# Patient Record
Sex: Female | Born: 1935 | Race: White | Hispanic: No | Marital: Married | Smoking: Former smoker
Health system: Southern US, Community
[De-identification: ages and names within clinical notes are randomized; demographics above are authoritative.]

## PROBLEM LIST (undated history)

## (undated) DIAGNOSIS — I5032 Chronic diastolic (congestive) heart failure: Secondary | ICD-10-CM

## (undated) DIAGNOSIS — Z8639 Personal history of other endocrine, nutritional and metabolic disease: Secondary | ICD-10-CM

## (undated) DIAGNOSIS — I714 Abdominal aortic aneurysm, without rupture, unspecified: Secondary | ICD-10-CM

## (undated) DIAGNOSIS — H353290 Exudative age-related macular degeneration, unspecified eye, stage unspecified: Secondary | ICD-10-CM

## (undated) DIAGNOSIS — L309 Dermatitis, unspecified: Secondary | ICD-10-CM

## (undated) DIAGNOSIS — E1340 Other specified diabetes mellitus with diabetic neuropathy, unspecified: Secondary | ICD-10-CM

## (undated) DIAGNOSIS — L84 Corns and callosities: Secondary | ICD-10-CM

## (undated) DIAGNOSIS — E213 Hyperparathyroidism, unspecified: Secondary | ICD-10-CM

## (undated) DIAGNOSIS — M109 Gout, unspecified: Secondary | ICD-10-CM

## (undated) DIAGNOSIS — B351 Tinea unguium: Secondary | ICD-10-CM

## (undated) DIAGNOSIS — I10 Essential (primary) hypertension: Secondary | ICD-10-CM

## (undated) DIAGNOSIS — M1990 Unspecified osteoarthritis, unspecified site: Secondary | ICD-10-CM

## (undated) DIAGNOSIS — K649 Unspecified hemorrhoids: Secondary | ICD-10-CM

## (undated) DIAGNOSIS — T466X5A Adverse effect of antihyperlipidemic and antiarteriosclerotic drugs, initial encounter: Secondary | ICD-10-CM

## (undated) DIAGNOSIS — G720 Drug-induced myopathy: Secondary | ICD-10-CM

## (undated) DIAGNOSIS — M545 Low back pain, unspecified: Secondary | ICD-10-CM

## (undated) DIAGNOSIS — K219 Gastro-esophageal reflux disease without esophagitis: Secondary | ICD-10-CM

## (undated) DIAGNOSIS — E7800 Pure hypercholesterolemia, unspecified: Secondary | ICD-10-CM

## (undated) DIAGNOSIS — K5790 Diverticulosis of intestine, part unspecified, without perforation or abscess without bleeding: Secondary | ICD-10-CM

## (undated) DIAGNOSIS — N1831 Chronic kidney disease, stage 3a: Secondary | ICD-10-CM

## (undated) DIAGNOSIS — H25019 Cortical age-related cataract, unspecified eye: Secondary | ICD-10-CM

## (undated) DIAGNOSIS — E039 Hypothyroidism, unspecified: Secondary | ICD-10-CM

## (undated) DIAGNOSIS — E119 Type 2 diabetes mellitus without complications: Secondary | ICD-10-CM

## (undated) DIAGNOSIS — E538 Deficiency of other specified B group vitamins: Secondary | ICD-10-CM

## (undated) DIAGNOSIS — Z8619 Personal history of other infectious and parasitic diseases: Secondary | ICD-10-CM

## (undated) DIAGNOSIS — C801 Malignant (primary) neoplasm, unspecified: Secondary | ICD-10-CM

## (undated) DIAGNOSIS — C4491 Basal cell carcinoma of skin, unspecified: Secondary | ICD-10-CM

## (undated) DIAGNOSIS — T7840XA Allergy, unspecified, initial encounter: Secondary | ICD-10-CM

## (undated) DIAGNOSIS — R32 Unspecified urinary incontinence: Secondary | ICD-10-CM

## (undated) DIAGNOSIS — D649 Anemia, unspecified: Secondary | ICD-10-CM

## (undated) DIAGNOSIS — E1140 Type 2 diabetes mellitus with diabetic neuropathy, unspecified: Secondary | ICD-10-CM

## (undated) HISTORY — DX: Chronic kidney disease, stage 3a: N18.31

## (undated) HISTORY — PX: KNEE ARTHROSCOPY: SUR90

## (undated) HISTORY — DX: Chronic diastolic (congestive) heart failure: I50.32

## (undated) HISTORY — DX: Abdominal aortic aneurysm, without rupture: I71.4

## (undated) HISTORY — DX: Abdominal aortic aneurysm, without rupture, unspecified: I71.40

## (undated) HISTORY — PX: DIAGNOSTIC LAPAROSCOPY: SUR761

## (undated) HISTORY — PX: MOUTH SURGERY: SHX715

## (undated) HISTORY — PX: EYE SURGERY: SHX253

## (undated) HISTORY — DX: Exudative age-related macular degeneration, unspecified eye, stage unspecified: H35.3290

## (undated) HISTORY — PX: OTHER SURGICAL HISTORY: SHX169

## (undated) HISTORY — PX: ESOPHAGOGASTRODUODENOSCOPY: SHX1529

## (undated) HISTORY — PX: TONSILLECTOMY: SUR1361

## (undated) HISTORY — PX: COLONOSCOPY: SHX174

---

## 1999-04-13 HISTORY — PX: PARATHYROIDECTOMY: SHX19

## 2002-04-12 DIAGNOSIS — G720 Drug-induced myopathy: Secondary | ICD-10-CM

## 2002-04-12 HISTORY — DX: Drug-induced myopathy: G72.0

## 2004-04-12 HISTORY — PX: BUNIONECTOMY: SHX129

## 2004-08-18 ENCOUNTER — Ambulatory Visit: Payer: Self-pay

## 2004-09-10 ENCOUNTER — Ambulatory Visit: Payer: Self-pay

## 2004-10-10 ENCOUNTER — Ambulatory Visit: Payer: Self-pay

## 2006-02-02 ENCOUNTER — Ambulatory Visit: Payer: Self-pay

## 2006-02-10 ENCOUNTER — Ambulatory Visit: Payer: Self-pay

## 2006-03-12 ENCOUNTER — Ambulatory Visit: Payer: Self-pay

## 2008-12-11 HISTORY — PX: MOHS SURGERY: SHX181

## 2008-12-18 ENCOUNTER — Ambulatory Visit: Payer: Self-pay | Admitting: *Deleted

## 2009-01-10 ENCOUNTER — Ambulatory Visit: Payer: Self-pay | Admitting: *Deleted

## 2009-02-10 ENCOUNTER — Ambulatory Visit: Payer: Self-pay | Admitting: *Deleted

## 2009-12-03 ENCOUNTER — Ambulatory Visit: Payer: Self-pay | Admitting: Ophthalmology

## 2010-03-02 ENCOUNTER — Ambulatory Visit: Payer: Self-pay | Admitting: Gastroenterology

## 2010-03-03 ENCOUNTER — Ambulatory Visit: Payer: Self-pay | Admitting: Gastroenterology

## 2010-03-03 LAB — PATHOLOGY REPORT

## 2010-03-10 ENCOUNTER — Ambulatory Visit: Payer: Self-pay | Admitting: Surgery

## 2010-03-11 ENCOUNTER — Ambulatory Visit: Payer: Self-pay | Admitting: Anesthesiology

## 2010-03-16 ENCOUNTER — Inpatient Hospital Stay: Payer: Self-pay | Admitting: Surgery

## 2010-03-16 HISTORY — PX: LAPAROSCOPIC PARTIAL COLECTOMY: SHX5907

## 2010-03-30 ENCOUNTER — Ambulatory Visit: Payer: Self-pay | Admitting: Oncology

## 2010-04-10 ENCOUNTER — Ambulatory Visit: Payer: Self-pay | Admitting: Surgery

## 2010-04-12 ENCOUNTER — Ambulatory Visit: Payer: Self-pay | Admitting: Oncology

## 2010-04-17 ENCOUNTER — Emergency Department: Payer: Self-pay | Admitting: Unknown Physician Specialty

## 2010-04-19 ENCOUNTER — Ambulatory Visit: Payer: Self-pay | Admitting: Oncology

## 2010-05-13 ENCOUNTER — Ambulatory Visit: Payer: Self-pay | Admitting: Oncology

## 2010-06-11 ENCOUNTER — Ambulatory Visit: Payer: Self-pay | Admitting: Oncology

## 2010-07-12 ENCOUNTER — Ambulatory Visit: Payer: Self-pay | Admitting: Oncology

## 2010-08-04 LAB — CEA: CEA: 5.1 ng/mL — ABNORMAL HIGH (ref 0.0–4.7)

## 2010-08-11 ENCOUNTER — Ambulatory Visit: Payer: Self-pay | Admitting: Oncology

## 2010-09-11 ENCOUNTER — Ambulatory Visit: Payer: Self-pay | Admitting: Oncology

## 2010-10-11 ENCOUNTER — Ambulatory Visit: Payer: Self-pay | Admitting: Oncology

## 2010-11-11 ENCOUNTER — Ambulatory Visit: Payer: Self-pay | Admitting: Oncology

## 2010-12-12 ENCOUNTER — Ambulatory Visit: Payer: Self-pay | Admitting: Oncology

## 2011-01-13 ENCOUNTER — Ambulatory Visit: Payer: Self-pay | Admitting: Internal Medicine

## 2011-01-14 ENCOUNTER — Ambulatory Visit: Payer: Self-pay | Admitting: Oncology

## 2011-02-11 ENCOUNTER — Ambulatory Visit: Payer: Self-pay | Admitting: Oncology

## 2011-02-18 LAB — CEA: CEA: 4.3 ng/mL (ref 0.0–4.7)

## 2011-03-02 IMAGING — CR DG FEMUR 2V*L*
1 series · 4 of 4 positions shown · non-contrast
Comparison: none

REASON FOR EXAM: pain left leg
COMMENTS:

[Series 1: view not recorded · 0.17mm/px · 4 of 4 slices shown]
[im 1/4]
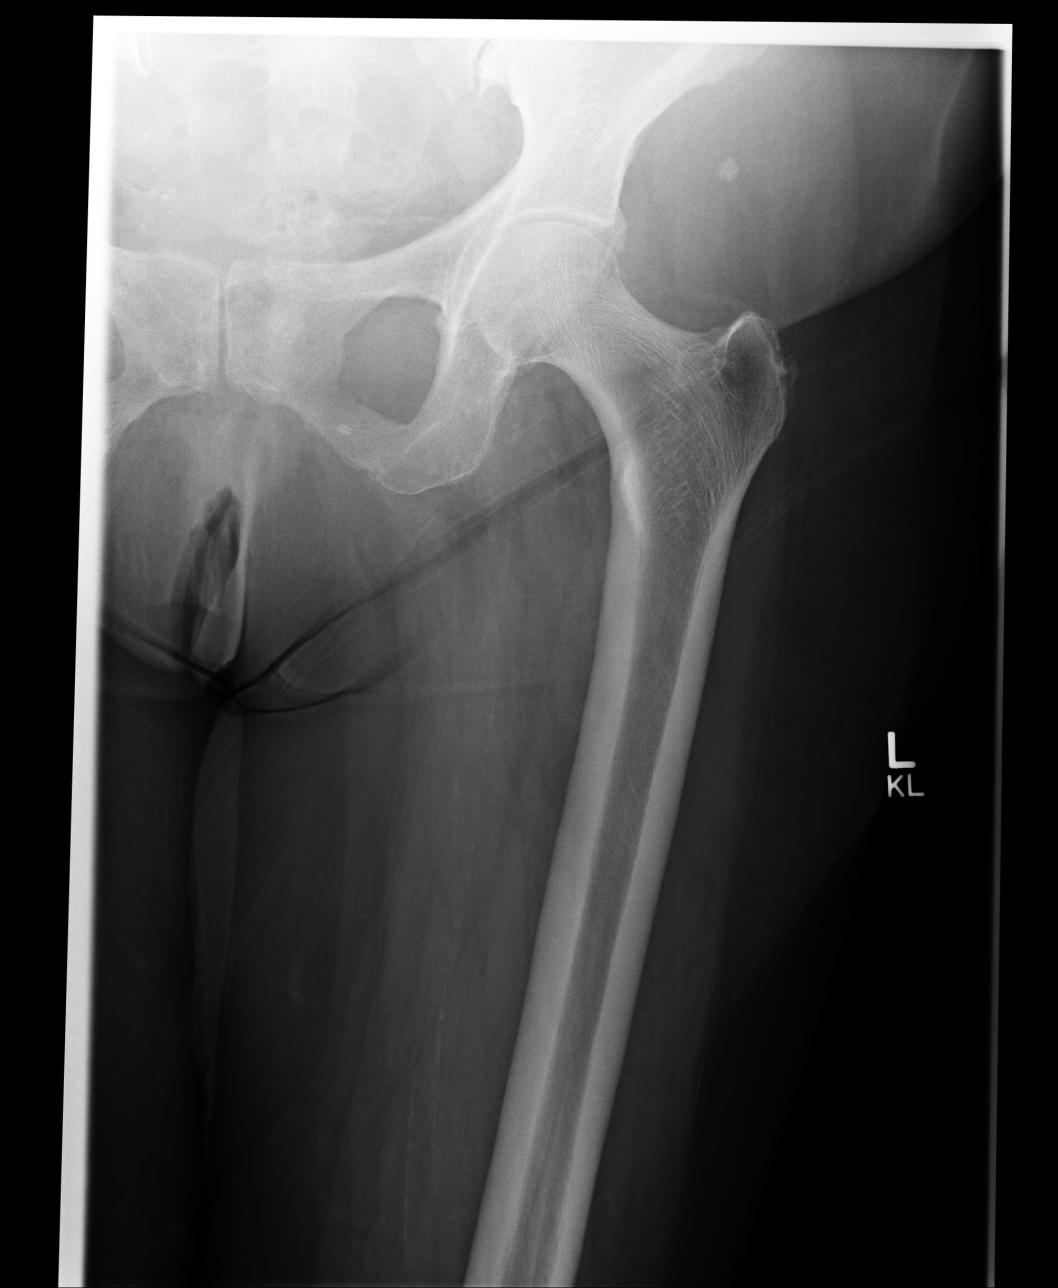
[im 2/4]
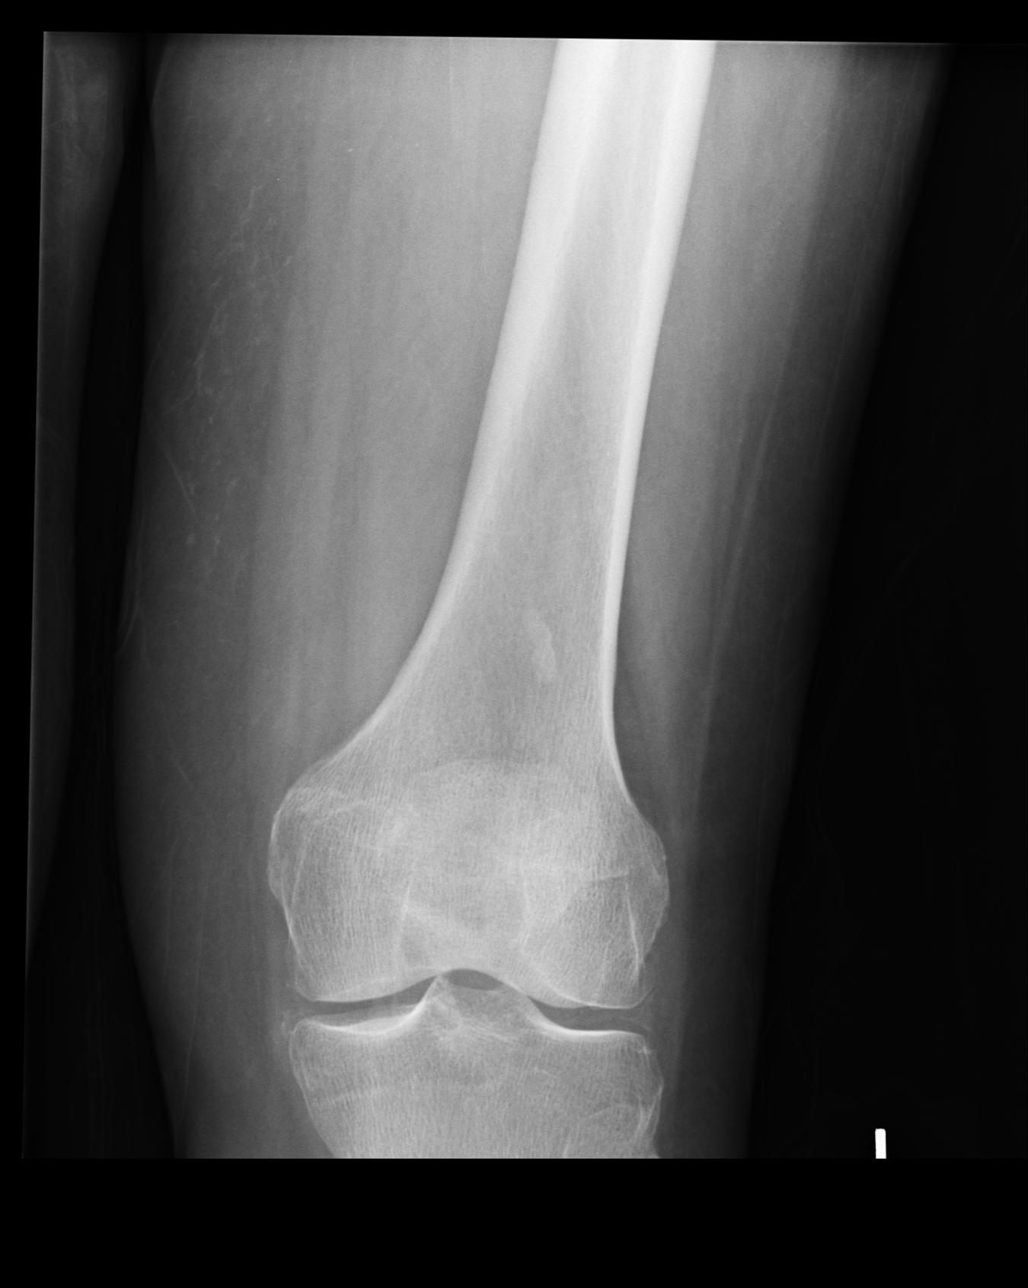
[im 3/4]
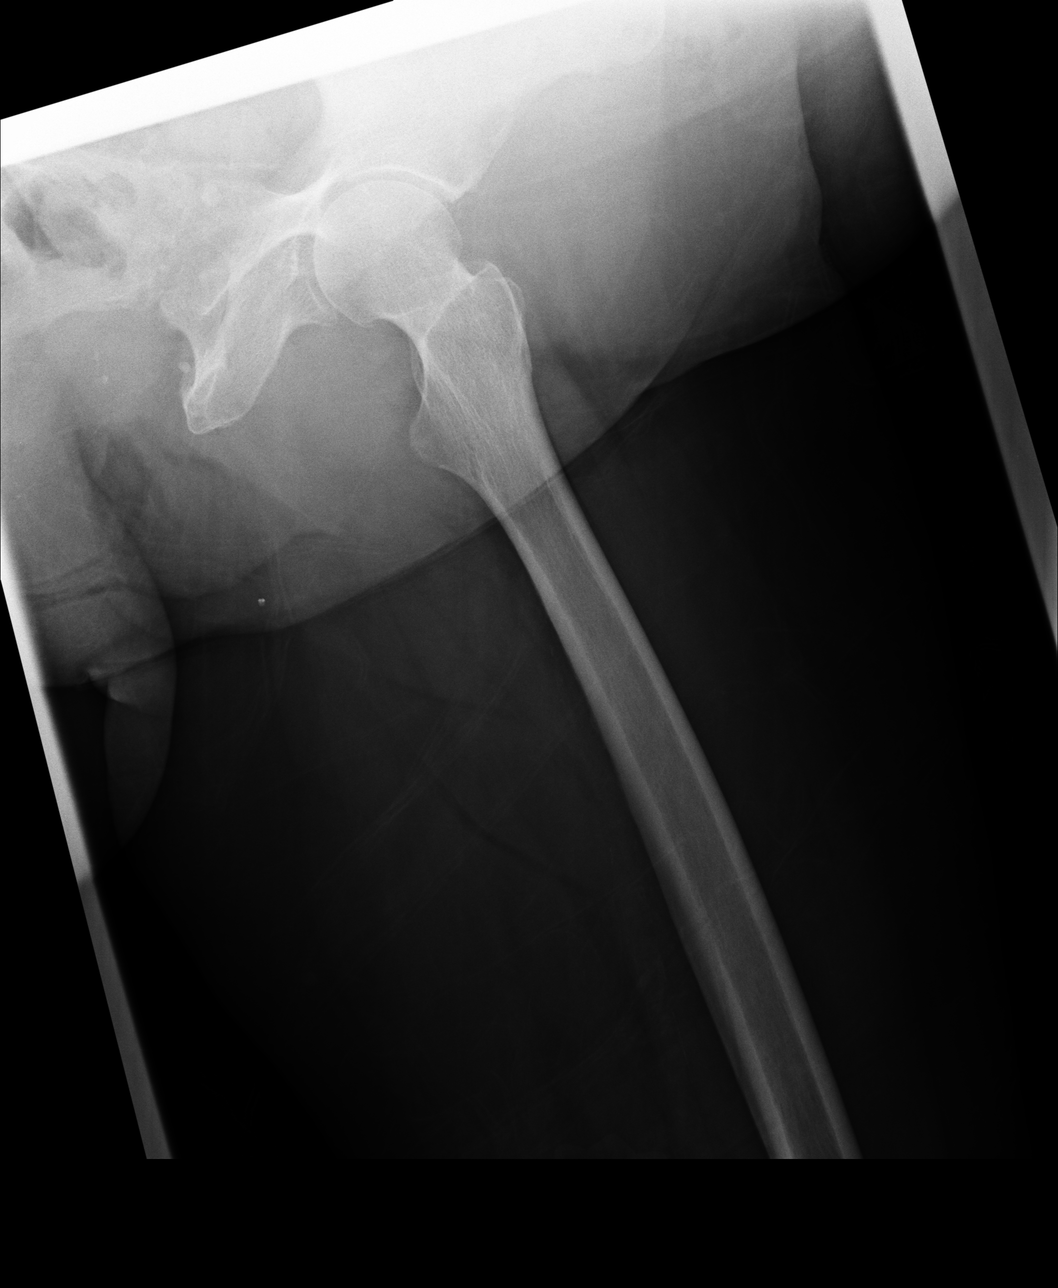
[im 4/4]
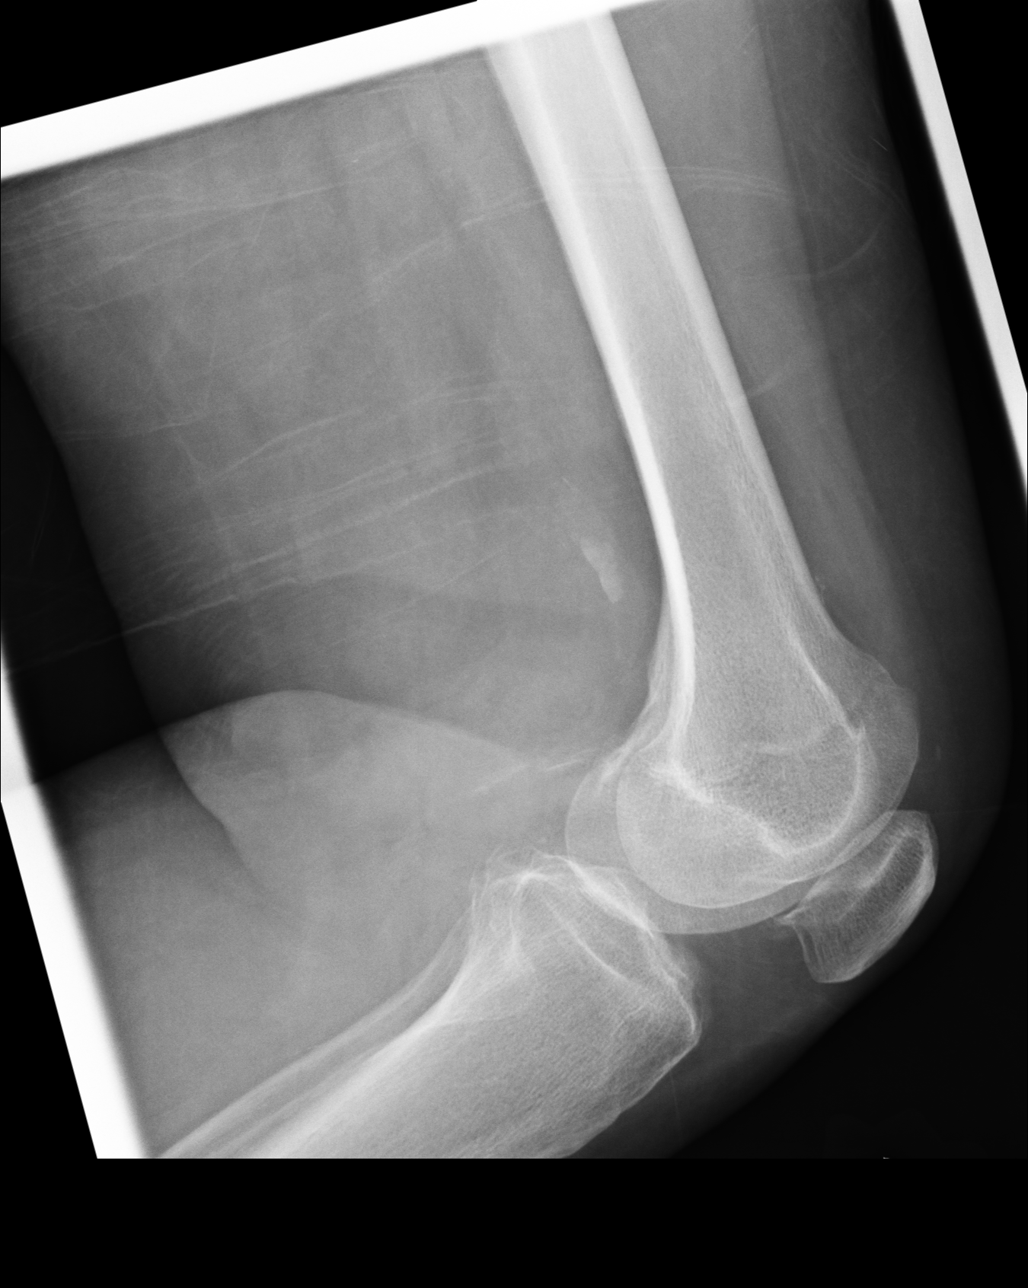

[4 of 4 positions shown; findings below may reference images not displayed]

PROCEDURE:     DXR - DXR FEMUR LEFT  - April 17, 2010 [DATE]

RESULT:     Images of the left demonstrate lucency in the supracondylar
region of the distal femoral shaft consistent with a nondisplaced and
non-comminuted-fracture. Orthopedic surgical followup is recommended. The
patella appears intact.
IMPRESSION: Lucency demonstrated in the distal femur consistent with a
nondisplaced fracture.(*)

## 2011-03-13 ENCOUNTER — Ambulatory Visit: Payer: Self-pay | Admitting: Oncology

## 2011-04-13 ENCOUNTER — Ambulatory Visit: Payer: Self-pay | Admitting: Oncology

## 2011-05-13 ENCOUNTER — Other Ambulatory Visit: Payer: Self-pay | Admitting: Gastroenterology

## 2011-05-14 ENCOUNTER — Ambulatory Visit: Payer: Self-pay | Admitting: Oncology

## 2011-05-14 LAB — WBCS, STOOL

## 2011-05-15 LAB — STOOL CULTURE

## 2011-05-18 LAB — COMPREHENSIVE METABOLIC PANEL
Albumin: 3.7 g/dL (ref 3.4–5.0)
BUN: 19 mg/dL — ABNORMAL HIGH (ref 7–18)
Bilirubin,Total: 0.5 mg/dL (ref 0.2–1.0)
Calcium, Total: 8.9 mg/dL (ref 8.5–10.1)
Co2: 31 mmol/L (ref 21–32)
Creatinine: 1.16 mg/dL (ref 0.60–1.30)
EGFR (African American): 58 — ABNORMAL LOW
EGFR (Non-African Amer.): 48 — ABNORMAL LOW
Glucose: 194 mg/dL — ABNORMAL HIGH (ref 65–99)
Osmolality: 289 (ref 275–301)
SGPT (ALT): 21 U/L

## 2011-05-18 LAB — CBC CANCER CENTER
Basophil #: 0 x10 3/mm (ref 0.0–0.1)
Eosinophil #: 0.3 x10 3/mm (ref 0.0–0.7)
HCT: 39.6 % (ref 35.0–47.0)
Lymphocyte %: 21.2 %
MCH: 29.9 pg (ref 26.0–34.0)
MCHC: 33.2 g/dL (ref 32.0–36.0)
MCV: 90 fL (ref 80–100)
Monocyte #: 0.4 x10 3/mm (ref 0.0–0.7)
Neutrophil #: 5.3 x10 3/mm (ref 1.4–6.5)
Platelet: 130 x10 3/mm — ABNORMAL LOW (ref 150–440)
RDW: 13.1 % (ref 11.5–14.5)

## 2011-05-18 LAB — MAGNESIUM: Magnesium: 1.4 mg/dL — ABNORMAL LOW

## 2011-06-11 ENCOUNTER — Ambulatory Visit: Payer: Self-pay | Admitting: Oncology

## 2011-06-29 ENCOUNTER — Ambulatory Visit: Payer: Self-pay | Admitting: Gastroenterology

## 2011-07-01 LAB — PATHOLOGY REPORT

## 2011-07-12 ENCOUNTER — Ambulatory Visit: Payer: Self-pay | Admitting: Oncology

## 2011-08-12 ENCOUNTER — Ambulatory Visit: Payer: Self-pay | Admitting: Oncology

## 2011-08-17 LAB — BASIC METABOLIC PANEL
Calcium, Total: 8.8 mg/dL (ref 8.5–10.1)
Co2: 28 mmol/L (ref 21–32)
EGFR (African American): 55 — ABNORMAL LOW
EGFR (Non-African Amer.): 48 — ABNORMAL LOW
Glucose: 191 mg/dL — ABNORMAL HIGH (ref 65–99)
Potassium: 4.3 mmol/L (ref 3.5–5.1)
Sodium: 143 mmol/L (ref 136–145)

## 2011-08-17 LAB — CBC CANCER CENTER
Basophil #: 0 x10 3/mm (ref 0.0–0.1)
Basophil %: 0.5 %
Eosinophil #: 0.4 x10 3/mm (ref 0.0–0.7)
Eosinophil %: 4.2 %
HCT: 42 % (ref 35.0–47.0)
Lymphocyte #: 1.9 x10 3/mm (ref 1.0–3.6)
Lymphocyte %: 21.3 %
MCH: 29.4 pg (ref 26.0–34.0)
MCV: 92 fL (ref 80–100)
Neutrophil #: 6.1 x10 3/mm (ref 1.4–6.5)
Neutrophil %: 68.9 %
Platelet: 130 x10 3/mm — ABNORMAL LOW (ref 150–440)
RDW: 13.1 % (ref 11.5–14.5)
WBC: 8.8 x10 3/mm (ref 3.6–11.0)

## 2011-08-18 LAB — CEA: CEA: 3 ng/mL (ref 0.0–4.7)

## 2011-09-11 ENCOUNTER — Ambulatory Visit: Payer: Self-pay | Admitting: Oncology

## 2011-10-13 ENCOUNTER — Ambulatory Visit: Payer: Self-pay | Admitting: Oncology

## 2011-11-11 ENCOUNTER — Ambulatory Visit: Payer: Self-pay | Admitting: Oncology

## 2011-11-30 LAB — COMPREHENSIVE METABOLIC PANEL
Alkaline Phosphatase: 109 U/L (ref 50–136)
Bilirubin,Total: 0.5 mg/dL (ref 0.2–1.0)
Calcium, Total: 8.8 mg/dL (ref 8.5–10.1)
Chloride: 103 mmol/L (ref 98–107)
Co2: 28 mmol/L (ref 21–32)
EGFR (African American): 56 — ABNORMAL LOW
EGFR (Non-African Amer.): 48 — ABNORMAL LOW
Glucose: 107 mg/dL — ABNORMAL HIGH (ref 65–99)
Potassium: 4.4 mmol/L (ref 3.5–5.1)
SGOT(AST): 27 U/L (ref 15–37)
SGPT (ALT): 25 U/L (ref 12–78)

## 2011-11-30 LAB — CBC CANCER CENTER
Basophil %: 0.3 %
Eosinophil %: 3.5 %
HCT: 40 % (ref 35.0–47.0)
HGB: 13 g/dL (ref 12.0–16.0)
Lymphocyte #: 2.1 x10 3/mm (ref 1.0–3.6)
Lymphocyte %: 21.4 %
MCV: 92 fL (ref 80–100)
Monocyte %: 6.1 %
Neutrophil #: 6.6 x10 3/mm — ABNORMAL HIGH (ref 1.4–6.5)
Platelet: 126 x10 3/mm — ABNORMAL LOW (ref 150–440)
WBC: 9.6 x10 3/mm (ref 3.6–11.0)

## 2011-12-01 LAB — CEA: CEA: 2.6 ng/mL (ref 0.0–4.7)

## 2011-12-06 DIAGNOSIS — Z85828 Personal history of other malignant neoplasm of skin: Secondary | ICD-10-CM | POA: Insufficient documentation

## 2011-12-12 ENCOUNTER — Ambulatory Visit: Payer: Self-pay | Admitting: Oncology

## 2012-05-31 ENCOUNTER — Ambulatory Visit: Payer: Self-pay | Admitting: Oncology

## 2012-06-05 LAB — CBC CANCER CENTER
Basophil %: 0.7 %
Eosinophil %: 3.7 %
HGB: 12.6 g/dL (ref 12.0–16.0)
MCH: 29.8 pg (ref 26.0–34.0)
Monocyte #: 0.4 x10 3/mm (ref 0.2–0.9)
Neutrophil #: 5.7 x10 3/mm (ref 1.4–6.5)
Neutrophil %: 69.4 %
WBC: 8.3 x10 3/mm (ref 3.6–11.0)

## 2012-06-05 LAB — COMPREHENSIVE METABOLIC PANEL
Albumin: 3.5 g/dL (ref 3.4–5.0)
Alkaline Phosphatase: 112 U/L (ref 50–136)
Anion Gap: 7 (ref 7–16)
BUN: 27 mg/dL — ABNORMAL HIGH (ref 7–18)
EGFR (African American): 48 — ABNORMAL LOW
Glucose: 321 mg/dL — ABNORMAL HIGH (ref 65–99)
Osmolality: 295 (ref 275–301)
Potassium: 5.1 mmol/L (ref 3.5–5.1)
SGPT (ALT): 25 U/L (ref 12–78)
Sodium: 139 mmol/L (ref 136–145)

## 2012-06-05 LAB — MAGNESIUM: Magnesium: 1.6 mg/dL — ABNORMAL LOW

## 2012-06-06 LAB — CEA: CEA: 3.1 ng/mL (ref 0.0–4.7)

## 2012-06-10 ENCOUNTER — Ambulatory Visit: Payer: Self-pay | Admitting: Oncology

## 2012-07-05 ENCOUNTER — Ambulatory Visit: Payer: Self-pay | Admitting: Internal Medicine

## 2012-07-10 ENCOUNTER — Ambulatory Visit: Payer: Self-pay | Admitting: Internal Medicine

## 2012-10-14 ENCOUNTER — Other Ambulatory Visit: Payer: Self-pay | Admitting: Gastroenterology

## 2012-11-06 ENCOUNTER — Ambulatory Visit: Payer: Self-pay | Admitting: Gastroenterology

## 2012-11-30 ENCOUNTER — Ambulatory Visit: Payer: Self-pay | Admitting: Oncology

## 2012-12-01 LAB — CBC CANCER CENTER
Basophil %: 0.7 %
HCT: 38.4 % (ref 35.0–47.0)
HGB: 13 g/dL (ref 12.0–16.0)
Lymphocyte #: 1.5 x10 3/mm (ref 1.0–3.6)
MCH: 30.3 pg (ref 26.0–34.0)
MCHC: 33.9 g/dL (ref 32.0–36.0)
MCV: 89 fL (ref 80–100)
Monocyte %: 4.6 %
Neutrophil #: 6.1 x10 3/mm (ref 1.4–6.5)
RDW: 13.1 % (ref 11.5–14.5)
WBC: 8.4 x10 3/mm (ref 3.6–11.0)

## 2012-12-01 LAB — COMPREHENSIVE METABOLIC PANEL
Albumin: 3.6 g/dL (ref 3.4–5.0)
Calcium, Total: 8.6 mg/dL (ref 8.5–10.1)
Chloride: 104 mmol/L (ref 98–107)
Co2: 25 mmol/L (ref 21–32)
Glucose: 253 mg/dL — ABNORMAL HIGH (ref 65–99)
Osmolality: 291 (ref 275–301)
Potassium: 4.5 mmol/L (ref 3.5–5.1)
SGOT(AST): 22 U/L (ref 15–37)
Total Protein: 7 g/dL (ref 6.4–8.2)

## 2012-12-02 LAB — CEA: CEA: 2.7 ng/mL (ref 0.0–4.7)

## 2012-12-11 ENCOUNTER — Ambulatory Visit: Payer: Self-pay | Admitting: Oncology

## 2013-06-11 ENCOUNTER — Ambulatory Visit: Payer: Self-pay | Admitting: Oncology

## 2013-06-11 LAB — CBC CANCER CENTER
BASOS ABS: 0.1 x10 3/mm (ref 0.0–0.1)
BASOS PCT: 0.9 %
Eosinophil #: 0.2 x10 3/mm (ref 0.0–0.7)
Eosinophil %: 2.6 %
HCT: 38.6 % (ref 35.0–47.0)
HGB: 12 g/dL (ref 12.0–16.0)
LYMPHS PCT: 18.5 %
Lymphocyte #: 1.4 x10 3/mm (ref 1.0–3.6)
MCH: 27.6 pg (ref 26.0–34.0)
MCHC: 31.2 g/dL — ABNORMAL LOW (ref 32.0–36.0)
MCV: 88 fL (ref 80–100)
MONO ABS: 0.4 x10 3/mm (ref 0.2–0.9)
MONOS PCT: 4.6 %
NEUTROS PCT: 73.4 %
Neutrophil #: 5.6 x10 3/mm (ref 1.4–6.5)
Platelet: 150 x10 3/mm (ref 150–440)
RBC: 4.36 10*6/uL (ref 3.80–5.20)
RDW: 13.2 % (ref 11.5–14.5)
WBC: 7.7 x10 3/mm (ref 3.6–11.0)

## 2013-06-11 LAB — MAGNESIUM: Magnesium: 2 mg/dL

## 2013-06-22 LAB — CEA: CEA: 3.1 ng/mL (ref 0.0–4.7)

## 2013-07-11 ENCOUNTER — Ambulatory Visit: Payer: Self-pay | Admitting: Oncology

## 2013-12-07 ENCOUNTER — Ambulatory Visit: Payer: Self-pay | Admitting: Oncology

## 2013-12-07 LAB — CBC CANCER CENTER
BASOS ABS: 0.1 x10 3/mm (ref 0.0–0.1)
Basophil %: 1.1 %
Eosinophil #: 0.2 x10 3/mm (ref 0.0–0.7)
Eosinophil %: 2.4 %
HCT: 37.6 % (ref 35.0–47.0)
HGB: 12.1 g/dL (ref 12.0–16.0)
Lymphocyte #: 1.4 x10 3/mm (ref 1.0–3.6)
Lymphocyte %: 17 %
MCH: 28 pg (ref 26.0–34.0)
MCHC: 32.1 g/dL (ref 32.0–36.0)
MCV: 87 fL (ref 80–100)
MONO ABS: 0.3 x10 3/mm (ref 0.2–0.9)
Monocyte %: 4 %
NEUTROS PCT: 75.5 %
Neutrophil #: 6.4 x10 3/mm (ref 1.4–6.5)
Platelet: 138 x10 3/mm — ABNORMAL LOW (ref 150–440)
RBC: 4.3 10*6/uL (ref 3.80–5.20)
RDW: 13.6 % (ref 11.5–14.5)
WBC: 8.5 x10 3/mm (ref 3.6–11.0)

## 2013-12-07 LAB — COMPREHENSIVE METABOLIC PANEL
ALBUMIN: 3.5 g/dL (ref 3.4–5.0)
ALT: 23 U/L
ANION GAP: 12 (ref 7–16)
Alkaline Phosphatase: 108 U/L
BILIRUBIN TOTAL: 0.5 mg/dL (ref 0.2–1.0)
BUN: 16 mg/dL (ref 7–18)
CO2: 26 mmol/L (ref 21–32)
CREATININE: 1.24 mg/dL (ref 0.60–1.30)
Calcium, Total: 8.5 mg/dL (ref 8.5–10.1)
Chloride: 103 mmol/L (ref 98–107)
EGFR (African American): 48 — ABNORMAL LOW
GFR CALC NON AF AMER: 41 — AB
Glucose: 242 mg/dL — ABNORMAL HIGH (ref 65–99)
Osmolality: 290 (ref 275–301)
POTASSIUM: 4.3 mmol/L (ref 3.5–5.1)
SGOT(AST): 28 U/L (ref 15–37)
SODIUM: 141 mmol/L (ref 136–145)
Total Protein: 7.1 g/dL (ref 6.4–8.2)

## 2013-12-07 LAB — MAGNESIUM: Magnesium: 1.4 mg/dL — ABNORMAL LOW

## 2013-12-10 LAB — CEA: CEA: 2.9 ng/mL (ref 0.0–4.7)

## 2013-12-11 ENCOUNTER — Ambulatory Visit: Payer: Self-pay | Admitting: Oncology

## 2013-12-20 DIAGNOSIS — Z85038 Personal history of other malignant neoplasm of large intestine: Secondary | ICD-10-CM | POA: Insufficient documentation

## 2013-12-20 DIAGNOSIS — K529 Noninfective gastroenteritis and colitis, unspecified: Secondary | ICD-10-CM | POA: Insufficient documentation

## 2014-01-02 DIAGNOSIS — I10 Essential (primary) hypertension: Secondary | ICD-10-CM | POA: Insufficient documentation

## 2014-01-02 DIAGNOSIS — E7800 Pure hypercholesterolemia, unspecified: Secondary | ICD-10-CM | POA: Insufficient documentation

## 2014-01-02 DIAGNOSIS — M545 Low back pain, unspecified: Secondary | ICD-10-CM | POA: Insufficient documentation

## 2014-06-07 ENCOUNTER — Ambulatory Visit: Payer: Self-pay | Admitting: Oncology

## 2014-06-11 ENCOUNTER — Ambulatory Visit
Admit: 2014-06-11 | Disposition: A | Discharge status: Home / Self Care | Payer: Self-pay | Attending: Oncology | Admitting: Oncology

## 2014-08-02 DIAGNOSIS — E1149 Type 2 diabetes mellitus with other diabetic neurological complication: Secondary | ICD-10-CM | POA: Insufficient documentation

## 2014-08-02 DIAGNOSIS — E1159 Type 2 diabetes mellitus with other circulatory complications: Secondary | ICD-10-CM | POA: Insufficient documentation

## 2014-08-07 ENCOUNTER — Ambulatory Visit
Admit: 2014-08-07 | Disposition: A | Discharge status: Home / Self Care | Payer: Self-pay | Attending: Internal Medicine | Admitting: Internal Medicine

## 2014-11-29 ENCOUNTER — Telehealth: Payer: Self-pay | Admitting: *Deleted

## 2014-11-29 DIAGNOSIS — R6889 Other general symptoms and signs: Secondary | ICD-10-CM

## 2014-11-29 DIAGNOSIS — C189 Malignant neoplasm of colon, unspecified: Secondary | ICD-10-CM

## 2014-11-29 NOTE — Telephone Encounter (Signed)
Has a signed order from Dr Gilford Rile for [facility] to be drawn when she has labs drawn here on 31st. Checked with Dr Grayland Ormond and TSH has been added to labs we have ordered

## 2014-12-06 ENCOUNTER — Encounter: Payer: Self-pay | Admitting: *Deleted

## 2014-12-09 ENCOUNTER — Encounter
Admission: RE | Disposition: A | Discharge status: Home / Self Care | Payer: Self-pay | Source: Ambulatory Visit | Attending: Gastroenterology

## 2014-12-09 ENCOUNTER — Ambulatory Visit: Payer: Medicare Other | Admitting: Anesthesiology

## 2014-12-09 ENCOUNTER — Encounter: Payer: Self-pay | Admitting: *Deleted

## 2014-12-09 ENCOUNTER — Ambulatory Visit
Admission: RE | Admit: 2014-12-09 | Discharge: 2014-12-09 | Disposition: A | Discharge status: Home / Self Care | Payer: Medicare Other | Source: Ambulatory Visit | Attending: Gastroenterology | Admitting: Gastroenterology

## 2014-12-09 DIAGNOSIS — Z6831 Body mass index (BMI) 31.0-31.9, adult: Secondary | ICD-10-CM | POA: Insufficient documentation

## 2014-12-09 DIAGNOSIS — E213 Hyperparathyroidism, unspecified: Secondary | ICD-10-CM | POA: Diagnosis not present

## 2014-12-09 DIAGNOSIS — E669 Obesity, unspecified: Secondary | ICD-10-CM | POA: Diagnosis not present

## 2014-12-09 DIAGNOSIS — Z880 Allergy status to penicillin: Secondary | ICD-10-CM | POA: Insufficient documentation

## 2014-12-09 DIAGNOSIS — Z881 Allergy status to other antibiotic agents status: Secondary | ICD-10-CM | POA: Diagnosis not present

## 2014-12-09 DIAGNOSIS — D122 Benign neoplasm of ascending colon: Secondary | ICD-10-CM | POA: Diagnosis not present

## 2014-12-09 DIAGNOSIS — Z85038 Personal history of other malignant neoplasm of large intestine: Secondary | ICD-10-CM | POA: Insufficient documentation

## 2014-12-09 DIAGNOSIS — M109 Gout, unspecified: Secondary | ICD-10-CM | POA: Diagnosis not present

## 2014-12-09 DIAGNOSIS — D649 Anemia, unspecified: Secondary | ICD-10-CM | POA: Insufficient documentation

## 2014-12-09 DIAGNOSIS — Z1211 Encounter for screening for malignant neoplasm of colon: Secondary | ICD-10-CM | POA: Diagnosis not present

## 2014-12-09 DIAGNOSIS — K5289 Other specified noninfective gastroenteritis and colitis: Secondary | ICD-10-CM | POA: Diagnosis not present

## 2014-12-09 DIAGNOSIS — Z888 Allergy status to other drugs, medicaments and biological substances status: Secondary | ICD-10-CM | POA: Diagnosis not present

## 2014-12-09 DIAGNOSIS — M1990 Unspecified osteoarthritis, unspecified site: Secondary | ICD-10-CM | POA: Diagnosis not present

## 2014-12-09 DIAGNOSIS — I10 Essential (primary) hypertension: Secondary | ICD-10-CM | POA: Insufficient documentation

## 2014-12-09 DIAGNOSIS — E1140 Type 2 diabetes mellitus with diabetic neuropathy, unspecified: Secondary | ICD-10-CM | POA: Insufficient documentation

## 2014-12-09 DIAGNOSIS — D123 Benign neoplasm of transverse colon: Secondary | ICD-10-CM | POA: Diagnosis not present

## 2014-12-09 DIAGNOSIS — K5730 Diverticulosis of large intestine without perforation or abscess without bleeding: Secondary | ICD-10-CM | POA: Diagnosis not present

## 2014-12-09 DIAGNOSIS — K219 Gastro-esophageal reflux disease without esophagitis: Secondary | ICD-10-CM | POA: Diagnosis not present

## 2014-12-09 DIAGNOSIS — K648 Other hemorrhoids: Secondary | ICD-10-CM | POA: Insufficient documentation

## 2014-12-09 DIAGNOSIS — E039 Hypothyroidism, unspecified: Secondary | ICD-10-CM | POA: Insufficient documentation

## 2014-12-09 DIAGNOSIS — E780 Pure hypercholesterolemia: Secondary | ICD-10-CM | POA: Diagnosis not present

## 2014-12-09 DIAGNOSIS — Z794 Long term (current) use of insulin: Secondary | ICD-10-CM | POA: Insufficient documentation

## 2014-12-09 DIAGNOSIS — Z86010 Personal history of colonic polyps: Secondary | ICD-10-CM | POA: Insufficient documentation

## 2014-12-09 HISTORY — DX: Low back pain, unspecified: M54.50

## 2014-12-09 HISTORY — DX: Unspecified urinary incontinence: R32

## 2014-12-09 HISTORY — DX: Low back pain: M54.5

## 2014-12-09 HISTORY — DX: Anemia, unspecified: D64.9

## 2014-12-09 HISTORY — DX: Gout, unspecified: M10.9

## 2014-12-09 HISTORY — DX: Other specified diabetes mellitus with diabetic neuropathy, unspecified: E13.40

## 2014-12-09 HISTORY — DX: Tinea unguium: B35.1

## 2014-12-09 HISTORY — DX: Pure hypercholesterolemia, unspecified: E78.00

## 2014-12-09 HISTORY — DX: Hyperparathyroidism, unspecified: E21.3

## 2014-12-09 HISTORY — DX: Type 2 diabetes mellitus without complications: E11.9

## 2014-12-09 HISTORY — DX: Unspecified osteoarthritis, unspecified site: M19.90

## 2014-12-09 HISTORY — DX: Hypothyroidism, unspecified: E03.9

## 2014-12-09 HISTORY — DX: Drug-induced myopathy: G72.0

## 2014-12-09 HISTORY — DX: Malignant (primary) neoplasm, unspecified: C80.1

## 2014-12-09 HISTORY — DX: Dermatitis, unspecified: L30.9

## 2014-12-09 HISTORY — DX: Gastro-esophageal reflux disease without esophagitis: K21.9

## 2014-12-09 HISTORY — PX: COLONOSCOPY WITH PROPOFOL: SHX5780

## 2014-12-09 HISTORY — DX: Unspecified hemorrhoids: K64.9

## 2014-12-09 LAB — GLUCOSE, CAPILLARY: Glucose-Capillary: 96 mg/dL (ref 65–99)

## 2014-12-09 SURGERY — COLONOSCOPY WITH PROPOFOL
Anesthesia: General

## 2014-12-09 MED ORDER — PROPOFOL 10 MG/ML IV BOLUS
INTRAVENOUS | Status: DC | PRN
  Administered 2014-12-09: 20 mg via INTRAVENOUS
  Administered 2014-12-09: 10 mg via INTRAVENOUS
  Administered 2014-12-09: 20 mg via INTRAVENOUS

## 2014-12-09 MED ORDER — CARVEDILOL 6.25 MG PO TABS
ORAL_TABLET | ORAL | Status: AC
  Administered 2014-12-09: 6.25 mg
  Filled 2014-12-09: qty 1

## 2014-12-09 MED ORDER — FENTANYL CITRATE (PF) 100 MCG/2ML IJ SOLN
INTRAMUSCULAR | Status: DC | PRN
  Administered 2014-12-09 (×2): 25 ug via INTRAVENOUS

## 2014-12-09 MED ORDER — SODIUM CHLORIDE 0.9 % IV SOLN
INTRAVENOUS | Status: DC
Start: 2014-12-09 — End: 2014-12-09

## 2014-12-09 MED ORDER — SODIUM CHLORIDE 0.9 % IV SOLN
INTRAVENOUS | Status: DC
  Administered 2014-12-09: 10:00:00 via INTRAVENOUS
  Administered 2014-12-09: 1000 mL via INTRAVENOUS

## 2014-12-09 MED ORDER — PROPOFOL INFUSION 10 MG/ML OPTIME
INTRAVENOUS | Status: DC | PRN
  Administered 2014-12-09: 100 ug/kg/min via INTRAVENOUS

## 2014-12-09 NOTE — H&P (Signed)
Outpatient short stay form Pre-procedure 12/09/2014 9:17 AM Andrea Sails MD  Primary Physician: Dr. Lisette Grinder  Reason for visit:  Colonoscopy  History of present illness:  Patient is a 81 year old female presenting today for a colonoscopy. She has a personal history of colon cancer that was diagnosed in about 2011. She had a partial right colectomy and chemotherapy. She tolerated her prep well. She takes no aspirin or blood thinning products. Her last colonoscopy was alive 2014 finding of some hyperplastic polyps.    Current facility-administered medications:  .  0.9 %  sodium chloride infusion, , Intravenous, Continuous, Andrea Sails, MD, Last Rate: 20 mL/hr at 12/09/14 0848, 1,000 mL at 12/09/14 0848 .  0.9 %  sodium chloride infusion, , Intravenous, Continuous, Andrea Sails, MD  Prescriptions prior to admission  Medication Sig Dispense Refill Last Dose  . carvedilol (COREG) 6.25 MG tablet Take 6.25 mg by mouth 2 (two) times daily with a meal.     . cholecalciferol (VITAMIN D) 400 UNITS TABS tablet Take 1,000 Units by mouth.     . insulin glargine (LANTUS) 100 UNIT/ML injection Inject into the skin at bedtime.     Marland Kitchen lactase-rennet 25-2 MG tablet Take 1 tablet by mouth 3 (three) times daily with meals.     Marland Kitchen loperamide (IMODIUM) 2 MG capsule Take by mouth as needed for diarrhea or loose stools.     . magnesium oxide (MAG-OX) 400 MG tablet Take 400 mg by mouth daily.     . metFORMIN (GLUCOPHAGE) 500 MG tablet Take by mouth 2 (two) times daily with a meal.     . omeprazole (PRILOSEC) 20 MG capsule Take 20 mg by mouth daily.     Marland Kitchen oxybutynin (DITROPAN) 5 MG tablet Take 5 mg by mouth 2 (two) times daily.        Allergies  Allergen Reactions  . Ace Inhibitors   . Amoxicillin   . Angiotensin Receptor Blockers   . Losartan   . Penicillins   . Simvastatin      Past Medical History  Diagnosis Date  . Hypercholesterolemia   . Lumbago   . Eczema   . GERD  (gastroesophageal reflux disease)   . Hemorrhoids   . Diabetes mellitus without complication   . Neuropathy due to secondary diabetes mellitus   . Hypothyroidism   . Hyperparathyroidism   . Arthritis   . Anemia   . Gout   . Cancer   . Mycotic toenails   . Drug-induced myopathy   . Incontinent of urine     Review of systems:      Physical Exam    Heart and lungs: Regular rate and rhythm without rub or gallop lungs are bilaterally clear    HEENT: Normocephalic atraumatic eyes are anicteric    Other:     Pertinant exam for procedure: Soft nontender nondistended bowel sounds positive normoactive    Planned proceedures: Colonoscopy and indicated procedures I have discussed the risks benefits and complications of procedures to include not limited to bleeding, infection, perforation and the risk of sedation and the patient wishes to proceed.    Andrea Sails, MD Gastroenterology 12/09/2014  9:17 AM

## 2014-12-09 NOTE — Op Note (Signed)
[facility] Gastroenterology Patient Name: Andrea Lawson Procedure Date: 12/09/2014 9:20 AM MRN: [id number] Account #: [id number] Date of Birth: 10/02/1931 Admit Type: Outpatient Age: 81 Room: [facility] ENDO ROOM 2 Gender: Female Note Status: Finalized Procedure:         Colonoscopy Indications:       Personal history of malignant neoplasm of the colon Providers:         Lollie Sails, MD Referring MD:      [name], MD (Referring MD), [name], MD (Referring MD) Medicines:         Monitored Anesthesia Care Complications:     No immediate complications. Procedure:         Pre-Anesthesia Assessment:                    - ASA Grade Assessment: III - A patient with severe                     systemic disease.                    After obtaining informed consent, the colonoscope was                     passed under direct vision. Throughout the procedure, the                     patient's blood pressure, pulse, and oxygen saturations                     were monitored continuously. The Olympus PCF-H180AL                     colonoscope ( S#: [id number] ) was introduced through the                     anus and advanced to the the ileocolonic anastomosis. The                     colonoscopy was unusually difficult due to significant                     looping and a tortuous colon. Successful completion of the                     procedure was aided by changing the patient to a supine                     position, changing the patient to a prone position and                     using manual pressure. The patient tolerated the procedure                     well. The quality of the bowel preparation was fair. Findings:      A 4 mm polyp was found in the transverse colon. The polyp was flat. The       polyp was removed with a cold biopsy forceps. Resection and retrieval       were complete.      A 3 mm polyp was found in the distal  ascending colon. The polyp  was       sessile. The polyp was removed with a cold biopsy forceps. Resection and       retrieval were complete.      There was evidence of a prior functional end-to-end ileo-colonic       anastomosis in the proximal ascending colon. This was patent. This was       characterized by inflammation. This was traversed. This was biopsied       with a cold forceps for histology.      Multiple small-mouthed diverticula were found in the sigmoid colon and       in the descending colon.      Non-bleeding internal hemorrhoids were found during retroflexion and       during anoscopy. The hemorrhoids were small.      No additional abnormalities were found on retroflexion. Impression:        - One 4 mm polyp in the transverse colon. Resected and                     retrieved.                    - One 3 mm polyp in the distal ascending colon. Resected                     and retrieved.                    - Patent functional end-to-end ileo-colonic anastomosis,                     characterized by inflammation. Biopsied.                    - Diverticulosis in the sigmoid colon and in the                     descending colon.                    - Non-bleeding internal hemorrhoids. Recommendation:    - Await pathology results.                    - Telephone GI clinic for pathology results in 1 week. Procedure Code(s): --- Professional ---                    [phone], Colonoscopy, flexible; with biopsy, single or                     multiple Diagnosis Code(s): --- Professional ---                    211.3, Benign neoplasm of colon                    455.0, Internal hemorrhoids without mention of complication                    V10.05, Personal history of malignant neoplasm of large                     intestine                    V45.3, Intestinal bypass or anastomosis status                    562.10, Diverticulosis  of colon (without mention of                      hemorrhage) CPT copyright 2014 American Medical Association. All rights reserved. The codes documented in this report are preliminary and upon coder review may  be revised to meet current compliance requirements. Lollie Sails, MD 12/09/2014 10:34:05 AM This report has been signed electronically. Number of Addenda: 0 Note Initiated On: 12/09/2014 9:20 AM Scope Withdrawal Time: 0 hours 12 minutes 7 seconds  Total Procedure Duration: 0 hours 31 minutes 24 seconds       [facility]

## 2014-12-09 NOTE — Transfer of Care (Signed)
Immediate Anesthesia Transfer of Care Note  Patient: Andrea Lawson  Procedure(s) Performed: Procedure(s): COLONOSCOPY WITH PROPOFOL (N/A)  Patient Location: PACU  Anesthesia Type:General  Level of Consciousness: awake, alert  and oriented  Airway & Oxygen Therapy: Patient Spontanous Breathing and Patient connected to nasal cannula oxygen  Post-op Assessment: Report given to RN and Post -op Vital signs reviewed and stable  Post vital signs: Reviewed and stable  Last Vitals:  Filed Vitals:   12/09/14 0826  BP: 206/86  Pulse: 92  Temp: 36.9 C  Resp: 20    Complications: No apparent anesthesia complications

## 2014-12-09 NOTE — Anesthesia Postprocedure Evaluation (Signed)
  Anesthesia Post-op Note  Patient: Andrea Lawson  Procedure(s) Performed: Procedure(s): COLONOSCOPY WITH PROPOFOL (N/A)  Anesthesia type:General  Patient location: PACU  Post pain: Pain level controlled  Post assessment: Post-op Vital signs reviewed, Patient's Cardiovascular Status Stable, Respiratory Function Stable, Patent Airway and No signs of Nausea or vomiting  Post vital signs: Reviewed and stable  Last Vitals:  Filed Vitals:   12/09/14 1055  BP: 134/72  Pulse: 126  Temp:   Resp: 18    Level of consciousness: awake, alert  and patient cooperative  Complications: No apparent anesthesia complications

## 2014-12-09 NOTE — Anesthesia Preprocedure Evaluation (Signed)
Anesthesia Evaluation  Patient identified by MRN, date of birth, ID band Patient awake    Reviewed: Allergy & Precautions, NPO status , Patient's Chart, lab work & pertinent test results  History of Anesthesia Complications Negative for: history of anesthetic complications  Airway Mallampati: II       Dental no notable dental hx.    Pulmonary neg pulmonary ROS,    Pulmonary exam normal       Cardiovascular Exercise Tolerance: Good METShypertension, Pt. on home beta blockers Normal cardiovascular exam    Neuro/Psych    GI/Hepatic Neg liver ROS, GERD-  Medicated,  Endo/Other  diabetes, Well Controlled, Type 1, Insulin DependentHypothyroidism   Renal/GU      Musculoskeletal  (+) Arthritis -,   Abdominal (+) + obese,   Peds  Hematology  (+) anemia ,   Anesthesia Other Findings   Reproductive/Obstetrics                             Anesthesia Physical Anesthesia Plan  ASA: III  Anesthesia Plan: General   Post-op Pain Management:    Induction: Intravenous  Airway Management Planned: Nasal Cannula  Additional Equipment:   Intra-op Plan:   Post-operative Plan:   Informed Consent: I have reviewed the patients History and Physical, chart, labs and discussed the procedure including the risks, benefits and alternatives for the proposed anesthesia with the patient or authorized representative who has indicated his/her understanding and acceptance.     Plan Discussed with: CRNA  Anesthesia Plan Comments:         Anesthesia Quick Evaluation

## 2014-12-10 ENCOUNTER — Encounter: Payer: Self-pay | Admitting: Gastroenterology

## 2014-12-11 ENCOUNTER — Inpatient Hospital Stay: Payer: Medicare Other

## 2014-12-11 ENCOUNTER — Inpatient Hospital Stay: Payer: Medicare Other | Admitting: Oncology

## 2014-12-11 LAB — SURGICAL PATHOLOGY

## 2015-01-09 ENCOUNTER — Inpatient Hospital Stay: Payer: Medicare Other

## 2015-01-09 ENCOUNTER — Inpatient Hospital Stay: Payer: Medicare Other | Admitting: Oncology

## 2015-01-16 ENCOUNTER — Inpatient Hospital Stay: Payer: Medicare Other | Attending: Oncology

## 2015-01-16 DIAGNOSIS — R32 Unspecified urinary incontinence: Secondary | ICD-10-CM | POA: Insufficient documentation

## 2015-01-16 DIAGNOSIS — E1140 Type 2 diabetes mellitus with diabetic neuropathy, unspecified: Secondary | ICD-10-CM | POA: Insufficient documentation

## 2015-01-16 DIAGNOSIS — E7800 Pure hypercholesterolemia, unspecified: Secondary | ICD-10-CM | POA: Insufficient documentation

## 2015-01-16 DIAGNOSIS — Z79899 Other long term (current) drug therapy: Secondary | ICD-10-CM | POA: Diagnosis not present

## 2015-01-16 DIAGNOSIS — D696 Thrombocytopenia, unspecified: Secondary | ICD-10-CM | POA: Diagnosis not present

## 2015-01-16 DIAGNOSIS — D649 Anemia, unspecified: Secondary | ICD-10-CM | POA: Diagnosis not present

## 2015-01-16 DIAGNOSIS — L309 Dermatitis, unspecified: Secondary | ICD-10-CM | POA: Diagnosis not present

## 2015-01-16 DIAGNOSIS — Z794 Long term (current) use of insulin: Secondary | ICD-10-CM | POA: Insufficient documentation

## 2015-01-16 DIAGNOSIS — Z85038 Personal history of other malignant neoplasm of large intestine: Secondary | ICD-10-CM | POA: Insufficient documentation

## 2015-01-16 DIAGNOSIS — E039 Hypothyroidism, unspecified: Secondary | ICD-10-CM | POA: Insufficient documentation

## 2015-01-16 DIAGNOSIS — C189 Malignant neoplasm of colon, unspecified: Secondary | ICD-10-CM

## 2015-01-16 DIAGNOSIS — E213 Hyperparathyroidism, unspecified: Secondary | ICD-10-CM | POA: Diagnosis not present

## 2015-01-16 DIAGNOSIS — R6889 Other general symptoms and signs: Secondary | ICD-10-CM

## 2015-01-16 DIAGNOSIS — M545 Low back pain: Secondary | ICD-10-CM | POA: Diagnosis not present

## 2015-01-16 DIAGNOSIS — Z9221 Personal history of antineoplastic chemotherapy: Secondary | ICD-10-CM | POA: Insufficient documentation

## 2015-01-16 DIAGNOSIS — K219 Gastro-esophageal reflux disease without esophagitis: Secondary | ICD-10-CM | POA: Insufficient documentation

## 2015-01-16 DIAGNOSIS — M129 Arthropathy, unspecified: Secondary | ICD-10-CM | POA: Diagnosis not present

## 2015-01-16 LAB — COMPREHENSIVE METABOLIC PANEL
ALT: 12 U/L — AB (ref 14–54)
AST: 29 U/L (ref 15–41)
Albumin: 3.9 g/dL (ref 3.5–5.0)
Alkaline Phosphatase: 77 U/L (ref 38–126)
Anion gap: 7 (ref 5–15)
BUN: 17 mg/dL (ref 6–20)
CHLORIDE: 104 mmol/L (ref 101–111)
CO2: 25 mmol/L (ref 22–32)
CREATININE: 1.01 mg/dL — AB (ref 0.44–1.00)
Calcium: 8.4 mg/dL — ABNORMAL LOW (ref 8.9–10.3)
GFR calc non Af Amer: 51 mL/min — ABNORMAL LOW (ref >60)
GFR, EST AFRICAN AMERICAN: 59 mL/min — AB (ref >60)
Glucose, Bld: 158 mg/dL — ABNORMAL HIGH (ref 65–99)
Potassium: 4.1 mmol/L (ref 3.5–5.1)
SODIUM: 136 mmol/L (ref 135–145)
Total Bilirubin: 0.5 mg/dL (ref 0.3–1.2)
Total Protein: 7.3 g/dL (ref 6.5–8.1)

## 2015-01-16 LAB — CBC WITH DIFFERENTIAL/PLATELET
BASOS ABS: 0.1 10*3/uL (ref 0–0.1)
BASOS PCT: 1 %
EOS ABS: 0.3 10*3/uL (ref 0–0.7)
EOS PCT: 3 %
HCT: 35 % (ref 35.0–47.0)
Hemoglobin: 11.2 g/dL — ABNORMAL LOW (ref 12.0–16.0)
Lymphocytes Relative: 24 %
Lymphs Abs: 1.9 10*3/uL (ref 1.0–3.6)
MCH: 26.1 pg (ref 26.0–34.0)
MCHC: 31.9 g/dL — ABNORMAL LOW (ref 32.0–36.0)
MCV: 81.8 fL (ref 80.0–100.0)
Monocytes Absolute: 0.5 10*3/uL (ref 0.2–0.9)
Monocytes Relative: 6 %
Neutro Abs: 5.3 10*3/uL (ref 1.4–6.5)
Neutrophils Relative %: 66 %
PLATELETS: 152 10*3/uL (ref 150–440)
RBC: 4.28 MIL/uL (ref 3.80–5.20)
RDW: 14.3 % (ref 11.5–14.5)
WBC: 8 10*3/uL (ref 3.6–11.0)

## 2015-01-16 LAB — TSH: TSH: 6.306 u[IU]/mL — AB (ref 0.350–4.500)

## 2015-01-16 LAB — MAGNESIUM: Magnesium: 1.5 mg/dL — ABNORMAL LOW (ref 1.7–2.4)

## 2015-01-17 LAB — CEA: CEA: 2.7 ng/mL (ref 0.0–4.7)

## 2015-01-22 ENCOUNTER — Inpatient Hospital Stay (HOSPITAL_BASED_OUTPATIENT_CLINIC_OR_DEPARTMENT_OTHER): Payer: Medicare Other | Admitting: Oncology

## 2015-01-22 ENCOUNTER — Other Ambulatory Visit: Payer: Medicare Other

## 2015-01-22 VITALS — BP 176/85 | HR 99 | Temp 98.5°F | Resp 16 | Wt 161.2 lb

## 2015-01-22 DIAGNOSIS — Z79899 Other long term (current) drug therapy: Secondary | ICD-10-CM | POA: Diagnosis not present

## 2015-01-22 DIAGNOSIS — E039 Hypothyroidism, unspecified: Secondary | ICD-10-CM

## 2015-01-22 DIAGNOSIS — D696 Thrombocytopenia, unspecified: Secondary | ICD-10-CM

## 2015-01-22 DIAGNOSIS — Z794 Long term (current) use of insulin: Secondary | ICD-10-CM

## 2015-01-22 DIAGNOSIS — Z9221 Personal history of antineoplastic chemotherapy: Secondary | ICD-10-CM

## 2015-01-22 DIAGNOSIS — R32 Unspecified urinary incontinence: Secondary | ICD-10-CM

## 2015-01-22 DIAGNOSIS — E1140 Type 2 diabetes mellitus with diabetic neuropathy, unspecified: Secondary | ICD-10-CM

## 2015-01-22 DIAGNOSIS — M545 Low back pain: Secondary | ICD-10-CM

## 2015-01-22 DIAGNOSIS — L309 Dermatitis, unspecified: Secondary | ICD-10-CM

## 2015-01-22 DIAGNOSIS — C189 Malignant neoplasm of colon, unspecified: Secondary | ICD-10-CM

## 2015-01-22 DIAGNOSIS — Z85038 Personal history of other malignant neoplasm of large intestine: Secondary | ICD-10-CM | POA: Diagnosis not present

## 2015-01-22 DIAGNOSIS — D649 Anemia, unspecified: Secondary | ICD-10-CM

## 2015-01-22 DIAGNOSIS — E213 Hyperparathyroidism, unspecified: Secondary | ICD-10-CM

## 2015-01-22 DIAGNOSIS — K219 Gastro-esophageal reflux disease without esophagitis: Secondary | ICD-10-CM

## 2015-01-22 DIAGNOSIS — M129 Arthropathy, unspecified: Secondary | ICD-10-CM

## 2015-01-22 DIAGNOSIS — E7800 Pure hypercholesterolemia, unspecified: Secondary | ICD-10-CM

## 2015-02-05 NOTE — Progress Notes (Signed)
[place]  Telephone:([phone] Fax:([phone]) [phone]  ID: Andrea Lawson OB: Jun 25, 1931  MR#: [id number]  CWC#:[id number]  Patient Care Team: Madelyn Brunner, MD as PCP - General (Internal Medicine)  CHIEF COMPLAINT:  Chief Complaint  Patient presents with  . Colon Cancer    INTERVAL HISTORY: Patient returns to clinic today for laboratory work and routine yearly evaluation.  She continues to have peripheral neuropathy, but this is most likely related to her underlying diabetes.  She denies any changes in her bowel movements, melena or hematochezia.  She does not complain of weakness and fatigue today.  She has a good appetite and has maintained her weight.  She has no easy bleeding or bruising.  She denies any dizziness.  She denies any chest pain or shortness of breath.  She has no nausea, vomiting, diarrhea, or constipation.  Patient offers no specific complaints today.   REVIEW OF SYSTEMS:   Review of Systems  Constitutional: Negative.  Negative for malaise/fatigue.  Respiratory: Negative.   Cardiovascular: Negative.   Gastrointestinal: Negative.  Negative for blood in stool and melena.  Neurological: Negative.  Negative for weakness.    As per HPI. Otherwise, a complete review of systems is negatve.  PAST MEDICAL HISTORY: Past Medical History  Diagnosis Date  . Hypercholesterolemia   . Lumbago   . Eczema   . GERD (gastroesophageal reflux disease)   . Hemorrhoids   . Diabetes mellitus without complication   . Neuropathy due to secondary diabetes mellitus   . Hypothyroidism   . Hyperparathyroidism   . Arthritis   . Anemia   . Gout   . Cancer   . Mycotic toenails   . Drug-induced myopathy   . Incontinent of urine     PAST SURGICAL HISTORY: Past Surgical History  Procedure Laterality Date  . Eye surgery    . Tonsillectomy    . Knee arthroscopy    . Gout surgery    . Bunionectomy  2006  . Mouth surgery    . Diagnostic laparoscopy    .  Laparoscopic partial colectomy Right 03/16/2010  . Parathyroidectomy  2001  . Colonoscopy    . Esophagogastroduodenoscopy    . Colonoscopy with propofol N/A 12/09/2014    Procedure: COLONOSCOPY WITH PROPOFOL;  Surgeon: Lollie Sails, MD;  Location: [facility] ENDOSCOPY;  Service: Endoscopy;  Laterality: N/A;    FAMILY HISTORY No family history on file.     ADVANCED DIRECTIVES:    HEALTH MAINTENANCE: Social History  Substance Use Topics  . Smoking status: Never Smoker   . Smokeless tobacco: Never Used  . Alcohol Use: No     Colonoscopy:  PAP:  Bone density:  Lipid panel:  Allergies  Allergen Reactions  . Ace Inhibitors   . Amoxicillin   . Angiotensin Receptor Blockers Cough  . Losartan   . Penicillins   . Simvastatin     Current Outpatient Prescriptions  Medication Sig Dispense Refill  . carvedilol (COREG) 6.25 MG tablet Take 6.25 mg by mouth 2 (two) times daily with a meal.    . cholecalciferol (VITAMIN D) 400 UNITS TABS tablet Take 1,000 Units by mouth.    Marland Kitchen GAVILYTE-N WITH FLAVOR PACK 420 G solution     . lactase-rennet 25-2 MG tablet Take 1 tablet by mouth 3 (three) times daily with meals.    Marland Kitchen LANTUS SOLOSTAR 100 UNIT/ML Solostar Pen     . loperamide (IMODIUM) 2 MG capsule Take by mouth as needed  for diarrhea or loose stools.    . magnesium oxide (MAG-OX) 400 MG tablet Take 400 mg by mouth daily.    . metFORMIN (GLUCOPHAGE) 500 MG tablet Take by mouth 2 (two) times daily with a meal.    . NOVOLOG FLEXPEN 100 UNIT/ML FlexPen     . omeprazole (PRILOSEC) 20 MG capsule Take 20 mg by mouth daily.    Marland Kitchen oxybutynin (DITROPAN) 5 MG tablet Take 5 mg by mouth 2 (two) times daily.    . Probiotic Product (HEALTHY COLON PO) Take 1 capsule by mouth daily.     No current facility-administered medications for this visit.    OBJECTIVE: Filed Vitals:   01/22/15 1230  BP: 176/85  Pulse: 99  Temp: 98.5 F (36.9 C)  Resp: 16     Body mass index is 31.47 kg/(m^2).    ECOG  FS:0 - Asymptomatic  General: Well-developed, well-nourished, no acute distress. Eyes: Pink conjunctiva, anicteric sclera. Lungs: Clear to auscultation bilaterally. Heart: Regular rate and rhythm. No rubs, murmurs, or gallops. Abdomen: Soft, nontender, nondistended. No organomegaly noted, normoactive bowel sounds. Musculoskeletal: No edema, cyanosis, or clubbing. Neuro: Alert, answering all questions appropriately. Cranial nerves grossly intact. Skin: No rashes or petechiae noted. Psych: Normal affect.   LAB RESULTS:  Lab Results  Component Value Date   NA 136 01/16/2015   K 4.1 01/16/2015   CL 104 01/16/2015   CO2 25 01/16/2015   GLUCOSE 158* 01/16/2015   BUN 17 01/16/2015   CREATININE 1.01* 01/16/2015   CALCIUM 8.4* 01/16/2015   PROT 7.3 01/16/2015   ALBUMIN 3.9 01/16/2015   AST 29 01/16/2015   ALT 12* 01/16/2015   ALKPHOS 77 01/16/2015   BILITOT 0.5 01/16/2015   GFRNONAA 51* 01/16/2015   GFRAA 59* 01/16/2015    Lab Results  Component Value Date   WBC 8.0 01/16/2015   NEUTROABS 5.3 01/16/2015   HGB 11.2* 01/16/2015   HCT 35.0 01/16/2015   MCV 81.8 01/16/2015   PLT 152 01/16/2015     STUDIES: No results found.  ASSESSMENT: Stage IIIb adenocarcinoma of the colon.  PLAN:    1.  Colon cancer: No evidence of disease.  Patient's CEA continues to be within normal limits. Colonoscopy on December 09, 2014 was reported as within normal limits. Patient is approximately 4-1/2 years removed from completing her chemotherapy in June 2012. Given her normal colonoscopy and CEA, no further follow-up is necessary. Patient has been instructed to continue follow-up with GI as directed.  2.  Thrombocytopenia:  Resolved. 3.  Hypomagnesemia: Continue oral supplementation as prescribed.  Patient expressed understanding and was in agreement with this plan. She also understands that She can call clinic at any time with any questions, concerns, or complaints.    Lloyd Huger, MD    02/05/2015 11:26 PM

## 2016-01-20 DIAGNOSIS — H353210 Exudative age-related macular degeneration, right eye, stage unspecified: Secondary | ICD-10-CM | POA: Insufficient documentation

## 2016-07-21 DIAGNOSIS — D696 Thrombocytopenia, unspecified: Secondary | ICD-10-CM | POA: Insufficient documentation

## 2017-01-13 DIAGNOSIS — E538 Deficiency of other specified B group vitamins: Secondary | ICD-10-CM | POA: Insufficient documentation

## 2017-05-02 ENCOUNTER — Encounter: Payer: Self-pay | Admitting: *Deleted

## 2017-05-03 ENCOUNTER — Ambulatory Visit: Payer: Medicare Other | Admitting: Anesthesiology

## 2017-05-03 ENCOUNTER — Encounter: Payer: Self-pay | Admitting: *Deleted

## 2017-05-03 ENCOUNTER — Encounter
Admission: RE | Disposition: A | Discharge status: Home / Self Care | Payer: Self-pay | Source: Ambulatory Visit | Attending: Gastroenterology

## 2017-05-03 ENCOUNTER — Ambulatory Visit
Admission: RE | Admit: 2017-05-03 | Discharge: 2017-05-03 | Disposition: A | Discharge status: Home / Self Care | Payer: Medicare Other | Source: Ambulatory Visit | Attending: Gastroenterology | Admitting: Gastroenterology

## 2017-05-03 DIAGNOSIS — Z1211 Encounter for screening for malignant neoplasm of colon: Secondary | ICD-10-CM | POA: Insufficient documentation

## 2017-05-03 DIAGNOSIS — K6289 Other specified diseases of anus and rectum: Secondary | ICD-10-CM | POA: Insufficient documentation

## 2017-05-03 DIAGNOSIS — K529 Noninfective gastroenteritis and colitis, unspecified: Secondary | ICD-10-CM | POA: Diagnosis not present

## 2017-05-03 DIAGNOSIS — Z980 Intestinal bypass and anastomosis status: Secondary | ICD-10-CM | POA: Diagnosis not present

## 2017-05-03 DIAGNOSIS — I10 Essential (primary) hypertension: Secondary | ICD-10-CM | POA: Diagnosis not present

## 2017-05-03 DIAGNOSIS — E1140 Type 2 diabetes mellitus with diabetic neuropathy, unspecified: Secondary | ICD-10-CM | POA: Insufficient documentation

## 2017-05-03 DIAGNOSIS — Z85038 Personal history of other malignant neoplasm of large intestine: Secondary | ICD-10-CM | POA: Insufficient documentation

## 2017-05-03 DIAGNOSIS — K640 First degree hemorrhoids: Secondary | ICD-10-CM | POA: Insufficient documentation

## 2017-05-03 DIAGNOSIS — Z794 Long term (current) use of insulin: Secondary | ICD-10-CM | POA: Diagnosis not present

## 2017-05-03 DIAGNOSIS — Z85828 Personal history of other malignant neoplasm of skin: Secondary | ICD-10-CM | POA: Diagnosis not present

## 2017-05-03 DIAGNOSIS — K219 Gastro-esophageal reflux disease without esophagitis: Secondary | ICD-10-CM | POA: Insufficient documentation

## 2017-05-03 DIAGNOSIS — Z87891 Personal history of nicotine dependence: Secondary | ICD-10-CM | POA: Insufficient documentation

## 2017-05-03 DIAGNOSIS — Z86010 Personal history of colonic polyps: Secondary | ICD-10-CM | POA: Diagnosis not present

## 2017-05-03 DIAGNOSIS — K5730 Diverticulosis of large intestine without perforation or abscess without bleeding: Secondary | ICD-10-CM | POA: Insufficient documentation

## 2017-05-03 DIAGNOSIS — Z79899 Other long term (current) drug therapy: Secondary | ICD-10-CM | POA: Diagnosis not present

## 2017-05-03 HISTORY — DX: Personal history of other endocrine, nutritional and metabolic disease: Z86.39

## 2017-05-03 HISTORY — DX: Type 2 diabetes mellitus with diabetic neuropathy, unspecified: E11.40

## 2017-05-03 HISTORY — DX: Diverticulosis of intestine, part unspecified, without perforation or abscess without bleeding: K57.90

## 2017-05-03 HISTORY — DX: Personal history of other infectious and parasitic diseases: Z86.19

## 2017-05-03 HISTORY — DX: Basal cell carcinoma of skin, unspecified: C44.91

## 2017-05-03 HISTORY — PX: COLONOSCOPY WITH PROPOFOL: SHX5780

## 2017-05-03 HISTORY — DX: Cortical age-related cataract, unspecified eye: H25.019

## 2017-05-03 HISTORY — DX: Corns and callosities: L84

## 2017-05-03 HISTORY — DX: Deficiency of other specified B group vitamins: E53.8

## 2017-05-03 HISTORY — DX: Essential (primary) hypertension: I10

## 2017-05-03 HISTORY — DX: Drug-induced myopathy: G72.0

## 2017-05-03 HISTORY — DX: Allergy, unspecified, initial encounter: T78.40XA

## 2017-05-03 HISTORY — DX: Adverse effect of antihyperlipidemic and antiarteriosclerotic drugs, initial encounter: T46.6X5A

## 2017-05-03 LAB — CBC WITH DIFFERENTIAL/PLATELET
Basophils Absolute: 0 10*3/uL (ref 0–0.1)
Basophils Relative: 1 %
EOS ABS: 0.2 10*3/uL (ref 0–0.7)
EOS PCT: 2 %
HCT: 41.9 % (ref 35.0–47.0)
HEMOGLOBIN: 13.7 g/dL (ref 12.0–16.0)
LYMPHS ABS: 1.2 10*3/uL (ref 1.0–3.6)
LYMPHS PCT: 14 %
MCH: 29.4 pg (ref 26.0–34.0)
MCHC: 32.6 g/dL (ref 32.0–36.0)
MCV: 90.1 fL (ref 80.0–100.0)
Monocytes Absolute: 0.5 10*3/uL (ref 0.2–0.9)
Monocytes Relative: 7 %
NEUTROS PCT: 76 %
Neutro Abs: 6.5 10*3/uL (ref 1.4–6.5)
Platelets: 148 10*3/uL — ABNORMAL LOW (ref 150–440)
RBC: 4.64 MIL/uL (ref 3.80–5.20)
RDW: 12.9 % (ref 11.5–14.5)
WBC: 8.4 10*3/uL (ref 3.6–11.0)

## 2017-05-03 LAB — GLUCOSE, CAPILLARY: Glucose-Capillary: 142 mg/dL — ABNORMAL HIGH (ref 65–99)

## 2017-05-03 LAB — PROTIME-INR
INR: 1.03
PROTHROMBIN TIME: 13.4 s (ref 11.4–15.2)

## 2017-05-03 SURGERY — COLONOSCOPY WITH PROPOFOL
Anesthesia: General

## 2017-05-03 MED ORDER — PROPOFOL 10 MG/ML IV BOLUS
INTRAVENOUS | Status: DC | PRN
  Administered 2017-05-03 (×2): 50 mg via INTRAVENOUS

## 2017-05-03 MED ORDER — LIDOCAINE HCL (CARDIAC) 20 MG/ML IV SOLN
INTRAVENOUS | Status: DC | PRN
  Administered 2017-05-03: 80 mg via INTRAVENOUS

## 2017-05-03 MED ORDER — PROPOFOL 500 MG/50ML IV EMUL
INTRAVENOUS | Status: DC | PRN
  Administered 2017-05-03: 50 ug/kg/min via INTRAVENOUS

## 2017-05-03 MED ORDER — LACTATED RINGERS IV SOLN
INTRAVENOUS | Status: DC | PRN
  Administered 2017-05-03: 09:00:00 via INTRAVENOUS

## 2017-05-03 MED ORDER — LIDOCAINE HCL (CARDIAC) 20 MG/ML IV SOLN: INTRAVENOUS | Status: DC | PRN

## 2017-05-03 MED ORDER — SODIUM CHLORIDE 0.9 % IV SOLN: INTRAVENOUS | Status: DC

## 2017-05-03 MED ORDER — FENTANYL CITRATE (PF) 100 MCG/2ML IJ SOLN
INTRAMUSCULAR | Status: AC
  Filled 2017-05-03: qty 2

## 2017-05-03 MED ORDER — SODIUM CHLORIDE 0.9 % IV SOLN
INTRAVENOUS | Status: DC
  Administered 2017-05-03: 1000 mL via INTRAVENOUS

## 2017-05-03 MED ORDER — PROPOFOL 10 MG/ML IV BOLUS
INTRAVENOUS | Status: AC
  Filled 2017-05-03: qty 20

## 2017-05-03 MED ORDER — PROPOFOL 500 MG/50ML IV EMUL
INTRAVENOUS | Status: AC
  Filled 2017-05-03: qty 50

## 2017-05-03 NOTE — Op Note (Signed)
[facility] Gastroenterology Patient Name: Andrea Lawson Procedure Date: 05/03/2017 8:59 AM MRN: [id number] Account #: [id number] Date of Birth: 1931/07/13 Admit Type: Outpatient Age: 84 Room: [facility] ENDO ROOM 3 Gender: Female Note Status: Finalized Procedure:            Colonoscopy Indications:          Personal history of malignant neoplasm of the colon,                        Personal history of colonic polyps Providers:            Lollie Sails, MD Referring MD:         Ramonita Lab, MD (Referring MD) Medicines:            Monitored Anesthesia Care Complications:        No immediate complications. Procedure:            Pre-Anesthesia Assessment:                       - ASA Grade Assessment: III - A patient with severe                        systemic disease.                       After obtaining informed consent, the colonoscope was                        passed under direct vision. Throughout the procedure,                        the patient's blood pressure, pulse, and oxygen                        saturations were monitored continuously. The                        Colonoscope was introduced through the anus and                        advanced to the the ileocolonic anastomosis. The                        colonoscopy was performed with moderate difficulty due                        to significant looping. Successful completion of the                        procedure was aided by using manual pressure. The                        patient tolerated the procedure well. The quality of                        the bowel preparation was good. Findings:      Anastomotic mild inflammation characterized by congestion (edema) and       erythema was found at the anastomosis. Biopsies were taken with a cold       forceps for histology.      A 3  mm polyp was found in the rectum. The polyp was sessile. The polyp       was removed with a cold biopsy forceps. Resection and  retrieval were       complete.      A few small-mouthed diverticula were found in the sigmoid colon and       descending colon.      Non-bleeding internal hemorrhoids were found during retroflexion. The       hemorrhoids were small and Grade I (internal hemorrhoids that do not       prolapse).      The digital rectal exam was normal.      There was evidence of a prior functional end-to-end ileo-colonic       anastomosis in the distal ascending colon. This was patent and was       characterized by healthy appearing mucosa, erythema and an intact staple       line. The anastomosis was traversed. Impression:           - Anastomotic mild inflammation was found at the                        colonic anastomosis. Biopsied.                       - One 3 mm polyp in the rectum, removed with a cold                        biopsy forceps. Resected and retrieved.                       - Diverticulosis in the sigmoid colon and in the                        descending colon.                       - Non-bleeding internal hemorrhoids. Recommendation:       - Discharge patient to home.                       - Advance diet as tolerated.                       - Telephone GI clinic for pathology results in 1 week. Procedure Code(s):    --- Professional ---                       [phone], Colonoscopy, flexible; with biopsy, single or                        multiple Diagnosis Code(s):    --- Professional ---                       K52.9, Noninfective gastroenteritis and colitis,                        unspecified                       K62.1, Rectal polyp                       K64.0, First degree hemorrhoids  Z85.038, Personal history of other malignant neoplasm                        of large intestine                       Z86.010, Personal history of colonic polyps                       K57.30, Diverticulosis of large intestine without                        perforation or abscess  without bleeding CPT copyright 2016 American Medical Association. All rights reserved. The codes documented in this report are preliminary and upon coder review may  be revised to meet current compliance requirements. Lollie Sails, MD 05/03/2017 9:55:40 AM This report has been signed electronically. Number of Addenda: 0 Note Initiated On: 05/03/2017 8:59 AM Scope Withdrawal Time: 0 hours 11 minutes 40 seconds  Total Procedure Duration: 0 hours 31 minutes 59 seconds       [facility]

## 2017-05-03 NOTE — Anesthesia Postprocedure Evaluation (Signed)
Anesthesia Post Note  Patient: Andrea Lawson  Procedure(s) Performed: COLONOSCOPY WITH PROPOFOL (N/A )  Patient location during evaluation: PACU Anesthesia Type: General Level of consciousness: awake and alert and oriented Pain management: pain level controlled Vital Signs Assessment: post-procedure vital signs reviewed and stable Respiratory status: spontaneous breathing Cardiovascular status: blood pressure returned to baseline Anesthetic complications: no     Last Vitals:  Vitals:   05/03/17 1020 05/03/17 1030  BP: (!) 147/80 (!) 147/80  Pulse: 74 74  Resp:    Temp:    SpO2: 100% 100%    Last Pain:  Vitals:   05/03/17 0754  TempSrc: Tympanic                 Netasha Wehrli

## 2017-05-03 NOTE — Transfer of Care (Signed)
Immediate Anesthesia Transfer of Care Note  Patient: Andrea Lawson  Procedure(s) Performed: COLONOSCOPY WITH PROPOFOL (N/A )  Patient Location: PACU  Anesthesia Type:General  Level of Consciousness: alert   Airway & Oxygen Therapy: Patient Spontanous Breathing  Post-op Assessment: Report given to RN  Post vital signs: Reviewed and stable  Last Vitals:  Vitals:   05/03/17 0754 05/03/17 0959  BP: (!) 184/81 112/66  Pulse: 85 89  Resp: 20   Temp: (!) 36 C   SpO2: 100% 100%    Last Pain:  Vitals:   05/03/17 0754  TempSrc: Tympanic         Complications: No apparent anesthesia complications

## 2017-05-03 NOTE — Anesthesia Preprocedure Evaluation (Signed)
Anesthesia Evaluation  Patient identified by MRN, date of birth, ID band Patient awake    Reviewed: Allergy & Precautions, NPO status , Patient's Chart, lab work & pertinent test results, reviewed documented beta blocker date and time   History of Anesthesia Complications Negative for: history of anesthetic complications  Airway Mallampati: II       Dental no notable dental hx.    Pulmonary neg pulmonary ROS, former smoker,    Pulmonary exam normal        Cardiovascular Exercise Tolerance: Good METShypertension, Pt. on home beta blockers and Pt. on medications Normal cardiovascular exam     Neuro/Psych  Neuromuscular disease negative psych ROS   GI/Hepatic Neg liver ROS, GERD  Medicated,  Endo/Other  diabetes, Well Controlled, Type 1, Insulin DependentHypothyroidism   Renal/GU   negative genitourinary   Musculoskeletal  (+) Arthritis ,   Abdominal (+) + obese,   Peds negative pediatric ROS (+)  Hematology  (+) anemia ,   Anesthesia Other Findings   Reproductive/Obstetrics                             Anesthesia Physical  Anesthesia Plan  ASA: III  Anesthesia Plan: General   Post-op Pain Management:    Induction: Intravenous  PONV Risk Score and Plan:   Airway Management Planned: Nasal Cannula  Additional Equipment:   Intra-op Plan:   Post-operative Plan:   Informed Consent: I have reviewed the patients History and Physical, chart, labs and discussed the procedure including the risks, benefits and alternatives for the proposed anesthesia with the patient or authorized representative who has indicated his/her understanding and acceptance.     Plan Discussed with: CRNA  Anesthesia Plan Comments:         Anesthesia Quick Evaluation

## 2017-05-03 NOTE — H&P (Signed)
Outpatient short stay form Pre-procedure 05/03/2017 8:58 AM Lollie Sails MD  Primary Physician: Dr. Ramonita Lab  Reason for visit:  Colonoscopy  History of present illness:  Patient is a 84 year old female presenting today as above. She has personal history of cancer diagnosed in 2011. She had a right hemicolectomy. She has had colon polyps on several of her procedures. She tolerated her prep well. She takes no aspirin or blood thinning agent.    Current Facility-Administered Medications:  .  0.9 %  sodium chloride infusion, , Intravenous, Continuous, Lollie Sails, MD, Last Rate: 20 mL/hr at 05/03/17 0839, 1,000 mL at 05/03/17 0839 .  0.9 %  sodium chloride infusion, , Intravenous, Continuous, Lollie Sails, MD  Medications Prior to Admission  Medication Sig Dispense Refill Last Dose  . carvedilol (COREG) 6.25 MG tablet Take 6.25 mg by mouth 2 (two) times daily with a meal.   05/03/2017 at Unknown time  . Cyanocobalamin (VITAMIN B 12 PO) Take 1,000 mcg by mouth daily.     . insulin degludec (TRESIBA FLEXTOUCH) 100 UNIT/ML SOPN FlexTouch Pen Inject 100 Units into the skin daily at 10 pm.     . Multiple Vitamins-Minerals (ANTIOXIDANT PO) Take 2 tablets by mouth daily.     . cholecalciferol (VITAMIN D) 400 UNITS TABS tablet Take 1,000 Units by mouth.   Taking  . GAVILYTE-N WITH FLAVOR PACK 420 G solution    Taking  . lactase-rennet 25-2 MG tablet Take 1 tablet by mouth 3 (three) times daily with meals.   Taking  . LANTUS SOLOSTAR 100 UNIT/ML Solostar Pen    Taking  . loperamide (IMODIUM) 2 MG capsule Take by mouth as needed for diarrhea or loose stools.   Taking  . magnesium oxide (MAG-OX) 400 MG tablet Take 400 mg by mouth daily.   Taking  . metFORMIN (GLUCOPHAGE) 500 MG tablet Take by mouth 2 (two) times daily with a meal.   Taking  . omeprazole (PRILOSEC) 20 MG capsule Take 20 mg by mouth daily.   Taking  . oxybutynin (DITROPAN) 5 MG tablet Take 5 mg by mouth 2 (two) times  daily.   Taking  . Probiotic Product (HEALTHY COLON PO) Take 1 capsule by mouth daily.   Taking  . [DISCONTINUED] NOVOLOG FLEXPEN 100 UNIT/ML FlexPen    Taking     Allergies  Allergen Reactions  . Ace Inhibitors   . Angiotensin Receptor Blockers Cough  . Losartan   . Simvastatin      Past Medical History:  Diagnosis Date  . Allergic state   . Anemia    IRON DEF.ANEMIA  . Arthritis    OSTEOARTHRITIS OF BIL.KNEES  . B12 deficiency   . Basal cell carcinoma   . Callus of foot    bil.  . Cancer ([place])    of CECUM  . Cataract cortical, senile    RIGHT EYE  . Cholesterol-lowering agent myopathy 2004  . Diabetes mellitus without complication ([place])   . Diabetic neuropathy ([place])   . Diverticulosis   . Drug-induced myopathy   . Eczema   . GERD (gastroesophageal reflux disease)   . Gout   . Hemorrhoids   . History of chicken pox   . History of hypercalcemia   . Hypercholesterolemia   . Hyperparathyroidism ([place])   . Hypertension   . Hypothyroidism   . Incontinent of urine   . Lumbago   . Mycotic toenails   . Neuropathy due to secondary diabetes mellitus ([place])  Review of systems:      Physical Exam    Heart and lungs: Regular rate and rhythm without rub or gallop, lungs are bilaterally clear.    HEENT: Normocephalic atraumatic eyes are anicteric    Other:     Pertinant exam for procedure: Soft nontender nondistended bowel sounds positive normoactive    Planned proceedures: Colonoscopy and indicated procedures. I have discussed the risks benefits and complications of procedures to include not limited to bleeding, infection, perforation and the risk of sedation and the patient wishes to proceed.    Lollie Sails, MD Gastroenterology 05/03/2017  8:58 AM

## 2017-05-03 NOTE — Anesthesia Post-op Follow-up Note (Signed)
Anesthesia QCDR form completed.        

## 2017-05-04 ENCOUNTER — Encounter: Payer: Self-pay | Admitting: Gastroenterology

## 2017-05-04 LAB — SURGICAL PATHOLOGY

## 2019-11-01 DIAGNOSIS — Z9181 History of falling: Secondary | ICD-10-CM | POA: Insufficient documentation

## 2020-02-16 ENCOUNTER — Encounter: Payer: Self-pay | Admitting: Emergency Medicine

## 2020-02-16 ENCOUNTER — Other Ambulatory Visit: Payer: Self-pay

## 2020-02-16 ENCOUNTER — Emergency Department
Admission: EM | Admit: 2020-02-16 | Discharge: 2020-02-16 | Disposition: A | Discharge status: Home / Self Care | Payer: Medicare Other | Attending: Emergency Medicine | Admitting: Emergency Medicine

## 2020-02-16 DIAGNOSIS — Z760 Encounter for issue of repeat prescription: Secondary | ICD-10-CM | POA: Insufficient documentation

## 2020-02-16 DIAGNOSIS — E039 Hypothyroidism, unspecified: Secondary | ICD-10-CM | POA: Insufficient documentation

## 2020-02-16 DIAGNOSIS — E1140 Type 2 diabetes mellitus with diabetic neuropathy, unspecified: Secondary | ICD-10-CM | POA: Insufficient documentation

## 2020-02-16 DIAGNOSIS — Z794 Long term (current) use of insulin: Secondary | ICD-10-CM | POA: Diagnosis not present

## 2020-02-16 DIAGNOSIS — E1165 Type 2 diabetes mellitus with hyperglycemia: Secondary | ICD-10-CM | POA: Diagnosis not present

## 2020-02-16 DIAGNOSIS — Z87891 Personal history of nicotine dependence: Secondary | ICD-10-CM | POA: Diagnosis not present

## 2020-02-16 DIAGNOSIS — R197 Diarrhea, unspecified: Secondary | ICD-10-CM | POA: Insufficient documentation

## 2020-02-16 DIAGNOSIS — I10 Essential (primary) hypertension: Secondary | ICD-10-CM | POA: Diagnosis not present

## 2020-02-16 DIAGNOSIS — Z7984 Long term (current) use of oral hypoglycemic drugs: Secondary | ICD-10-CM | POA: Insufficient documentation

## 2020-02-16 DIAGNOSIS — Z85038 Personal history of other malignant neoplasm of large intestine: Secondary | ICD-10-CM | POA: Insufficient documentation

## 2020-02-16 DIAGNOSIS — Z79899 Other long term (current) drug therapy: Secondary | ICD-10-CM | POA: Insufficient documentation

## 2020-02-16 LAB — COMPREHENSIVE METABOLIC PANEL
ALT: 11 U/L (ref 0–44)
AST: 26 U/L (ref 15–41)
Albumin: 3.8 g/dL (ref 3.5–5.0)
Alkaline Phosphatase: 73 U/L (ref 38–126)
Anion gap: 14 (ref 5–15)
BUN: 16 mg/dL (ref 8–23)
CO2: 23 mmol/L (ref 22–32)
Calcium: 8.7 mg/dL — ABNORMAL LOW (ref 8.9–10.3)
Chloride: 103 mmol/L (ref 98–111)
Creatinine, Ser: 0.87 mg/dL (ref 0.44–1.00)
GFR, Estimated: >60 mL/min (ref >60)
Glucose, Bld: 180 mg/dL — ABNORMAL HIGH (ref 70–99)
Potassium: 3.7 mmol/L (ref 3.5–5.1)
Sodium: 140 mmol/L (ref 135–145)
Total Bilirubin: 0.9 mg/dL (ref 0.3–1.2)
Total Protein: 7.1 g/dL (ref 6.5–8.1)

## 2020-02-16 LAB — CBG MONITORING, ED
Glucose-Capillary: 164 mg/dL — ABNORMAL HIGH (ref 70–99)
Glucose-Capillary: 125 mg/dL — ABNORMAL HIGH (ref 70–99)

## 2020-02-16 LAB — CBC
HCT: 36.2 % (ref 36.0–46.0)
Hemoglobin: 11.1 g/dL — ABNORMAL LOW (ref 12.0–15.0)
MCH: 27.9 pg (ref 26.0–34.0)
MCHC: 30.7 g/dL (ref 30.0–36.0)
MCV: 91 fL (ref 80.0–100.0)
Platelets: 132 10*3/uL — ABNORMAL LOW (ref 150–400)
RBC: 3.98 MIL/uL (ref 3.87–5.11)
RDW: 13.1 % (ref 11.5–15.5)
WBC: 7.7 10*3/uL (ref 4.0–10.5)
nRBC: 0 % (ref 0.0–0.2)

## 2020-02-16 LAB — LIPASE, BLOOD: Lipase: 39 U/L (ref 11–51)

## 2020-02-16 MED ORDER — INSULIN GLARGINE 100 UNIT/ML ~~LOC~~ SOLN
10.0000 [IU] | Freq: Once | SUBCUTANEOUS | Status: AC
  Administered 2020-02-16: 10 [IU] via SUBCUTANEOUS
  Filled 2020-02-16: qty 0.1

## 2020-02-16 NOTE — ED Notes (Signed)
Daughter called, on her way to pick up patient, patient ambulated to restroom with steady gait

## 2020-02-16 NOTE — ED Notes (Addendum)
Pt here from [facility] independent living. Pt states she uses triseba 10-15 units for her diabetes but has been without it for a week, when she attempted to get her prescription her medication was sent in wrong and she was only able to get the needles. Pt denies any symptoms or pain at this time.

## 2020-02-16 NOTE — ED Triage Notes (Signed)
Pt in via EMS from [facility] independent living with c/o not feeling well. Pt has been out of insulin since Tuesday and has not been able to contact MD. FSBS 232. Pt feeling weak.

## 2020-02-16 NOTE — ED Provider Notes (Signed)
[facility] Emergency Department Provider Note   ____________________________________________    I have reviewed the triage vital signs and the nursing notes.   HISTORY  Chief Complaint Diarrhea and Hyperglycemia     HPI Andrea Lawson is a 86 y.o. female with history of diabetes who typically takes 10-15 units of [place] daily presents because she has been out of her [place] since Tuesday.  She had some complications trying to get the medication from her pharmacy which had apparently run out.  She is requesting an injection of Tresiba to carry her until Monday when she can get it refilled.  Overall she feels well, she reports she has had some loose stools.  Denies fevers or chills.  She reports her glucose has been relatively well controlled.  Past Medical History:  Diagnosis Date  . Allergic state   . Anemia    IRON DEF.ANEMIA  . Arthritis    OSTEOARTHRITIS OF BIL.KNEES  . B12 deficiency   . Basal cell carcinoma   . Callus of foot    bil.  . Cancer ([place])    of CECUM  . Cataract cortical, senile    RIGHT EYE  . Cholesterol-lowering agent myopathy 2004  . Diabetes mellitus without complication ([place])   . Diabetic neuropathy ([place])   . Diverticulosis   . Drug-induced myopathy   . Eczema   . GERD (gastroesophageal reflux disease)   . Gout   . Hemorrhoids   . History of chicken pox   . History of hypercalcemia   . Hypercholesterolemia   . Hyperparathyroidism ([place])   . Hypertension   . Hypothyroidism   . Incontinent of urine   . Lumbago   . Mycotic toenails   . Neuropathy due to secondary diabetes mellitus ([place])     There are no problems to display for this patient.   Past Surgical History:  Procedure Laterality Date  . BUNIONECTOMY  2006  . COLONOSCOPY    . COLONOSCOPY WITH PROPOFOL N/A 12/09/2014   Procedure: COLONOSCOPY WITH PROPOFOL;  Surgeon: Lollie Sails, MD;  Location: [facility] ENDOSCOPY;  Service: Endoscopy;  Laterality:  N/A;  . COLONOSCOPY WITH PROPOFOL N/A 05/03/2017   Procedure: COLONOSCOPY WITH PROPOFOL;  Surgeon: Lollie Sails, MD;  Location: [facility] ENDOSCOPY;  Service: Endoscopy;  Laterality: N/A;  . DEBRIDEMENT OF CALLUSES Bilateral    FEET  . DIAGNOSTIC LAPAROSCOPY    . ESOPHAGOGASTRODUODENOSCOPY    . EYE SURGERY    . GOUT SURGERY    . KNEE ARTHROSCOPY    . LAPAROSCOPIC PARTIAL COLECTOMY Right 03/16/2010  . MOHS SURGERY  12/2008  . MOUTH SURGERY    . PARATHYROIDECTOMY  2001  . TONSILLECTOMY      Prior to Admission medications   Medication Sig Start Date End Date Taking? Authorizing Provider  carvedilol (COREG) 6.25 MG tablet Take 6.25 mg by mouth 2 (two) times daily with a meal.    [provider]  cholecalciferol (VITAMIN D) 400 UNITS TABS tablet Take 1,000 Units by mouth.    [provider]  Cyanocobalamin (VITAMIN B 12 PO) Take 1,000 mcg by mouth daily.    [provider]  GAVILYTE-N WITH FLAVOR PACK 420 G solution  10/29/14   [provider]  insulin degludec (TRESIBA FLEXTOUCH) 100 UNIT/ML SOPN FlexTouch Pen Inject 100 Units into the skin daily at 10 pm.    [provider]  lactase-rennet 25-2 MG tablet Take 1 tablet by mouth 3 (three) times daily  with meals.    [provider]  LANTUS SOLOSTAR 100 UNIT/ML Solostar Pen  12/14/14   [provider]  loperamide (IMODIUM) 2 MG capsule Take by mouth as needed for diarrhea or loose stools.    [provider]  magnesium oxide (MAG-OX) 400 MG tablet Take 400 mg by mouth daily.    [provider]  metFORMIN (GLUCOPHAGE) 500 MG tablet Take by mouth 2 (two) times daily with a meal.    [provider]  Multiple Vitamins-Minerals (ANTIOXIDANT PO) Take 2 tablets by mouth daily.    [provider]  omeprazole (PRILOSEC) 20 MG capsule Take 20 mg by mouth daily.    [provider]  oxybutynin (DITROPAN) 5 MG tablet Take 5 mg by mouth 2 (two) times  daily.    [provider]  Probiotic Product (HEALTHY COLON PO) Take 1 capsule by mouth daily.    [provider]     Allergies Simvastatin, Ace inhibitors, Angiotensin receptor blockers, Losartan, and Penicillins  History reviewed. No pertinent family history.  Social History Social History   Tobacco Use  . Smoking status: Former Research scientist (life sciences)  . Smokeless tobacco: Never Used  Vaping Use  . Vaping Use: Never used  Substance Use Topics  . Alcohol use: Yes    Comment: WINE  . Drug use: No    Review of Systems  Constitutional: No fever/chills Eyes: No visual changes.  ENT: No sore throat. Cardiovascular: Denies chest pain. Respiratory: Denies shortness of breath. Gastrointestinal: No abdominal pain.  No nausea, no vomiting.   Genitourinary: Negative for dysuria. Musculoskeletal: Negative for back pain. Skin: Negative for rash. Neurological: Negative for headaches    ____________________________________________   PHYSICAL EXAM:  VITAL SIGNS: ED Triage Vitals [02/16/20 1307]  Enc Vitals Group     BP (!) 159/114     Pulse Rate 81     Resp 18     Temp 98.9 F (37.2 C)     Temp Source Oral     SpO2 99 %     Weight 63.5 kg (140 lb)     Height 1.524 m (5')     Head Circumference      Peak Flow      Pain Score 0     Pain Loc      Pain Edu?      Excl. in [place]?     Constitutional: Alert and oriented.  . Nose: No congestion/rhinnorhea. Mouth/Throat: Mucous membranes are moist.   Neck:  Painless ROM Cardiovascular: Normal rate, regular rhythm. Kermit Balo peripheral circulation. Respiratory: Normal respiratory effort.  No retractions.  Gastrointestinal: Soft and nontender. No distention.    Musculoskeletal:   Warm and well perfused Neurologic:  Normal speech and language. No gross focal neurologic deficits are appreciated.  Skin:  Skin is warm, dry and intact. No rash noted. Psychiatric: Mood and affect are normal. Speech and behavior are  normal.  ____________________________________________   LABS (all labs ordered are listed, but only abnormal results are displayed)  Labs Reviewed  COMPREHENSIVE METABOLIC PANEL - Abnormal; Notable for the following components:      Result Value   Glucose, Bld 180 (*)    Calcium 8.7 (*)    All other components within normal limits  CBC - Abnormal; Notable for the following components:   Hemoglobin 11.1 (*)    Platelets 132 (*)    All other components within normal limits  CBG MONITORING, ED - Abnormal; Notable for the following  components:   Glucose-Capillary 164 (*)    All other components within normal limits  LIPASE, BLOOD  URINALYSIS, COMPLETE (UACMP) WITH MICROSCOPIC   ____________________________________________  EKG  None ____________________________________________  RADIOLOGY  None ____________________________________________   PROCEDURES  Procedure(s) performed: No  Procedures   Critical Care performed: No ____________________________________________   INITIAL IMPRESSION / ASSESSMENT AND PLAN / ED COURSE  Pertinent labs & imaging results that were available during my care of the patient were reviewed by me and considered in my medical decision making (see chart for details).  Patient well-appearing and in no acute distress, lab work is unremarkable, glucose of 164 on CBG.  Patient has no physical complaints at this time, she is impatient to leave.  We will give 10 units of Lantus, no further work-up required.    ____________________________________________   FINAL CLINICAL IMPRESSION(S) / ED DIAGNOSES  Final diagnoses:  Medication refill        Note:  This document was prepared using Dragon voice recognition software and may include unintentional dictation errors.   Lavonia Drafts, MD 02/16/20 1520

## 2020-02-16 NOTE — ED Triage Notes (Addendum)
Pt arrived via [facility] from [facility], pt states she called the nurses station to see if they could help her get insulin, but they were unable to help her.  Pt reports she has not had her insulin since Tuesday or Wednesday, states she called her PCP's office on Friday and they misunderstood her and sent in RX for needles.  Pt states the pharmacy is not able to get her meds until Monday.   Pt also states she has had some diarrhea and took imodium.

## 2020-02-16 NOTE — ED Notes (Signed)
Pt ambulatory to bathroom with one person assist 

## 2020-05-07 DIAGNOSIS — E11621 Type 2 diabetes mellitus with foot ulcer: Secondary | ICD-10-CM | POA: Insufficient documentation

## 2020-07-07 ENCOUNTER — Other Ambulatory Visit: Payer: Self-pay

## 2020-07-07 ENCOUNTER — Inpatient Hospital Stay
Admission: EM | Admit: 2020-07-07 | Discharge: 2020-07-09 | DRG: 291 | Disposition: A | Discharge status: Skilled Nursing Facility | Payer: Medicare Other | Attending: Internal Medicine | Admitting: Internal Medicine

## 2020-07-07 ENCOUNTER — Encounter: Payer: Self-pay | Admitting: Family Medicine

## 2020-07-07 ENCOUNTER — Emergency Department: Payer: Medicare Other

## 2020-07-07 DIAGNOSIS — E538 Deficiency of other specified B group vitamins: Secondary | ICD-10-CM | POA: Diagnosis present

## 2020-07-07 DIAGNOSIS — J9601 Acute respiratory failure with hypoxia: Secondary | ICD-10-CM

## 2020-07-07 DIAGNOSIS — I509 Heart failure, unspecified: Secondary | ICD-10-CM | POA: Diagnosis present

## 2020-07-07 DIAGNOSIS — Z79899 Other long term (current) drug therapy: Secondary | ICD-10-CM

## 2020-07-07 DIAGNOSIS — W19XXXA Unspecified fall, initial encounter: Secondary | ICD-10-CM

## 2020-07-07 DIAGNOSIS — Z87891 Personal history of nicotine dependence: Secondary | ICD-10-CM | POA: Diagnosis not present

## 2020-07-07 DIAGNOSIS — K219 Gastro-esophageal reflux disease without esophagitis: Secondary | ICD-10-CM | POA: Diagnosis present

## 2020-07-07 DIAGNOSIS — I2510 Atherosclerotic heart disease of native coronary artery without angina pectoris: Secondary | ICD-10-CM | POA: Diagnosis present

## 2020-07-07 DIAGNOSIS — Z888 Allergy status to other drugs, medicaments and biological substances status: Secondary | ICD-10-CM

## 2020-07-07 DIAGNOSIS — Z880 Allergy status to penicillin: Secondary | ICD-10-CM | POA: Diagnosis not present

## 2020-07-07 DIAGNOSIS — Z85038 Personal history of other malignant neoplasm of large intestine: Secondary | ICD-10-CM | POA: Diagnosis not present

## 2020-07-07 DIAGNOSIS — Z20822 Contact with and (suspected) exposure to covid-19: Secondary | ICD-10-CM | POA: Diagnosis present

## 2020-07-07 DIAGNOSIS — E119 Type 2 diabetes mellitus without complications: Secondary | ICD-10-CM

## 2020-07-07 DIAGNOSIS — M869 Osteomyelitis, unspecified: Secondary | ICD-10-CM | POA: Diagnosis not present

## 2020-07-07 DIAGNOSIS — Z794 Long term (current) use of insulin: Secondary | ICD-10-CM | POA: Diagnosis not present

## 2020-07-07 DIAGNOSIS — Z9181 History of falling: Secondary | ICD-10-CM

## 2020-07-07 DIAGNOSIS — Z7984 Long term (current) use of oral hypoglycemic drugs: Secondary | ICD-10-CM

## 2020-07-07 DIAGNOSIS — D509 Iron deficiency anemia, unspecified: Secondary | ICD-10-CM | POA: Diagnosis present

## 2020-07-07 DIAGNOSIS — Y92009 Unspecified place in unspecified non-institutional (private) residence as the place of occurrence of the external cause: Secondary | ICD-10-CM | POA: Diagnosis not present

## 2020-07-07 DIAGNOSIS — Z85828 Personal history of other malignant neoplasm of skin: Secondary | ICD-10-CM | POA: Diagnosis not present

## 2020-07-07 DIAGNOSIS — Z66 Do not resuscitate: Secondary | ICD-10-CM | POA: Diagnosis present

## 2020-07-07 DIAGNOSIS — I2489 Other forms of acute ischemic heart disease: Secondary | ICD-10-CM

## 2020-07-07 DIAGNOSIS — I4510 Unspecified right bundle-branch block: Secondary | ICD-10-CM | POA: Diagnosis present

## 2020-07-07 DIAGNOSIS — E1140 Type 2 diabetes mellitus with diabetic neuropathy, unspecified: Secondary | ICD-10-CM | POA: Diagnosis present

## 2020-07-07 DIAGNOSIS — N3281 Overactive bladder: Secondary | ICD-10-CM | POA: Diagnosis present

## 2020-07-07 DIAGNOSIS — I110 Hypertensive heart disease with heart failure: Secondary | ICD-10-CM | POA: Diagnosis present

## 2020-07-07 DIAGNOSIS — I10 Essential (primary) hypertension: Secondary | ICD-10-CM

## 2020-07-07 DIAGNOSIS — N179 Acute kidney failure, unspecified: Secondary | ICD-10-CM | POA: Diagnosis not present

## 2020-07-07 DIAGNOSIS — I248 Other forms of acute ischemic heart disease: Secondary | ICD-10-CM | POA: Diagnosis present

## 2020-07-07 DIAGNOSIS — E1159 Type 2 diabetes mellitus with other circulatory complications: Secondary | ICD-10-CM | POA: Diagnosis not present

## 2020-07-07 DIAGNOSIS — E876 Hypokalemia: Secondary | ICD-10-CM | POA: Diagnosis present

## 2020-07-07 DIAGNOSIS — I5031 Acute diastolic (congestive) heart failure: Secondary | ICD-10-CM | POA: Diagnosis present

## 2020-07-07 DIAGNOSIS — M170 Bilateral primary osteoarthritis of knee: Secondary | ICD-10-CM | POA: Diagnosis present

## 2020-07-07 DIAGNOSIS — Z9049 Acquired absence of other specified parts of digestive tract: Secondary | ICD-10-CM

## 2020-07-07 LAB — BASIC METABOLIC PANEL
Anion gap: 11 (ref 5–15)
BUN: 11 mg/dL (ref 8–23)
CO2: 25 mmol/L (ref 22–32)
Calcium: 8.4 mg/dL — ABNORMAL LOW (ref 8.9–10.3)
Chloride: 104 mmol/L (ref 98–111)
Creatinine, Ser: 0.62 mg/dL (ref 0.44–1.00)
GFR, Estimated: >60 mL/min (ref >60)
Glucose, Bld: 181 mg/dL — ABNORMAL HIGH (ref 70–99)
Potassium: 3.4 mmol/L — ABNORMAL LOW (ref 3.5–5.1)
Sodium: 140 mmol/L (ref 135–145)

## 2020-07-07 LAB — CBC WITH DIFFERENTIAL/PLATELET
Abs Immature Granulocytes: 0.06 10*3/uL (ref 0.00–0.07)
Basophils Absolute: 0 10*3/uL (ref 0.0–0.1)
Basophils Relative: 0 %
Eosinophils Absolute: 0.3 10*3/uL (ref 0.0–0.5)
Eosinophils Relative: 3 %
HCT: 37.4 % (ref 36.0–46.0)
Hemoglobin: 11.1 g/dL — ABNORMAL LOW (ref 12.0–15.0)
Immature Granulocytes: 1 %
Lymphocytes Relative: 12 %
Lymphs Abs: 1.2 10*3/uL (ref 0.7–4.0)
MCH: 26.4 pg (ref 26.0–34.0)
MCHC: 29.7 g/dL — ABNORMAL LOW (ref 30.0–36.0)
MCV: 88.8 fL (ref 80.0–100.0)
Monocytes Absolute: 0.5 10*3/uL (ref 0.1–1.0)
Monocytes Relative: 5 %
Neutro Abs: 7.8 10*3/uL — ABNORMAL HIGH (ref 1.7–7.7)
Neutrophils Relative %: 79 %
Platelets: 148 10*3/uL — ABNORMAL LOW (ref 150–400)
RBC: 4.21 MIL/uL (ref 3.87–5.11)
RDW: 14.6 % (ref 11.5–15.5)
WBC: 9.8 10*3/uL (ref 4.0–10.5)
nRBC: 0 % (ref 0.0–0.2)

## 2020-07-07 LAB — RESP PANEL BY RT-PCR (FLU A&B, COVID) ARPGX2
Influenza A by PCR: NEGATIVE
Influenza B by PCR: NEGATIVE
SARS Coronavirus 2 by RT PCR: NEGATIVE

## 2020-07-07 LAB — GLUCOSE, CAPILLARY
Glucose-Capillary: 144 mg/dL — ABNORMAL HIGH (ref 70–99)
Glucose-Capillary: 134 mg/dL — ABNORMAL HIGH (ref 70–99)
Glucose-Capillary: 185 mg/dL — ABNORMAL HIGH (ref 70–99)

## 2020-07-07 LAB — MAGNESIUM: Magnesium: 1.7 mg/dL (ref 1.7–2.4)

## 2020-07-07 LAB — CBG MONITORING, ED: Glucose-Capillary: 227 mg/dL — ABNORMAL HIGH (ref 70–99)

## 2020-07-07 LAB — HEMOGLOBIN A1C
Hgb A1c MFr Bld: 7.4 % — ABNORMAL HIGH (ref 4.8–5.6)
Mean Plasma Glucose: 165.68 mg/dL

## 2020-07-07 LAB — TROPONIN I (HIGH SENSITIVITY): Troponin I (High Sensitivity): 114 ng/L (ref <18)

## 2020-07-07 LAB — BRAIN NATRIURETIC PEPTIDE: B Natriuretic Peptide: 480.4 pg/mL — ABNORMAL HIGH (ref 0.0–100.0)

## 2020-07-07 MED ORDER — ONDANSETRON HCL 4 MG/2ML IJ SOLN: 4.0000 mg | Freq: Four times a day (QID) | INTRAMUSCULAR | Status: DC | PRN

## 2020-07-07 MED ORDER — ACETAMINOPHEN 325 MG PO TABS
650.0000 mg | ORAL_TABLET | Freq: Four times a day (QID) | ORAL | Status: DC | PRN
  Filled 2020-07-07: qty 2

## 2020-07-07 MED ORDER — IOHEXOL 350 MG/ML SOLN
100.0000 mL | Freq: Once | INTRAVENOUS | Status: AC | PRN
  Administered 2020-07-07: 100 mL via INTRAVENOUS

## 2020-07-07 MED ORDER — POTASSIUM CHLORIDE 20 MEQ PO PACK
40.0000 meq | PACK | Freq: Once | ORAL | Status: DC
  Filled 2020-07-07: qty 2

## 2020-07-07 MED ORDER — POTASSIUM CHLORIDE 20 MEQ PO PACK
40.0000 meq | PACK | Freq: Once | ORAL | Status: AC
  Administered 2020-07-07: 40 meq via ORAL
  Filled 2020-07-07: qty 2

## 2020-07-07 MED ORDER — TRAZODONE HCL 50 MG PO TABS: 25.0000 mg | ORAL_TABLET | Freq: Every evening | ORAL | Status: DC | PRN

## 2020-07-07 MED ORDER — MAGNESIUM HYDROXIDE 400 MG/5ML PO SUSP: 30.0000 mL | Freq: Every day | ORAL | Status: DC | PRN

## 2020-07-07 MED ORDER — ENOXAPARIN SODIUM 40 MG/0.4ML ~~LOC~~ SOLN
40.0000 mg | SUBCUTANEOUS | Status: DC
  Administered 2020-07-07 – 2020-07-08 (×2): 40 mg via SUBCUTANEOUS
  Filled 2020-07-07 (×2): qty 0.4

## 2020-07-07 MED ORDER — IPRATROPIUM-ALBUTEROL 0.5-2.5 (3) MG/3ML IN SOLN
3.0000 mL | Freq: Once | RESPIRATORY_TRACT | Status: AC
  Administered 2020-07-07: 3 mL via RESPIRATORY_TRACT
  Filled 2020-07-07: qty 3

## 2020-07-07 MED ORDER — INSULIN ASPART 100 UNIT/ML ~~LOC~~ SOLN
0.0000 [IU] | Freq: Three times a day (TID) | SUBCUTANEOUS | Status: DC
  Administered 2020-07-07: 1 [IU] via SUBCUTANEOUS
  Administered 2020-07-07: 3 [IU] via SUBCUTANEOUS
  Administered 2020-07-07: 1 [IU] via SUBCUTANEOUS
  Administered 2020-07-07 – 2020-07-08 (×2): 2 [IU] via SUBCUTANEOUS
  Administered 2020-07-08: 1 [IU] via SUBCUTANEOUS
  Administered 2020-07-08 – 2020-07-09 (×2): 3 [IU] via SUBCUTANEOUS
  Filled 2020-07-07 (×9): qty 1

## 2020-07-07 MED ORDER — INSULIN DEGLUDEC 100 UNIT/ML ~~LOC~~ SOPN: 100.0000 [IU] | PEN_INJECTOR | Freq: Every day | SUBCUTANEOUS | Status: DC

## 2020-07-07 MED ORDER — ASPIRIN EC 81 MG PO TBEC
81.0000 mg | DELAYED_RELEASE_TABLET | Freq: Every day | ORAL | Status: DC
  Administered 2020-07-07 – 2020-07-09 (×3): 81 mg via ORAL
  Filled 2020-07-07 (×3): qty 1

## 2020-07-07 MED ORDER — FUROSEMIDE 10 MG/ML IJ SOLN
40.0000 mg | Freq: Two times a day (BID) | INTRAMUSCULAR | Status: DC
  Administered 2020-07-07: 40 mg via INTRAVENOUS
  Filled 2020-07-07: qty 4

## 2020-07-07 MED ORDER — OCUVITE-LUTEIN PO CAPS
ORAL_CAPSULE | Freq: Every day | ORAL | Status: DC
  Administered 2020-07-07 – 2020-07-09 (×3): 1 via ORAL
  Filled 2020-07-07 (×4): qty 1

## 2020-07-07 MED ORDER — VITAMIN D 25 MCG (1000 UNIT) PO TABS
1000.0000 [IU] | ORAL_TABLET | Freq: Every day | ORAL | Status: DC
  Administered 2020-07-07 – 2020-07-09 (×3): 1000 [IU] via ORAL
  Filled 2020-07-07 (×3): qty 1

## 2020-07-07 MED ORDER — INSULIN GLARGINE 100 UNIT/ML ~~LOC~~ SOLN
15.0000 [IU] | Freq: Every day | SUBCUTANEOUS | Status: DC
  Administered 2020-07-07: 15 [IU] via SUBCUTANEOUS
  Filled 2020-07-07 (×2): qty 0.15

## 2020-07-07 MED ORDER — PANTOPRAZOLE SODIUM 40 MG PO TBEC
40.0000 mg | DELAYED_RELEASE_TABLET | Freq: Every day | ORAL | Status: DC
  Administered 2020-07-07 – 2020-07-09 (×3): 40 mg via ORAL
  Filled 2020-07-07 (×3): qty 1

## 2020-07-07 MED ORDER — FUROSEMIDE 10 MG/ML IJ SOLN: 20.0000 mg | Freq: Once | INTRAMUSCULAR | Status: DC

## 2020-07-07 MED ORDER — INSULIN GLARGINE 100 UNIT/ML ~~LOC~~ SOLN
100.0000 [IU] | Freq: Every day | SUBCUTANEOUS | Status: DC
  Filled 2020-07-07: qty 1

## 2020-07-07 MED ORDER — ONDANSETRON HCL 4 MG PO TABS: 4.0000 mg | ORAL_TABLET | Freq: Four times a day (QID) | ORAL | Status: DC | PRN

## 2020-07-07 MED ORDER — ACETAMINOPHEN 650 MG RE SUPP: 650.0000 mg | Freq: Four times a day (QID) | RECTAL | Status: DC | PRN

## 2020-07-07 MED ORDER — FUROSEMIDE 10 MG/ML IJ SOLN
20.0000 mg | Freq: Two times a day (BID) | INTRAMUSCULAR | Status: DC
  Administered 2020-07-07: 20 mg via INTRAVENOUS
  Filled 2020-07-07: qty 2

## 2020-07-07 MED ORDER — FUROSEMIDE 10 MG/ML IJ SOLN
20.0000 mg | Freq: Once | INTRAMUSCULAR | Status: AC
  Administered 2020-07-07: 20 mg via INTRAVENOUS
  Filled 2020-07-07: qty 4

## 2020-07-07 MED ORDER — CARVEDILOL 6.25 MG PO TABS
6.2500 mg | ORAL_TABLET | Freq: Two times a day (BID) | ORAL | Status: DC
  Administered 2020-07-07 – 2020-07-09 (×5): 6.25 mg via ORAL
  Filled 2020-07-07 (×5): qty 1

## 2020-07-07 MED ORDER — CYANOCOBALAMIN 500 MCG PO TABS
1000.0000 ug | ORAL_TABLET | Freq: Every day | ORAL | Status: DC
  Administered 2020-07-07 – 2020-07-08 (×2): 1000 ug via ORAL
  Filled 2020-07-07 (×4): qty 2

## 2020-07-07 MED ORDER — MAGNESIUM SULFATE 2 GM/50ML IV SOLN
2.0000 g | Freq: Once | INTRAVENOUS | Status: AC
  Administered 2020-07-07: 2 g via INTRAVENOUS
  Filled 2020-07-07: qty 50

## 2020-07-07 NOTE — H&P (Signed)
[place]   PATIENT NAME: Andrea Lawson    MR#:  [id number]  DATE OF BIRTH:  1931/12/16  DATE OF ADMISSION:  07/07/2020  PRIMARY CARE PHYSICIAN: Adin Hector, MD   Patient is coming from: SNF on the independent living part  REQUESTING/REFERRING PHYSICIAN: Rudene Re, MD  CHIEF COMPLAINT:   Chief Complaint  Patient presents with  . Anxiety    HISTORY OF PRESENT ILLNESS:  Andrea Lawson is a 87 y.o. Caucasian female with medical history significant for multiple problems that are mentioned below, including type 2 diabetes mellitus and hypertension, who presented to the emergency room with acute onset of dyspnea with associated orthopnea.  The patient apparently lost her balance and fell on her way to the bathroom without injuries.  She could not get up and felt very anxious she was going to fall again.  She crawled back to her bed and called for help.  The staff called EMS.  When EMS arrived she was hypoxic with a pulse oximetry in the mid 80s.  She denied any paresthesias or focal muscle weakness.  No chest pain or palpitations.  She has been having cough and wheezing with her dyspnea.  No dysuria, oliguria or hematuria or flank pain.  No headache or dizziness or blurred vision.. ED Course: Upon presentation to the ER, blood pressure was 191/108 with a heart rate of 102 and pulse symmetry of 88% on room air with a temperature of 90.2 and respiratory rate of 10.  Oximetry was 90% on 2 L O2 by nasal cannula.  Labs revealed mild hypokalemia of 3.4 and blood glucose was 181.  Magnesium was 1.7 and high-sensitivity troponin I was 114.   EKG as reviewed by me : Showed sinus rhythm with rate 86 with PACs, left atrial enlargement and right bundle branch block with old inferior Q's. Imaging: Portable chest x-ray showed interval development of mild diffuse interstitial pulmonary infiltrate, most in keeping with mild pulmonary edema, possibly cardiogenic in nature CTA of the chest  revealed: 1. CHF. 2. Negative for pulmonary embolism. 3. 4 cm diameter ascending aorta. Recommend annual imaging followup by CTA or MRA.  The patient was given 20 mg of IV Lasix and DuoNeb.  She will be admitted to a progressive unit bed for further evaluation and management of suspected new onset acute CHF. PAST MEDICAL HISTORY:   Past Medical History:  Diagnosis Date  . Allergic state   . Anemia    IRON DEF.ANEMIA  . Arthritis    OSTEOARTHRITIS OF BIL.KNEES  . B12 deficiency   . Basal cell carcinoma   . Callus of foot    bil.  . Cancer ([place])    of CECUM  . Cataract cortical, senile    RIGHT EYE  . Cholesterol-lowering agent myopathy 2004  . Diabetes mellitus without complication ([place])   . Diabetic neuropathy ([place])   . Diverticulosis   . Drug-induced myopathy   . Eczema   . GERD (gastroesophageal reflux disease)   . Gout   . Hemorrhoids   . History of chicken pox   . History of hypercalcemia   . Hypercholesterolemia   . Hyperparathyroidism ([place])   . Hypertension   . Hypothyroidism   . Incontinent of urine   . Lumbago   . Mycotic toenails   . Neuropathy due to secondary diabetes mellitus ([place])     PAST SURGICAL HISTORY:   Past Surgical History:  Procedure Laterality Date  . BUNIONECTOMY  2006  . COLONOSCOPY    . COLONOSCOPY WITH PROPOFOL N/A 12/09/2014   Procedure: COLONOSCOPY WITH PROPOFOL;  Surgeon: Lollie Sails, MD;  Location: [facility] ENDOSCOPY;  Service: Endoscopy;  Laterality: N/A;  . COLONOSCOPY WITH PROPOFOL N/A 05/03/2017   Procedure: COLONOSCOPY WITH PROPOFOL;  Surgeon: Lollie Sails, MD;  Location: [facility] ENDOSCOPY;  Service: Endoscopy;  Laterality: N/A;  . DEBRIDEMENT OF CALLUSES Bilateral    FEET  . DIAGNOSTIC LAPAROSCOPY    . ESOPHAGOGASTRODUODENOSCOPY    . EYE SURGERY    . GOUT SURGERY    . KNEE ARTHROSCOPY    . LAPAROSCOPIC PARTIAL COLECTOMY Right 03/16/2010  . MOHS SURGERY  12/2008  . MOUTH SURGERY    . PARATHYROIDECTOMY  2001  .  TONSILLECTOMY      SOCIAL HISTORY:   Social History   Tobacco Use  . Smoking status: Former Research scientist (life sciences)  . Smokeless tobacco: Never Used  Substance Use Topics  . Alcohol use: Yes    Comment: WINE    FAMILY HISTORY:  No family history on file.  DRUG ALLERGIES:   Allergies  Allergen Reactions  . Simvastatin     Other reaction(s): Other (See Comments) Other Reaction: Myositis  . Ace Inhibitors     Other reaction(s): Cough  . Angiotensin Receptor Blockers Cough    Other reaction(s): Cough  . Losartan     Other reaction(s): Unknown  . Penicillins Rash    Other reaction(s): Other (See Comments) Other Reaction: ALLERGY     REVIEW OF SYSTEMS:   ROS As per history of present illness. All pertinent systems were reviewed above. Constitutional, HEENT, cardiovascular, respiratory, GI, GU, musculoskeletal, neuro, psychiatric, endocrine, integumentary and hematologic systems were reviewed and are otherwise negative/unremarkable except for positive findings mentioned above in the HPI.   MEDICATIONS AT HOME:   Prior to Admission medications   Medication Sig Start Date End Date Taking? Authorizing Provider  carvedilol (COREG) 6.25 MG tablet Take 6.25 mg by mouth 2 (two) times daily with a meal.    [provider]  cholecalciferol (VITAMIN D) 400 UNITS TABS tablet Take 1,000 Units by mouth.    [provider]  Cyanocobalamin (VITAMIN B 12 PO) Take 1,000 mcg by mouth daily.    [provider]  GAVILYTE-N WITH FLAVOR PACK 420 G solution  10/29/14   [provider]  insulin degludec (TRESIBA FLEXTOUCH) 100 UNIT/ML SOPN FlexTouch Pen Inject 100 Units into the skin daily at 10 pm.    [provider]  lactase-rennet 25-2 MG tablet Take 1 tablet by mouth 3 (three) times daily with meals.    [provider]  LANTUS SOLOSTAR 100 UNIT/ML Solostar Pen  12/14/14   [provider]  loperamide (IMODIUM) 2 MG capsule Take by mouth as needed  for diarrhea or loose stools.    [provider]  magnesium oxide (MAG-OX) 400 MG tablet Take 400 mg by mouth daily.    [provider]  metFORMIN (GLUCOPHAGE) 500 MG tablet Take by mouth 2 (two) times daily with a meal.    [provider]  Multiple Vitamins-Minerals (ANTIOXIDANT PO) Take 2 tablets by mouth daily.    [provider]  omeprazole (PRILOSEC) 20 MG capsule Take 20 mg by mouth daily.    [provider]  oxybutynin (DITROPAN) 5 MG tablet Take 5 mg by mouth 2 (two) times daily.    [provider]  Probiotic Product (HEALTHY COLON PO) Take 1 capsule by mouth daily.  [provider]      VITAL SIGNS:  Blood pressure (!) 130/112, pulse 86, temperature 98.2 F (36.8 C), temperature source Oral, resp. rate 17, height 5' (1.524 m), weight 63.5 kg, SpO2 98 %.  PHYSICAL EXAMINATION:  Physical Exam  GENERAL:  87 y.o.-year-old Caucasian female patient lying in the bed with mild respiratory distress with conversational dyspnea. EYES: Pupils equal, round, reactive to light and accommodation. No scleral icterus. Extraocular muscles intact.  HEENT: Head atraumatic, normocephalic. Oropharynx and nasopharynx clear.  NECK:  Supple, no jugular venous distention. No thyroid enlargement, no tenderness.  LUNGS: Slightly diminished bibasal breath sounds with bibasal rales.  No use of accessory muscles of respiration.  CARDIOVASCULAR: Regular rate and rhythm, S1, S2 normal. No murmurs, rubs, or gallops.  ABDOMEN: Soft, nondistended, nontender. Bowel sounds present. No organomegaly or mass.  EXTREMITIES: Bilateral lower extremity 1+ pitting edema with no cyanosis, or clubbing.  NEUROLOGIC: Cranial nerves II through XII are intact. Muscle strength 5/5 in all extremities. Sensation intact. Gait not checked.  PSYCHIATRIC: The patient is alert and oriented x 3.  Normal affect and good eye contact. SKIN: No obvious rash, lesion, or ulcer.    LABORATORY PANEL:   CBC Recent Labs  Lab 07/07/20 0133  WBC 9.8  HGB 11.1*  HCT 37.4  PLT 148*   ------------------------------------------------------------------------------------------------------------------  Chemistries  Recent Labs  Lab 07/07/20 0246  NA 140  K 3.4*  CL 104  CO2 25  GLUCOSE 181*  BUN 11  CREATININE 0.62  CALCIUM 8.4*   ------------------------------------------------------------------------------------------------------------------  Cardiac Enzymes No results for input(s): TROPONINI in the last 168 hours. ------------------------------------------------------------------------------------------------------------------  RADIOLOGY:  CT Angio Chest PE W and/or Wo Contrast  Result Date: 07/07/2020 CLINICAL DATA:  High probability for pulmonary embolism. EXAM: CT ANGIOGRAPHY CHEST WITH CONTRAST TECHNIQUE: Multidetector CT imaging of the chest was performed using the standard protocol during bolus administration of intravenous contrast. Multiplanar CT image reconstructions and MIPs were obtained to evaluate the vascular anatomy. CONTRAST:  131mL OMNIPAQUE IOHEXOL 350 MG/ML SOLN COMPARISON:  None. FINDINGS: Cardiovascular: Cardiomegaly. No pericardial effusion. Extensive coronary atherosclerosis. Bulky mitral annular calcification. No pulmonary artery filling defect. Aneurysmal ascending aorta measuring up to 4 cm in diameter. Nearly nonenhanced aorta. There are a few dark lines over the aorta which is attributed to streak artifact based on reformats. There is an otherwise homogeneous density to the aorta which would not be expected for dissection or intramural hematoma. No noncontrast scout images are available. No pulmonary artery filling defect Mediastinum/Nodes: Granulomatous nodal calcifications. Lungs/Pleura: Septal thickening and small pleural effusions with generalized airway cuffing. No focal consolidation. Pulmonary microlithiasis in the dependent  right lower lobe. Upper Abdomen: Granulomatous calcifications in the spleen. Atherosclerosis. Musculoskeletal: Generalized disc degeneration with exaggerated kyphosis. Review of the MIP images confirms the above findings. IMPRESSION: 1. CHF. 2. Negative for pulmonary embolism. 3. 4 cm diameter ascending aorta. Recommend annual imaging followup by CTA or MRA. This recommendation follows 2010 ACCF/AHA/AATS/ACR/ASA/SCA/SCAI/SIR/STS/SVM Guidelines for the Diagnosis and Management of Patients with Thoracic Aortic Disease. Circulation. 2010; 121: T245-Y099 Aortic Atherosclerosis (ICD10-I70.0). Aortic aneurysm NOS (ICD10-I71.9). Electronically Signed   By: Monte Fantasia M.D.   On: 07/07/2020 04:40   DG Chest Portable 1 View  Result Date: 07/07/2020 CLINICAL DATA:  Dyspnea, hypoxia EXAM: PORTABLE CHEST 1 VIEW COMPARISON:  04/10/2010 FINDINGS: There is mild diffuse interstitial pulmonary infiltrate with pulmonary vascular redistribution of the lung apices most in keeping with mild diffuse interstitial pulmonary edema, possibly cardiogenic in nature. No  pneumothorax or pleural effusion. Cardiac size within normal limits. No acute bone abnormality. IMPRESSION: Interval development of mild diffuse interstitial pulmonary infiltrate, most in keeping with mild pulmonary edema, possibly cardiogenic in nature. Electronically Signed   By: Fidela Salisbury MD   On: 07/07/2020 01:50      IMPRESSION AND PLAN:  Active Problems:   Acute CHF (congestive heart failure) ([place])  1.  Acute new onset CHF, likely diastolic with subsequent acute hypoxic respiratory failure. -The patient will be admitted to a progressive cardiac unit bed. -We will continue diuresis with IV Lasix. -We will follow serial troponin I's. -We will check TSH and replace potassium and magnesium. -Obtain 2D echo and cardiology consult. -I notified Dr. Clayborn Bigness about the patient.  2.  Essential hypertension. -We will continue her Coreg.  3.  Type 2  diabetes mellitus. -We will place on supplemental coverage with NovoLog and continue basal coverage. -We will hold off Metformin.  4.  GERD. -We will continue PPI therapy.  5.  Overactive bladder. -We will continue Ditropan.  DVT prophylaxis: Lovenox. Code Status: full code. Family Communication:  The plan of care was discussed in details with the patient (with no family at bedside). I answered all questions. The patient agreed to proceed with the above mentioned plan. Further management will depend upon hospital course. Disposition Plan: Back to previous home environment Consults called: Cardiology consult. All the records are reviewed and case discussed with ED provider.  Status is: Inpatient  Remains inpatient appropriate because:Altered mental status, Ongoing diagnostic testing needed not appropriate for outpatient work up, Unsafe d/c plan, IV treatments appropriate due to intensity of illness or inability to take PO and Inpatient level of care appropriate due to severity of illness   Dispo: The patient is from: SNF              Anticipated d/c is to: SNF              Patient currently is not medically stable to d/c.   Difficult to place patient No   TOTAL TIME TAKING CARE OF THIS PATIENT: 55 minutes.    Christel Mormon M.D on 07/07/2020 at 5:03 AM  [facility]   From 7 PM-7 AM, contact night-coverage [email or web address]  CC: Primary care physician; Adin Hector, MD

## 2020-07-07 NOTE — ED Triage Notes (Addendum)
'  I lost my balance, and slipped using my Faso to the bathroom, I thought I was going to lay on the ground all night and became shob, I have been coughing last few days.' pt talking in complete sentences in no apparent distress. Ems reports pt lives at [facility] and husband level of care upgraded so pt alone, 'house has a lot of trash and debris' and staff concerned pt has 'developing dementia'

## 2020-07-07 NOTE — Consult Note (Signed)
[facility] Consultation Note  Patient ID: RONALEE SCHEUNEMANN, MRN: [id number], DOB/AGE: 1931-11-07 87 y.o. Admit date: 07/07/2020   Date of Consult: 07/07/2020 Primary Physician: Adin Hector, MD Primary Cardiologist: None  Chief Complaint:  Chief Complaint  Patient presents with  . Anxiety   Reason for Consult: Heart failure  HPI: 87 y.o. female with known hypertension hyperlipidemia and diabetes who has had recent onset of significant weight gain shortness of breath weakness and fatigue.  The patient was seen in the emergency room with heart failure type symptoms with a BNP of 480, a troponin of 114.  Patient also had an EKG showing normal sinus rhythm with left anterior fascicular block changed from before.  Additionally the patient has had a chest x-ray showing pulmonary edema and CT scan showing coronary artery atherosclerosis with congestive heart failure.  She has received Lasix which is impressively bruising her symptoms of shortness of breath in the last many hours.  The patient has felt much better and oxygenating much better.  She remains on carvedilol for apparent congestive heart failure and now has had better blood pressure control.  She currently is hemodynamically stable without evidence of acute coronary syndrome or true angina  Past Medical History:  Diagnosis Date  . Allergic state   . Anemia    IRON DEF.ANEMIA  . Arthritis    OSTEOARTHRITIS OF BIL.KNEES  . B12 deficiency   . Basal cell carcinoma   . Callus of foot    bil.  . Cancer ([place])    of CECUM  . Cataract cortical, senile    RIGHT EYE  . Cholesterol-lowering agent myopathy 2004  . Diabetes mellitus without complication ([place])   . Diabetic neuropathy ([place])   . Diverticulosis   . Drug-induced myopathy   . Eczema   . GERD (gastroesophageal reflux disease)   . Gout   . Hemorrhoids   . History of chicken pox   . History of hypercalcemia   . Hypercholesterolemia   . Hyperparathyroidism ([place])    . Hypertension   . Hypothyroidism   . Incontinent of urine   . Lumbago   . Mycotic toenails   . Neuropathy due to secondary diabetes mellitus [facility])       Surgical History:  Past Surgical History:  Procedure Laterality Date  . BUNIONECTOMY  2006  . COLONOSCOPY    . COLONOSCOPY WITH PROPOFOL N/A 12/09/2014   Procedure: COLONOSCOPY WITH PROPOFOL;  Surgeon: Lollie Sails, MD;  Location: [facility] ENDOSCOPY;  Service: Endoscopy;  Laterality: N/A;  . COLONOSCOPY WITH PROPOFOL N/A 05/03/2017   Procedure: COLONOSCOPY WITH PROPOFOL;  Surgeon: Lollie Sails, MD;  Location: [facility] ENDOSCOPY;  Service: Endoscopy;  Laterality: N/A;  . DEBRIDEMENT OF CALLUSES Bilateral    FEET  . DIAGNOSTIC LAPAROSCOPY    . ESOPHAGOGASTRODUODENOSCOPY    . EYE SURGERY    . GOUT SURGERY    . KNEE ARTHROSCOPY    . LAPAROSCOPIC PARTIAL COLECTOMY Right 03/16/2010  . MOHS SURGERY  12/2008  . MOUTH SURGERY    . PARATHYROIDECTOMY  2001  . TONSILLECTOMY       Home Meds: Prior to Admission medications   Medication Sig Start Date End Date Taking? Authorizing Provider  calcium carbonate (TUMS EX) 750 MG chewable tablet Chew 1 tablet by mouth every 2 (two) hours as needed for heartburn.   Yes [provider]  carvedilol (COREG) 6.25 MG tablet Take 6.25 mg by mouth 2 (two) times daily with a  meal.   Yes [provider]  cholecalciferol (VITAMIN D) 400 UNITS TABS tablet Take 1,000 Units by mouth.   Yes [provider]  Cyanocobalamin (VITAMIN B 12 PO) Take 1,000 mcg by mouth daily.   Yes [provider]  insulin degludec (TRESIBA) 100 UNIT/ML FlexTouch Pen Inject 15 Units into the skin daily at 10 pm.   Yes [provider]  lactase-rennet 25-2 MG tablet Take 1 tablet by mouth daily as needed.   Yes [provider]  latanoprost (XALATAN) 0.005 % ophthalmic solution Place 1 drop into both eyes every evening. 06/16/20  Yes [provider]  loperamide (IMODIUM) 2  MG capsule Take by mouth as needed for diarrhea or loose stools.   Yes [provider]  magnesium oxide (MAG-OX) 400 MG tablet Take 400 mg by mouth daily.   Yes [provider]  oxybutynin (DITROPAN) 5 MG tablet Take 5 mg by mouth 2 (two) times daily.   Yes [provider]  Probiotic Product (HEALTHY COLON PO) Take 1 capsule by mouth daily.   Yes [provider]  GAVILYTE-N WITH FLAVOR PACK 420 G solution  10/29/14   [provider]  LANTUS SOLOSTAR 100 UNIT/ML Solostar Pen  12/14/14   [provider]  metFORMIN (GLUCOPHAGE) 500 MG tablet Take 500 mg by mouth 2 (two) times daily with a meal.    [provider]  Multiple Vitamins-Minerals (ANTIOXIDANT PO) Take 2 tablets by mouth daily. Patient not taking: Reported on 07/07/2020    [provider]  omeprazole (PRILOSEC) 20 MG capsule Take 20 mg by mouth daily. Patient not taking: Reported on 07/07/2020    [provider]    Inpatient Medications:  . aspirin EC  81 mg Oral Daily  . carvedilol  6.25 mg Oral BID WC  . cholecalciferol  1,000 Units Oral Daily  . enoxaparin (LOVENOX) injection  40 mg Subcutaneous Q24H  . furosemide  40 mg Intravenous Q12H  . insulin aspart  0-9 Units Subcutaneous TID PC & HS  . insulin glargine  15 Units Subcutaneous QHS  . multivitamin-lutein   Oral Daily  . pantoprazole  40 mg Oral Daily  . potassium chloride  40 mEq Oral Once  . vitamin B-12  1,000 mcg Oral Daily     Allergies:  Allergies  Allergen Reactions  . Simvastatin     Other reaction(s): Other (See Comments) Other Reaction: Myositis  . Ace Inhibitors     Other reaction(s): Cough  . Angiotensin Receptor Blockers Cough    Other reaction(s): Cough  . Losartan     Other reaction(s): Unknown  . Penicillins Rash    Other reaction(s): Other (See Comments) Other Reaction: ALLERGY     Social History   Socioeconomic History  . Marital status: Married    Spouse name:  Not on file  . Number of children: Not on file  . Years of education: Not on file  . Highest education level: Not on file  Occupational History  . Not on file  Tobacco Use  . Smoking status: Former Research scientist (life sciences)  . Smokeless tobacco: Never Used  Vaping Use  . Vaping Use: Never used  Substance and Sexual Activity  . Alcohol use: Yes    Comment: WINE  . Drug use: No  . Sexual activity: Not on file  Other Topics Concern  . Not on file  Social History Narrative  . Not on file   Social Determinants of Health   Financial Resource  Strain: Not on file  Food Insecurity: Not on file  Transportation Needs: Not on file  Physical Activity: Not on file  Stress: Not on file  Social Connections: Not on file  Intimate Partner Violence: Not on file     History reviewed. No pertinent family history.   Review of Systems Positive for shortness of breath Negative for: General:  chills, fever, night sweats or weight changes.  Cardiovascular: PND orthopnea syncope dizziness  Dermatological skin lesions rashes Respiratory: Cough congestion Urologic: Frequent urination urination at night and hematuria Abdominal: negative for nausea, vomiting, diarrhea, bright red blood per rectum, melena, or hematemesis Neurologic: negative for visual changes, and/or hearing changes  All other systems reviewed and are otherwise negative except as noted above.  Labs: No results for input(s): CKTOTAL, CKMB, TROPONINI in the last 72 hours. Lab Results  Component Value Date   WBC 9.8 07/07/2020   HGB 11.1 (L) 07/07/2020   HCT 37.4 07/07/2020   MCV 88.8 07/07/2020   PLT 148 (L) 07/07/2020    Recent Labs  Lab 07/07/20 0246  NA 140  K 3.4*  CL 104  CO2 25  BUN 11  CREATININE 0.62  CALCIUM 8.4*  GLUCOSE 181*   No results found for: CHOL, HDL, LDLCALC, TRIG No results found for: DDIMER  Radiology/Studies:  CT Angio Chest PE W and/or Wo Contrast  Result Date: 07/07/2020 CLINICAL DATA:  High probability  for pulmonary embolism. EXAM: CT ANGIOGRAPHY CHEST WITH CONTRAST TECHNIQUE: Multidetector CT imaging of the chest was performed using the standard protocol during bolus administration of intravenous contrast. Multiplanar CT image reconstructions and MIPs were obtained to evaluate the vascular anatomy. CONTRAST:  182mL OMNIPAQUE IOHEXOL 350 MG/ML SOLN COMPARISON:  None. FINDINGS: Cardiovascular: Cardiomegaly. No pericardial effusion. Extensive coronary atherosclerosis. Bulky mitral annular calcification. No pulmonary artery filling defect. Aneurysmal ascending aorta measuring up to 4 cm in diameter. Nearly nonenhanced aorta. There are a few dark lines over the aorta which is attributed to streak artifact based on reformats. There is an otherwise homogeneous density to the aorta which would not be expected for dissection or intramural hematoma. No noncontrast scout images are available. No pulmonary artery filling defect Mediastinum/Nodes: Granulomatous nodal calcifications. Lungs/Pleura: Septal thickening and small pleural effusions with generalized airway cuffing. No focal consolidation. Pulmonary microlithiasis in the dependent right lower lobe. Upper Abdomen: Granulomatous calcifications in the spleen. Atherosclerosis. Musculoskeletal: Generalized disc degeneration with exaggerated kyphosis. Review of the MIP images confirms the above findings. IMPRESSION: 1. CHF. 2. Negative for pulmonary embolism. 3. 4 cm diameter ascending aorta. Recommend annual imaging followup by CTA or MRA. This recommendation follows 2010 ACCF/AHA/AATS/ACR/ASA/SCA/SCAI/SIR/STS/SVM Guidelines for the Diagnosis and Management of Patients with Thoracic Aortic Disease. Circulation. 2010; 121: J191-Y782 Aortic Atherosclerosis (ICD10-I70.0). Aortic aneurysm NOS (ICD10-I71.9). Electronically Signed   By: Monte Fantasia M.D.   On: 07/07/2020 04:40   DG Chest Portable 1 View  Result Date: 07/07/2020 CLINICAL DATA:  Dyspnea, hypoxia EXAM:  PORTABLE CHEST 1 VIEW COMPARISON:  04/10/2010 FINDINGS: There is mild diffuse interstitial pulmonary infiltrate with pulmonary vascular redistribution of the lung apices most in keeping with mild diffuse interstitial pulmonary edema, possibly cardiogenic in nature. No pneumothorax or pleural effusion. Cardiac size within normal limits. No acute bone abnormality. IMPRESSION: Interval development of mild diffuse interstitial pulmonary infiltrate, most in keeping with mild pulmonary edema, possibly cardiogenic in nature. Electronically Signed   By: Fidela Salisbury MD   On: 07/07/2020 01:50    EKG: Normal sinus rhythm with left  anterior fascicular block  Weights: Filed Weights   07/07/20 0110 07/07/20 1259  Weight: 63.5 kg 56.3 kg     Physical Exam: Blood pressure 125/86, pulse 75, temperature (!) 97.5 F (36.4 C), temperature source Oral, resp. rate 18, height 5' (1.524 m), weight 56.3 kg, SpO2 92 %. Body mass index is 24.24 kg/m. General: Well developed, well nourished, in no acute distress. Head eyes ears nose throat: Normocephalic, atraumatic, sclera non-icteric, no xanthomas, nares are without discharge. No apparent thyromegaly and/or mass  Lungs: Normal respiratory effort.  no wheezes, few basilar rales, no rhonchi.  Heart: RRR with normal S1 S2. no murmur gallop, no rub, PMI is normal size and placement, carotid upstroke normal without bruit, jugular venous pressure is normal Abdomen: Soft, non-tender, non-distended with normoactive bowel sounds. No hepatomegaly. No rebound/guarding. No obvious abdominal masses. Abdominal aorta is normal size without bruit Extremities: Trace edema. no cyanosis, no clubbing, no ulcers  Peripheral : 2+ bilateral upper extremity pulses, 2+ bilateral femoral pulses, 2+ bilateral dorsal pedal pulse Neuro: Alert and oriented. No facial asymmetry. No focal deficit. Moves all extremities spontaneously. Musculoskeletal: Normal muscle tone without kyphosis Psych:   Responds to questions appropriately with a normal affect.    Assessment: 87 year old female with acute systolic dysfunction congestive heart failure with coronary artery atherosclerosis hypertension hyperlipidemia and diabetes with abnormal EKG slightly improved at this time with intravenous Lasix without evidence of acute coronary syndrome  Plan: 1.  Continue intravenous Lasix watching for any acute kidney injury for acute systolic dysfunction congestive heart failure 2.  Echocardiogram for LV systolic dysfunction valvular heart disease contributing to it 3.  Carvedilol for hypertension control with the potential addition of angiotensin receptor blocker for congestive heart failure and hypertension control 4.  Further diagnostic testing and/or treatment after above below  Signed, Corey Skains M.D. [facility] 07/07/2020, 1:42 PM

## 2020-07-07 NOTE — ED Notes (Signed)
Graham crackers provided

## 2020-07-07 NOTE — Progress Notes (Signed)
[facility] [place] NAME: Kela Baccari    MR#:  [id number]  DATE OF BIRTH:  [birth year]  SUBJECTIVE:  came in from [facility] independent living after she slid off and landed upon the floor. No trauma. She crawled and called staff to help her. She was brought to the emergency room found to be in congestive heart failure. Currently on room air oxygen sats are 94%. Denies chest pain. Some shortness of breath and leg swelling.  REVIEW OF SYSTEMS:   Review of Systems  Constitutional: Negative for chills, fever and weight loss.  HENT: Negative for ear discharge, ear pain and nosebleeds.   Eyes: Negative for blurred vision, pain and discharge.  Respiratory: Negative for sputum production, shortness of breath, wheezing and stridor.   Cardiovascular: Negative for chest pain, palpitations, orthopnea and PND.  Gastrointestinal: Negative for abdominal pain, diarrhea, nausea and vomiting.  Genitourinary: Negative for frequency and urgency.  Musculoskeletal: Negative for back pain and joint pain.  Neurological: Negative for sensory change, speech change, focal weakness and weakness.  Psychiatric/Behavioral: Negative for depression and hallucinations. The patient is not nervous/anxious.    Tolerating Diet:yes Tolerating PT: pending  DRUG ALLERGIES:   Allergies  Allergen Reactions  . Simvastatin     Other reaction(s): Other (See Comments) Other Reaction: Myositis  . Ace Inhibitors     Other reaction(s): Cough  . Angiotensin Receptor Blockers Cough    Other reaction(s): Cough  . Losartan     Other reaction(s): Unknown  . Penicillins Rash    Other reaction(s): Other (See Comments) Other Reaction: ALLERGY     VITALS:  Blood pressure 125/86, pulse 75, temperature (!) 97.5 F (36.4 C), temperature source Oral, resp. rate 18, height 5' (1.524 m), weight 56.3 kg, SpO2 92 %.  PHYSICAL EXAMINATION:   Physical Exam  GENERAL:  87 y.o.-year-old  patient lying in the bed with no acute distress.  LUNGS: decreased breath sounds bilaterally, no wheezing,few bibasilar rales, rhonchi. No use of accessory muscles of respiration.  CARDIOVASCULAR: S1, S2 normal. No murmurs, rubs, or gallops.  ABDOMEN: Soft, nontender, nondistended. Bowel sounds present. No organomegaly or mass.  EXTREMITIES: No cyanosis, clubbing  + edema b/l.    NEUROLOGIC: non focal, weak, deconditoned PSYCHIATRIC:  patient is alert and oriented x 3.  SKIN: No obvious rash, lesion, or ulcer.   LABORATORY PANEL:  CBC Recent Labs  Lab 07/07/20 0133  WBC 9.8  HGB 11.1*  HCT 37.4  PLT 148*    Chemistries  Recent Labs  Lab 07/07/20 0246 07/07/20 0448  NA 140  --   K 3.4*  --   CL 104  --   CO2 25  --   GLUCOSE 181*  --   BUN 11  --   CREATININE 0.62  --   CALCIUM 8.4*  --   MG  --  1.7   Cardiac Enzymes No results for input(s): TROPONINI in the last 168 hours. RADIOLOGY:  CT Angio Chest PE W and/or Wo Contrast  Result Date: 07/07/2020 CLINICAL DATA:  High probability for pulmonary embolism. EXAM: CT ANGIOGRAPHY CHEST WITH CONTRAST TECHNIQUE: Multidetector CT imaging of the chest was performed using the standard protocol during bolus administration of intravenous contrast. Multiplanar CT image reconstructions and MIPs were obtained to evaluate the vascular anatomy. CONTRAST:  125mL OMNIPAQUE IOHEXOL 350 MG/ML SOLN COMPARISON:  None. FINDINGS: Cardiovascular: Cardiomegaly. No pericardial effusion. Extensive coronary atherosclerosis. Bulky mitral annular calcification. No pulmonary artery  filling defect. Aneurysmal ascending aorta measuring up to 4 cm in diameter. Nearly nonenhanced aorta. There are a few dark lines over the aorta which is attributed to streak artifact based on reformats. There is an otherwise homogeneous density to the aorta which would not be expected for dissection or intramural hematoma. No noncontrast scout images are available. No pulmonary  artery filling defect Mediastinum/Nodes: Granulomatous nodal calcifications. Lungs/Pleura: Septal thickening and small pleural effusions with generalized airway cuffing. No focal consolidation. Pulmonary microlithiasis in the dependent right lower lobe. Upper Abdomen: Granulomatous calcifications in the spleen. Atherosclerosis. Musculoskeletal: Generalized disc degeneration with exaggerated kyphosis. Review of the MIP images confirms the above findings. IMPRESSION: 1. CHF. 2. Negative for pulmonary embolism. 3. 4 cm diameter ascending aorta. Recommend annual imaging followup by CTA or MRA. This recommendation follows 2010 ACCF/AHA/AATS/ACR/ASA/SCA/SCAI/SIR/STS/SVM Guidelines for the Diagnosis and Management of Patients with Thoracic Aortic Disease. Circulation. 2010; 121: [id number] Aortic Atherosclerosis (ICD10-I70.0). Aortic aneurysm NOS (ICD10-I71.9). Electronically Signed   By: Monte Fantasia M.D.   On: 07/07/2020 04:40   DG Chest Portable 1 View  Result Date: 07/07/2020 CLINICAL DATA:  Dyspnea, hypoxia EXAM: PORTABLE CHEST 1 VIEW COMPARISON:  04/10/2010 FINDINGS: There is mild diffuse interstitial pulmonary infiltrate with pulmonary vascular redistribution of the lung apices most in keeping with mild diffuse interstitial pulmonary edema, possibly cardiogenic in nature. No pneumothorax or pleural effusion. Cardiac size within normal limits. No acute bone abnormality. IMPRESSION: Interval development of mild diffuse interstitial pulmonary infiltrate, most in keeping with mild pulmonary edema, possibly cardiogenic in nature. Electronically Signed   By: Fidela Salisbury MD   On: 07/07/2020 01:50   ASSESSMENT AND PLAN:  Andrea Lawson is a 87 y.o. Caucasian female with medical history significant for multiple problems that are mentioned below, including type 2 diabetes mellitus and hypertension, who presented to the emergency room with acute onset of dyspnea with associated orthopnea.  The patient apparently lost  her balance and fell on her way to the bathroom without injuries.  Acute new onset CHF, likely diastolic with subsequent acute hypoxic respiratory failure. - continue diuresis with IV Lasix. Patient currently sats are 9497% on room air. -- BNP 480 -- chest x-ray shows pulmonary vascular congestion -mild elevated troponin suspect demand ischemia. Patient denies chest pain -Obtain 2D echo and cardiology consult with Dr. Nehemiah Massed appreciated   Essential hypertension. - continue her Coreg.  Type 2 diabetes mellitus. - on supplemental coverage with NovoLog and continue basal coverage. -hold off Metformin.   GERD. -continue PPI therapy.    Overactive bladder. -continue Ditropan.  Generalized deconditioning with fall -- PT OT to see patient -- TOC for discharge planning. Patient currently is at independent living with [facility]. She is going to be moved to the higher level of care along with her husband according to patient. Will have TOC look into it.  DVT prophylaxis: Lovenox. Code Status: full code. Family Communication: [name] dter on phone  Disposition Plan: Back to previous home environment Consults called: Cardiology consult. Level of care: Progressive Cardiac Status is: Inpatient  Remains inpatient appropriate because:Inpatient level of care appropriate due to severity of illness   Dispo: The patient is from: Home              Anticipated d/c is to: SNF              Patient currently is not medically stable to d/c.   Difficult to place patient No        TOTAL  TIME TAKING CARE OF THIS PATIENT: 25 minutes.  >50% time spent on counselling and coordination of care  Note: This dictation was prepared with Dragon dictation along with smaller phrase technology. Any transcriptional errors that result from this process are unintentional.  Fritzi Mandes M.D    [facility]   CC: Primary care physician; Adin Hector, MDPatient ID: Hermelinda Dellen, female    DOB: 09/02/1931, 87 y.o.   MRN: [id number]

## 2020-07-07 NOTE — ED Notes (Signed)
Attempted to visit for routine leader rounds for those waiting on admission. Staff currently in room providing care.

## 2020-07-07 NOTE — ED Provider Notes (Signed)
[facility] Emergency Department Provider Note  ____________________________________________  Time seen: Approximately 2:48 AM  I have reviewed the triage vital signs and the nursing notes.   HISTORY  Chief Complaint Anxiety   HPI Andrea Lawson is a 87 y.o. female with a history of anemia, arthritis, remote cecum cancer, diabetes, hypertension, hypothyroidism who presents for evaluation of shortness of breath.  Patient reports being in her usual state of health when she went to bed this evening.  She woke up to go to the bathroom.  She lives in a skilled facility but on the independent living part of it.  On her way to the bathroom she reports that she slipped and fell.  She did not injure herself.  She could not get up and started to feel very anxious that she was going to be on the floor for a long time.  She was able to crawl back to her bed and call for help.  Staff has been concerned that patient has shown early signs of dementia.  They are concerned that she should not be living alone.  Patient reports that while she was on the floor she felt very short of breath which she thought it was because she was anxious about being on the floor all night.  But here she still complained of shortness of breath.  She was found to be satting in the mid 80s when EMS arrived.  She denies head trauma or LOC, neck pain or back pain, extremity pain, chest pain, abdominal pain.  She did not hit her head.   Past Medical History:  Diagnosis Date  . Allergic state   . Anemia    IRON DEF.ANEMIA  . Arthritis    OSTEOARTHRITIS OF BIL.KNEES  . B12 deficiency   . Basal cell carcinoma   . Callus of foot    bil.  . Cancer ([place])    of CECUM  . Cataract cortical, senile    RIGHT EYE  . Cholesterol-lowering agent myopathy 2004  . Diabetes mellitus without complication ([place])   . Diabetic neuropathy ([place])   . Diverticulosis   . Drug-induced myopathy   . Eczema   . GERD  (gastroesophageal reflux disease)   . Gout   . Hemorrhoids   . History of chicken pox   . History of hypercalcemia   . Hypercholesterolemia   . Hyperparathyroidism ([place])   . Hypertension   . Hypothyroidism   . Incontinent of urine   . Lumbago   . Mycotic toenails   . Neuropathy due to secondary diabetes mellitus ([place])     There are no problems to display for this patient.   Past Surgical History:  Procedure Laterality Date  . BUNIONECTOMY  2006  . COLONOSCOPY    . COLONOSCOPY WITH PROPOFOL N/A 12/09/2014   Procedure: COLONOSCOPY WITH PROPOFOL;  Surgeon: Lollie Sails, MD;  Location: [facility] ENDOSCOPY;  Service: Endoscopy;  Laterality: N/A;  . COLONOSCOPY WITH PROPOFOL N/A 05/03/2017   Procedure: COLONOSCOPY WITH PROPOFOL;  Surgeon: Lollie Sails, MD;  Location: [facility] ENDOSCOPY;  Service: Endoscopy;  Laterality: N/A;  . DEBRIDEMENT OF CALLUSES Bilateral    FEET  . DIAGNOSTIC LAPAROSCOPY    . ESOPHAGOGASTRODUODENOSCOPY    . EYE SURGERY    . GOUT SURGERY    . KNEE ARTHROSCOPY    . LAPAROSCOPIC PARTIAL COLECTOMY Right 03/16/2010  . MOHS SURGERY  12/2008  . MOUTH SURGERY    . PARATHYROIDECTOMY  2001  . TONSILLECTOMY  Prior to Admission medications   Medication Sig Start Date End Date Taking? Authorizing Provider  carvedilol (COREG) 6.25 MG tablet Take 6.25 mg by mouth 2 (two) times daily with a meal.    [provider]  cholecalciferol (VITAMIN D) 400 UNITS TABS tablet Take 1,000 Units by mouth.    [provider]  Cyanocobalamin (VITAMIN B 12 PO) Take 1,000 mcg by mouth daily.    [provider]  GAVILYTE-N WITH FLAVOR PACK 420 G solution  10/29/14   [provider]  insulin degludec (TRESIBA FLEXTOUCH) 100 UNIT/ML SOPN FlexTouch Pen Inject 100 Units into the skin daily at 10 pm.    [provider]  lactase-rennet 25-2 MG tablet Take 1 tablet by mouth 3 (three) times daily with meals.    [provider]  LANTUS  SOLOSTAR 100 UNIT/ML Solostar Pen  12/14/14   [provider]  loperamide (IMODIUM) 2 MG capsule Take by mouth as needed for diarrhea or loose stools.    [provider]  magnesium oxide (MAG-OX) 400 MG tablet Take 400 mg by mouth daily.    [provider]  metFORMIN (GLUCOPHAGE) 500 MG tablet Take by mouth 2 (two) times daily with a meal.    [provider]  Multiple Vitamins-Minerals (ANTIOXIDANT PO) Take 2 tablets by mouth daily.    [provider]  omeprazole (PRILOSEC) 20 MG capsule Take 20 mg by mouth daily.    [provider]  oxybutynin (DITROPAN) 5 MG tablet Take 5 mg by mouth 2 (two) times daily.    [provider]  Probiotic Product (HEALTHY COLON PO) Take 1 capsule by mouth daily.    [provider]    Allergies Simvastatin, Ace inhibitors, Angiotensin receptor blockers, Losartan, and Penicillins  No family history on file.  Social History Social History   Tobacco Use  . Smoking status: Former Research scientist (life sciences)  . Smokeless tobacco: Never Used  Vaping Use  . Vaping Use: Never used  Substance Use Topics  . Alcohol use: Yes    Comment: WINE  . Drug use: No    Review of Systems  Constitutional: Negative for fever. Eyes: Negative for visual changes. ENT: Negative for sore throat. Neck: No neck pain  Cardiovascular: Negative for chest pain. Respiratory: + shortness of breath. Gastrointestinal: Negative for abdominal pain, vomiting or diarrhea. Genitourinary: Negative for dysuria. Musculoskeletal: Negative for back pain. Skin: Negative for rash. Neurological: Negative for headaches, weakness or numbness. Psych: No SI or HI  ____________________________________________   PHYSICAL EXAM:  VITAL SIGNS: Vitals:   07/07/20 0230 07/07/20 0300  BP: (!) 155/86 (!) 161/97  Pulse: 85 83  Resp: 14 16  Temp:    SpO2: 98% 98%    Constitutional: Alert and oriented, mild respiratory distress.  HEENT:       Head: Normocephalic and atraumatic.         Eyes: Conjunctivae are normal. Sclera is non-icteric.       Mouth/Throat: Mucous membranes are moist.       Neck: Supple with no signs of meningismus.  No C-spine tenderness Cardiovascular: Regular rate and rhythm. No murmurs, gallops, or rubs. 2+ symmetrical distal pulses are present in all extremities. No JVD. Respiratory: Hypoxic to 86 on room air, wheezing bilaterally, with increased work of breathing and tachypneic Gastrointestinal: Soft, non tender, and non distended. Musculoskeletal:  No edema, cyanosis, or erythema of extremities.  Full painless range of motion of all extremities with no bruising or signs of trauma.  No T and L-spine tenderness Neurologic: Normal speech and language. Face is symmetric. Moving all extremities. No gross focal neurologic deficits are appreciated. Skin: Skin is warm, dry and intact. No rash noted. Psychiatric: Mood and affect are normal. Speech and behavior are normal.  ____________________________________________   LABS (all labs ordered are listed, but only abnormal results are displayed)  Labs Reviewed  CBC WITH DIFFERENTIAL/PLATELET - Abnormal; Notable for the following components:      Result Value   Hemoglobin 11.1 (*)    MCHC 29.7 (*)    Platelets 148 (*)    Neutro Abs 7.8 (*)    All other components within normal limits  BRAIN NATRIURETIC PEPTIDE - Abnormal; Notable for the following components:   B Natriuretic Peptide 480.4 (*)    All other components within normal limits  BASIC METABOLIC PANEL - Abnormal; Notable for the following components:   Potassium 3.4 (*)    Glucose, Bld 181 (*)    Calcium 8.4 (*)    All other components within normal limits  TROPONIN I (HIGH SENSITIVITY) - Abnormal; Notable for the following components:   Troponin I (High Sensitivity) 114 (*)    All other components within normal limits  RESP PANEL BY RT-PCR (FLU A&B, COVID) ARPGX2  BASIC METABOLIC PANEL  TROPONIN  I (HIGH SENSITIVITY)   ____________________________________________  EKG  ED ECG REPORT I, Rudene Re, the attending physician, personally viewed and interpreted this ECG.  Sinus rhythm, rate of 86, right bundle branch block, prolonged QTC, no ST elevations or depressions.  Right bundle branch block is new when compared to prior from 2011 ____________________________________________  RADIOLOGY  I have personally reviewed the images performed during this visit and I agree with the Radiologist's read.   Interpretation by Radiologist:  CT Angio Chest PE W and/or Wo Contrast  Result Date: 07/07/2020 CLINICAL DATA:  High probability for pulmonary embolism. EXAM: CT ANGIOGRAPHY CHEST WITH CONTRAST TECHNIQUE: Multidetector CT imaging of the chest was performed using the standard protocol during bolus administration of intravenous contrast. Multiplanar CT image reconstructions and MIPs were obtained to evaluate the vascular anatomy. CONTRAST:  152mL OMNIPAQUE IOHEXOL 350 MG/ML SOLN COMPARISON:  None. FINDINGS: Cardiovascular: Cardiomegaly. No pericardial effusion. Extensive coronary atherosclerosis. Bulky mitral annular calcification. No pulmonary artery filling defect. Aneurysmal ascending aorta measuring up to 4 cm in diameter. Nearly nonenhanced aorta. There are a few dark lines over the aorta which is attributed to streak artifact based on reformats. There is an otherwise homogeneous density to the aorta which would not be expected for dissection or intramural hematoma. No noncontrast scout images are available. No pulmonary artery filling defect Mediastinum/Nodes: Granulomatous nodal calcifications. Lungs/Pleura: Septal thickening and small pleural effusions with generalized airway cuffing. No focal consolidation. Pulmonary microlithiasis in the dependent right lower lobe. Upper Abdomen: Granulomatous calcifications in the spleen. Atherosclerosis. Musculoskeletal: Generalized disc  degeneration with exaggerated kyphosis. Review of the MIP images confirms the above findings. IMPRESSION: 1. CHF. 2. Negative for pulmonary embolism. 3. 4 cm diameter ascending aorta. Recommend annual imaging followup by CTA or MRA. This recommendation follows 2010 ACCF/AHA/AATS/ACR/ASA/SCA/SCAI/SIR/STS/SVM Guidelines for the Diagnosis and Management of Patients with Thoracic Aortic Disease. Circulation. 2010; 121: Y650-P546 Aortic Atherosclerosis (ICD10-I70.0). Aortic aneurysm NOS (ICD10-I71.9). Electronically Signed   By: Monte Fantasia M.D.   On: 07/07/2020 04:40   DG Chest Portable 1 View  Result Date: 07/07/2020 CLINICAL DATA:  Dyspnea, hypoxia EXAM: PORTABLE CHEST 1 VIEW COMPARISON:  04/10/2010 FINDINGS: There is mild diffuse interstitial pulmonary infiltrate  with pulmonary vascular redistribution of the lung apices most in keeping with mild diffuse interstitial pulmonary edema, possibly cardiogenic in nature. No pneumothorax or pleural effusion. Cardiac size within normal limits. No acute bone abnormality. IMPRESSION: Interval development of mild diffuse interstitial pulmonary infiltrate, most in keeping with mild pulmonary edema, possibly cardiogenic in nature. Electronically Signed   By: Fidela Salisbury MD   On: 07/07/2020 01:50     ____________________________________________   PROCEDURES  Procedure(s) performed:yes .1-3 Lead EKG Interpretation Performed by: Rudene Re, MD Authorized by: Rudene Re, MD     Interpretation: non-specific     ECG rate assessment: normal     Rhythm: sinus rhythm     Ectopy: none     Conduction: abnormal     Critical Care performed: yes  CRITICAL CARE Performed by: Rudene Re  ?  Total critical care time: 40 min  Critical care time was exclusive of separately billable procedures and treating other patients.  Critical care was necessary to treat or prevent imminent or life-threatening deterioration.  Critical care was  time spent personally by me on the following activities: development of treatment plan with patient and/or surrogate as well as nursing, discussions with consultants, evaluation of patient's response to treatment, examination of patient, obtaining history from patient or surrogate, ordering and performing treatments and interventions, ordering and review of laboratory studies, ordering and review of radiographic studies, pulse oximetry and re-evaluation of patient's condition.  ____________________________________________   INITIAL IMPRESSION / ASSESSMENT AND PLAN / ED COURSE  87 y.o. female with a history of anemia, arthritis, remote cecum cancer, diabetes, hypertension, hypothyroidism who presents for evaluation of shortness of breath that started this evening.  Patient hypoxic to the mid 80s with wheezing bilaterally.  No leg edema.  No signs of trauma.  EKG showing new right bundle branch block when compared to prior from 2011.  Chest x-ray visualized by me concerning for pulmonary edema, confirmed by radiology.  With a history of remote cancer, elevated BNP, sudden onset of shortness of breath and hypoxia will send patient for CT angio to rule out a PE.  Covid and flu negative.  No leukocytosis, no fever, she denies any cough.  Troponin and chemistry panel are pending.  If CT is negative for PE we will start patient on Lasix and get her admitted for further work-up for new CHF.  Patient placed on telemetry for close monitoring of cardiorespiratory status.  Old medical records reviewed.   _________________________ 4:46 AM on 07/07/2020 ----------------------------------------- CT of the chest negative for pulmonary embolism.  Patient's troponin is elevated at 114 concerning for demand ischemia in the setting of new CHF.  Will treat with IV Lasix and discussed with the hospitalist for admission peer     _____________________________________________ Please note:  Patient was evaluated in  Emergency Department today for the symptoms described in the history of present illness. Patient was evaluated in the context of the global COVID-19 pandemic, which necessitated consideration that the patient might be at risk for infection with the SARS-CoV-2 virus that causes COVID-19. Institutional protocols and algorithms that pertain to the evaluation of patients at risk for COVID-19 are in a state of rapid change based on information released by regulatory bodies including the CDC and federal and state organizations. These policies and algorithms were followed during the patient's care in the ED.  Some ED evaluations and interventions may be delayed as a result of limited staffing during the pandemic.   [place] Controlled Substance Database was  reviewed by me. ____________________________________________   FINAL CLINICAL IMPRESSION(S) / ED DIAGNOSES   Final diagnoses:  Acute respiratory failure with hypoxia ([place])  New onset of congestive heart failure ([place])  Demand ischemia ([place])      NEW MEDICATIONS STARTED DURING THIS VISIT:  ED Discharge Orders    None       Note:  This document was prepared using Dragon voice recognition software and may include unintentional dictation errors.    Rudene Re, MD 07/07/20 [phone]

## 2020-07-07 NOTE — ED Notes (Signed)
Pt assisted to bathroom in room x2 to have bm, no success, steady gait, usually uses [place]. States twin Genevive Bi is upgrading her room to higher level of care in about 2wks

## 2020-07-07 NOTE — ED Notes (Signed)
Pt calling yelling out for the nurse. This RN at bedside. Pt states she needs to have a BM. Ambulated to the restroom with 1 person assistance.

## 2020-07-08 ENCOUNTER — Inpatient Hospital Stay
Admit: 2020-07-08 | Discharge: 2020-07-08 | Disposition: A | Discharge status: Home / Self Care | Payer: Medicare Other | Attending: Family Medicine | Admitting: Family Medicine

## 2020-07-08 LAB — CBC
HCT: 36.3 % (ref 36.0–46.0)
Hemoglobin: 10.8 g/dL — ABNORMAL LOW (ref 12.0–15.0)
MCH: 26.3 pg (ref 26.0–34.0)
MCHC: 29.8 g/dL — ABNORMAL LOW (ref 30.0–36.0)
MCV: 88.5 fL (ref 80.0–100.0)
Platelets: 143 10*3/uL — ABNORMAL LOW (ref 150–400)
RBC: 4.1 MIL/uL (ref 3.87–5.11)
RDW: 14.6 % (ref 11.5–15.5)
WBC: 7.3 10*3/uL (ref 4.0–10.5)
nRBC: 0 % (ref 0.0–0.2)

## 2020-07-08 LAB — BASIC METABOLIC PANEL
Anion gap: 11 (ref 5–15)
BUN: 16 mg/dL (ref 8–23)
CO2: 32 mmol/L (ref 22–32)
Calcium: 8.6 mg/dL — ABNORMAL LOW (ref 8.9–10.3)
Chloride: 97 mmol/L — ABNORMAL LOW (ref 98–111)
Creatinine, Ser: 1.21 mg/dL — ABNORMAL HIGH (ref 0.44–1.00)
GFR, Estimated: 44 mL/min — ABNORMAL LOW (ref >60)
Glucose, Bld: 154 mg/dL — ABNORMAL HIGH (ref 70–99)
Potassium: 3.4 mmol/L — ABNORMAL LOW (ref 3.5–5.1)
Sodium: 140 mmol/L (ref 135–145)

## 2020-07-08 LAB — GLUCOSE, CAPILLARY
Glucose-Capillary: 248 mg/dL — ABNORMAL HIGH (ref 70–99)
Glucose-Capillary: 139 mg/dL — ABNORMAL HIGH (ref 70–99)
Glucose-Capillary: 151 mg/dL — ABNORMAL HIGH (ref 70–99)
Glucose-Capillary: 149 mg/dL — ABNORMAL HIGH (ref 70–99)

## 2020-07-08 MED ORDER — LATANOPROST 0.005 % OP SOLN
1.0000 [drp] | Freq: Every evening | OPHTHALMIC | Status: DC
  Filled 2020-07-08: qty 2.5

## 2020-07-08 MED ORDER — INSULIN GLARGINE 100 UNIT/ML ~~LOC~~ SOLN
20.0000 [IU] | Freq: Every day | SUBCUTANEOUS | Status: DC
  Filled 2020-07-08 (×2): qty 0.2

## 2020-07-08 MED ORDER — FUROSEMIDE 20 MG PO TABS
40.0000 mg | ORAL_TABLET | Freq: Every day | ORAL | Status: DC
  Administered 2020-07-08 – 2020-07-09 (×2): 40 mg via ORAL
  Filled 2020-07-08: qty 2
  Filled 2020-07-08: qty 1

## 2020-07-08 MED ORDER — MAGNESIUM OXIDE 400 (241.3 MG) MG PO TABS
400.0000 mg | ORAL_TABLET | Freq: Every day | ORAL | Status: DC
  Administered 2020-07-08 – 2020-07-09 (×2): 400 mg via ORAL
  Filled 2020-07-08 (×2): qty 1

## 2020-07-08 MED ORDER — OXYBUTYNIN CHLORIDE 5 MG PO TABS
5.0000 mg | ORAL_TABLET | Freq: Two times a day (BID) | ORAL | Status: DC
  Administered 2020-07-08: 5 mg via ORAL
  Filled 2020-07-08 (×4): qty 1

## 2020-07-08 MED ORDER — CALCIUM CARBONATE ANTACID 500 MG PO CHEW
1.0000 | CHEWABLE_TABLET | ORAL | Status: DC | PRN
  Filled 2020-07-08: qty 1

## 2020-07-08 MED ORDER — ENOXAPARIN SODIUM 30 MG/0.3ML ~~LOC~~ SOLN
30.0000 mg | SUBCUTANEOUS | Status: DC
  Administered 2020-07-09: 30 mg via SUBCUTANEOUS
  Filled 2020-07-08: qty 0.3

## 2020-07-08 MED ORDER — POTASSIUM CHLORIDE 20 MEQ PO PACK
40.0000 meq | PACK | Freq: Once | ORAL | Status: AC
  Administered 2020-07-08: 40 meq via ORAL
  Filled 2020-07-08: qty 2

## 2020-07-08 NOTE — Evaluation (Signed)
Physical Therapy Evaluation Patient Details Name: Andrea Lawson MRN: [id number] DOB: 1931/12/03 Today's Date: 07/08/2020   History of Present Illness  presented to ER status post fall in home environment, acute worsening of dyspnea, anxiety post fall; admitted for management of acute, new onset CHF wiht subsequent hypoxic respiratory failure.  Clinical Impression  Patient resting in bed upon arrival to room; alert and oriented to basic information.  Follows commands, but noted with generally flat, depressed and somewhat disengaged affect; mildly anxious/paranoid over interactions with therapist.  Generally weak and deconditioned; endorses chronic pain in back (FACES 4/10), but declines all offered interventions (meds per RN, repositioning, OOB to chair, therex).  Currently requiring cga assist for bed mobility; min assist for sit/stand, basic transfers and gait (68') with RW.  Demonstrates 3-point, step to gait pattern, mild steppage pattern L LE; slow and deliberate, but no buckling or LOB.  Declined additional distance or out of room activity. Fatigues quickly and requires consistent +1, hands-on assist for optimal safety/stability at this time. Would benefit from skilled PT to address above deficits and promote optimal return to PLOF.; recommend transition to STR upon discharge from acute hospitalization.     Follow Up Recommendations SNF    Equipment Recommendations       Recommendations for Other Services       Precautions / Restrictions Precautions Precautions: Fall Restrictions Weight Bearing Restrictions: No      Mobility  Bed Mobility Overal bed mobility: Needs Assistance Bed Mobility: Supine to Sit     Supine to sit: Min guard     General bed mobility comments: increased time/effort required; generally hesitant/resistant to therapist (offer for) assist    Transfers Overall transfer level: Needs assistance Equipment used: Rolling [place] (2 wheeled) Transfers: Sit  to/from Stand Sit to Stand: Min assist         General transfer comment: stabilizes self using posterior surface of bilat LES against edge of bed; increased use of UEs for lift off, anterior weight translation  Ambulation/Gait Ambulation/Gait assistance: Min guard;Min assist Gait Distance (Feet): 40 Feet Assistive device: Rolling Radu (2 wheeled)       General Gait Details: 3-point, step to gait pattern, mild steppage pattern L LE; slow and deliberate, but no buckling or LOB.  Declined additional distance or out of room activity  Stairs            Wheelchair Mobility    Modified Rankin (Stroke Patients Only)       Balance Overall balance assessment: Needs assistance Sitting-balance support: No upper extremity supported;Feet supported Sitting balance-Leahy Scale: Good     Standing balance support: Bilateral upper extremity supported Standing balance-Leahy Scale: Fair                               Pertinent Vitals/Pain Pain Assessment: Faces Faces Pain Scale: Hurts little more Pain Location: back Pain Descriptors / Indicators: Sore Pain Intervention(s): Limited activity within patient's tolerance;Monitored during session;Repositioned    Home Living                   Additional Comments: Resident of Indep Living portion of [facility]; single level, elevator access    Prior Function Level of Independence: Independent with assistive device(s)         Comments: Mod indep with RW for ADLs, household and community mobilization with RW; does endorse 2 falls within previous year.     Hand Dominance  Dominant Hand: Right    Extremity/Trunk Assessment   Upper Extremity Assessment Upper Extremity Assessment: Overall WFL for tasks assessed    Lower Extremity Assessment Lower Extremity Assessment: Generalized weakness (grossly 4-/5 throughout)       Communication   Communication: No difficulties  Cognition Arousal/Alertness:  Awake/alert Behavior During Therapy: Flat affect Overall Cognitive Status: Within Functional Limits for tasks assessed                                 General Comments: generally flat, depressed, somewhat disengaged affect; mild anxiety/paranoia with interactions during session      General Comments      Exercises     Assessment/Plan    PT Assessment Patient needs continued PT services  PT Problem List Decreased strength;Decreased activity tolerance;Decreased balance;Decreased mobility;Decreased coordination;Decreased cognition;Decreased knowledge of use of DME;Decreased safety awareness;Decreased knowledge of precautions;Cardiopulmonary status limiting activity;Pain       PT Treatment Interventions DME instruction;Gait training;Functional mobility training;Therapeutic activities;Therapeutic exercise;Balance training;Patient/family education    PT Goals (Current goals can be found in the Care Plan section)  Acute Rehab PT Goals Patient Stated Goal: to get to rehab with my husband so i can get stronger PT Goal Formulation: With patient Time For Goal Achievement: 07/22/20 Potential to Achieve Goals: Good    Frequency Min 2X/week   Barriers to discharge Decreased caregiver support      Co-evaluation               AM-PAC PT "6 Clicks" Mobility  Outcome Measure Help needed turning from your back to your side while in a flat bed without using bedrails?: None Help needed moving from lying on your back to sitting on the side of a flat bed without using bedrails?: A Little Help needed moving to and from a bed to a chair (including a wheelchair)?: A Little Help needed standing up from a chair using your arms (e.g., wheelchair or bedside chair)?: A Little Help needed to walk in hospital room?: A Little Help needed climbing 3-5 steps with a railing? : A Little 6 Click Score: 19    End of Session Equipment Utilized During Treatment: Gait belt Activity  Tolerance: Patient tolerated treatment well Patient left: in bed;with call bell/phone within reach;with bed alarm set Nurse Communication: Mobility status PT Visit Diagnosis: Muscle weakness (generalized) (M62.81);Difficulty in walking, not elsewhere classified (R26.2);History of falling (Z91.81)    Time: 1047-1100 PT Time Calculation (min) (ACUTE ONLY): 13 min   Charges:   PT Evaluation $PT Eval Moderate Complexity: 1 Mod          Eniola Cerullo H. Owens Shark, PT, DPT, NCS 07/08/20, 11:10 AM [phone]

## 2020-07-08 NOTE — TOC Initial Note (Addendum)
Transition of Care [facility]) - Initial/Assessment Note    Patient Details  Name: Andrea Lawson MRN: [id number] Date of Birth: [birth year]  Transition of Care [facility]) CM/SW Contact:    Kerin Salen, RN Phone Number: 07/08/2020, 2:48 PM  Clinical Narrative: TOC assessment done by phone call with Lisabeth Pick, daughter. Daughter states Mom and Dad were residents at [facility] facility. Dad became ill and now in [facility], which caused Mom to be stressed which lead to this hospitalization. Daughter states she discussed [facility] SNF with mom today and I can proceed with paperwork. [place] spoke with Admissions Coordinator Seth Bake, [phone] who prefers for patient to be discharged tomorrow to meet requirement for Insurance coverage for Rehab. Attending and Daughter notified and agree to patient being discharged tomorrow              Expected Discharge Plan: Skilled Nursing Facility Barriers to Discharge: Continued Medical Work up   Patient Goals and CMS Choice Patient states their goals for this hospitalization and ongoing recovery are:: To [facility] per daughter Arty Baumgartner   Choice offered to / list presented to : NA  Expected Discharge Plan and Services Expected Discharge Plan: [employer] In-house Referral: Clinical Social Work   Post Acute Care Choice: [place] Living arrangements for the past 2 months: [place]                 DME Arranged: N/A DME Agency: NA       HH Arranged: NA [place] Agency: NA        Prior Living Arrangements/Services Living arrangements for the past 2 months: [place] Lives with:: Self          Need for Family Participation in Patient Care: Yes (Comment) Care giver support system in place?: Yes (comment)   Criminal Activity/Legal Involvement Pertinent to Current Situation/Hospitalization: No - Comment as needed  Activities of Daily Living Home Assistive  Devices/Equipment: Environmental consultant (specify type) ADL Screening (condition at time of admission) Patient's cognitive ability adequate to safely complete daily activities?: Yes Is the patient deaf or have difficulty hearing?: No Does the patient have difficulty seeing, even when wearing glasses/contacts?: No Does the patient have difficulty concentrating, remembering, or making decisions?: No Patient able to express need for assistance with ADLs?: Yes Does the patient have difficulty dressing or bathing?: No Independently performs ADLs?: Yes (appropriate for developmental age) Does the patient have difficulty walking or climbing stairs?: Yes Weakness of Legs: Both Weakness of Arms/Hands: None  Permission Sought/Granted                  Emotional Assessment   Attitude/Demeanor/Rapport: Unable to Assess Affect (typically observed): Unable to Assess   Alcohol / Substance Use: Not Applicable Psych Involvement: No (comment)  Admission diagnosis:  Demand ischemia (HCC) [I24.8] Acute CHF (congestive heart failure) (HCC) [I50.9] Acute respiratory failure with hypoxia ([place]) [J96.01] New onset of congestive heart failure ([place]) [I50.9] Patient Active Problem List   Diagnosis Date Noted  . New onset of congestive heart failure ([place]) 07/07/2020  . Acute respiratory failure with hypoxia ([place])   . Demand ischemia ([place])   . Fall at home, initial encounter    PCP:  Adin Hector, MD Pharmacy:   [facility] [address] [place] [address] Phone: [phone] Fax: [phone]     Social Determinants of  Health (SDOH) Interventions    Readmission Risk Interventions No flowsheet data found.

## 2020-07-08 NOTE — Progress Notes (Signed)
[place] [facility] [place] NAME: Andrea Lawson    MR#:  [id number]  DATE OF BIRTH:  [birth year]  SUBJECTIVE:  came in from [facility] independent living after she slid off and landed upon the floor. No trauma. She crawled and called staff to help her. She was brought to the emergency room found to be in congestive heart failure. Currently on room air oxygen sats are 94%. Denies chest pain.    Feels better sats 94% on RA Leg edema much improved, good appetite  REVIEW OF SYSTEMS:   Review of Systems  Constitutional: Negative for chills, fever and weight loss.  HENT: Negative for ear discharge, ear pain and nosebleeds.   Eyes: Negative for blurred vision, pain and discharge.  Respiratory: Negative for sputum production, shortness of breath, wheezing and stridor.   Cardiovascular: Negative for chest pain, palpitations, orthopnea and PND.  Gastrointestinal: Negative for abdominal pain, diarrhea, nausea and vomiting.  Genitourinary: Negative for frequency and urgency.  Musculoskeletal: Negative for back pain and joint pain.  Neurological: Positive for weakness. Negative for sensory change, speech change and focal weakness.  Psychiatric/Behavioral: Negative for depression and hallucinations. The patient is not nervous/anxious.    Tolerating Diet:yes Tolerating PT: pending  DRUG ALLERGIES:   Allergies  Allergen Reactions  . Simvastatin     Other reaction(s): Other (See Comments) Other Reaction: Myositis  . Ace Inhibitors     Other reaction(s): Cough  . Angiotensin Receptor Blockers Cough    Other reaction(s): Cough  . Losartan     Other reaction(s): Unknown  . Penicillins Rash    Other reaction(s): Other (See Comments) Other Reaction: ALLERGY     VITALS:  Blood pressure 131/74, pulse 67, temperature 98.1 F (36.7 C), temperature source Oral, resp. rate 18, height 5' (1.524 m), weight 56.5 kg, SpO2 98 %.  PHYSICAL EXAMINATION:   Physical  Exam  GENERAL:  87 y.o.-year-old patient lying in the bed with no acute distress.  LUNGS: decreased breath sounds bilaterally, no wheezing,few bibasilar rales, rhonchi. No use of accessory muscles of respiration.  CARDIOVASCULAR: S1, S2 normal. No murmurs, rubs, or gallops.  ABDOMEN: Soft, nontender, nondistended. Bowel sounds present. No organomegaly or mass.  EXTREMITIES: No cyanosis, clubbing  trace edema b/l.    NEUROLOGIC: non focal, weak, deconditoned PSYCHIATRIC:  patient is alert and oriented x 3.  SKIN: No obvious rash, lesion, or ulcer.   LABORATORY PANEL:  CBC Recent Labs  Lab 07/08/20 0410  WBC 7.3  HGB 10.8*  HCT 36.3  PLT 143*    Chemistries  Recent Labs  Lab 07/07/20 0448 07/08/20 0410  NA  --  140  K  --  3.4*  CL  --  97*  CO2  --  32  GLUCOSE  --  154*  BUN  --  16  CREATININE  --  1.21*  CALCIUM  --  8.6*  MG 1.7  --    Cardiac Enzymes No results for input(s): TROPONINI in the last 168 hours. RADIOLOGY:  CT Angio Chest PE W and/or Wo Contrast  Result Date: 07/07/2020 CLINICAL DATA:  High probability for pulmonary embolism. EXAM: CT ANGIOGRAPHY CHEST WITH CONTRAST TECHNIQUE: Multidetector CT imaging of the chest was performed using the standard protocol during bolus administration of intravenous contrast. Multiplanar CT image reconstructions and MIPs were obtained to evaluate the vascular anatomy. CONTRAST:  141mL OMNIPAQUE IOHEXOL 350 MG/ML SOLN COMPARISON:  None. FINDINGS: Cardiovascular: Cardiomegaly. No pericardial effusion. Extensive  coronary atherosclerosis. Bulky mitral annular calcification. No pulmonary artery filling defect. Aneurysmal ascending aorta measuring up to 4 cm in diameter. Nearly nonenhanced aorta. There are a few dark lines over the aorta which is attributed to streak artifact based on reformats. There is an otherwise homogeneous density to the aorta which would not be expected for dissection or intramural hematoma. No noncontrast  scout images are available. No pulmonary artery filling defect Mediastinum/Nodes: Granulomatous nodal calcifications. Lungs/Pleura: Septal thickening and small pleural effusions with generalized airway cuffing. No focal consolidation. Pulmonary microlithiasis in the dependent right lower lobe. Upper Abdomen: Granulomatous calcifications in the spleen. Atherosclerosis. Musculoskeletal: Generalized disc degeneration with exaggerated kyphosis. Review of the MIP images confirms the above findings. IMPRESSION: 1. CHF. 2. Negative for pulmonary embolism. 3. 4 cm diameter ascending aorta. Recommend annual imaging followup by CTA or MRA. This recommendation follows 2010 ACCF/AHA/AATS/ACR/ASA/SCA/SCAI/SIR/STS/SVM Guidelines for the Diagnosis and Management of Patients with Thoracic Aortic Disease. Circulation. 2010; 121: F573-U202 Aortic Atherosclerosis (ICD10-I70.0). Aortic aneurysm NOS (ICD10-I71.9). Electronically Signed   By: Monte Fantasia M.D.   On: 07/07/2020 04:40   DG Chest Portable 1 View  Result Date: 07/07/2020 CLINICAL DATA:  Dyspnea, hypoxia EXAM: PORTABLE CHEST 1 VIEW COMPARISON:  04/10/2010 FINDINGS: There is mild diffuse interstitial pulmonary infiltrate with pulmonary vascular redistribution of the lung apices most in keeping with mild diffuse interstitial pulmonary edema, possibly cardiogenic in nature. No pneumothorax or pleural effusion. Cardiac size within normal limits. No acute bone abnormality. IMPRESSION: Interval development of mild diffuse interstitial pulmonary infiltrate, most in keeping with mild pulmonary edema, possibly cardiogenic in nature. Electronically Signed   By: Fidela Salisbury MD   On: 07/07/2020 01:50   ASSESSMENT AND PLAN:  Andrea Lawson is a 87 y.o. Caucasian female with medical history significant for multiple problems that are mentioned below, including type 2 diabetes mellitus and hypertension, who presented to the emergency room with acute onset of dyspnea with  associated orthopnea.  The patient apparently lost her balance and fell on her way to the bathroom without injuries.  Acute new onset CHF, likely diastolic with subsequent acute hypoxic respiratory failure. - continue diuresis with IV Lasix. Patient currently sats are 94-97% on room air. -- BNP 480 -- chest x-ray shows pulmonary vascular congestion -mild elevated troponin suspect demand ischemia. Patient denies chest pain -Obtain 2D echo  -- cardiology consult with Dr. Nehemiah Massed appreciated--recommends lasix 40 mg qd and BB. Ace as out pt   Essential hypertension. - continue her Coreg.  Type 2 diabetes mellitus. - on supplemental coverage with NovoLog and continue basal coverage. -hold off Metformin.   GERD. -continue PPI therapy.    Overactive bladder. -continue Ditropan.  Generalized deconditioning with fall -- PT OT to see patient -- TOC for discharge planning. Patient currently is at independent living with [facility]. She is going to be moved to the higher level of care along with her husband according to patient. Will have TOC look into it.  DVT prophylaxis: Lovenox. Code Status: full code. Family Communication: [name] dter on phone  Disposition Plan: Back to previous home environment Consults called: Cardiology consult. Level of care: Progressive Cardiac Status is: Inpatient  Remains inpatient appropriate because:Inpatient level of care appropriate due to severity of illness   Dispo: The patient is from: Home              Anticipated d/c is to: SNF              Patient currently is  not medically stable to d/c.   Difficult to place patient No        TOTAL TIME TAKING CARE OF THIS PATIENT: 25 minutes.  >50% time spent on counselling and coordination of care  Note: This dictation was prepared with Dragon dictation along with smaller phrase technology. Any transcriptional errors that result from this process are unintentional.  Fritzi Mandes M.D    [facility]   CC: Primary care physician; Adin Hector, MDPatient ID: Andrea Lawson, female   DOB: [birth year], 87 y.o.   MRN: [id number]

## 2020-07-08 NOTE — Progress Notes (Signed)
[facility] Encounter Note  Patient: Andrea Lawson / Admit Date: 07/07/2020 / Date of Encounter: 07/08/2020, 8:40 AM   Subjective: Patient overall slightly improved since yesterday with no evidence of significant concerns of chest pain and/or myocardial infarction or acute coronary syndrome.  Patient's pulmonary edema have improved with her lower extremity edema improvements as well.  Patient now has a slightly some acute kidney injury with a glomerular filtration rate of 44 which may need further decrease in medication management.  If patient feels well with current appropriate medication management including the carvedilol diuretic treatment would potentially be okay for discharged home  Review of Systems: Positive for: Shortness of breath Negative for: Vision change, hearing change, syncope, dizziness, nausea, vomiting,diarrhea, bloody stool, stomach pain, cough, congestion, diaphoresis, urinary frequency, urinary pain,skin lesions, skin rashes Others previously listed  Objective: Telemetry: Normal sinus rhythm Physical Exam: Blood pressure 131/74, pulse 67, temperature 98.1 F (36.7 C), temperature source Oral, resp. rate 18, height 5' (1.524 m), weight 56.5 kg, SpO2 98 %. Body mass index is 24.33 kg/m. General: Well developed, well nourished, in no acute distress. Head: Normocephalic, atraumatic, sclera non-icteric, no xanthomas, nares are without discharge. Neck: No apparent masses Lungs: Normal respirations with no wheezes, no rhonchi, no rales , few crackles   Heart: Regular rate and rhythm, normal S1 S2, no murmur, no rub, no gallop, PMI is normal size and placement, carotid upstroke normal without bruit, jugular venous pressure normal Abdomen: Soft, non-tender, non-distended with normoactive bowel sounds. No hepatosplenomegaly. Abdominal aorta is normal size without bruit Extremities: Trace edema, no clubbing, no cyanosis, no ulcers,  Peripheral: 2+ radial, 2+  femoral, 2+ dorsal pedal pulses Neuro: Alert and oriented. Moves all extremities spontaneously. Psych:  Responds to questions appropriately with a normal affect.   Intake/Output Summary (Last 24 hours) at 07/08/2020 0840 Last data filed at 07/08/2020 0600 Gross per 24 hour  Intake 50 ml  Output 550 ml  Net -500 ml    Inpatient Medications:  . aspirin EC  81 mg Oral Daily  . carvedilol  6.25 mg Oral BID WC  . cholecalciferol  1,000 Units Oral Daily  . enoxaparin (LOVENOX) injection  40 mg Subcutaneous Q24H  . furosemide  20 mg Intravenous Q12H  . insulin aspart  0-9 Units Subcutaneous TID PC & HS  . insulin glargine  15 Units Subcutaneous QHS  . multivitamin-lutein   Oral Daily  . pantoprazole  40 mg Oral Daily  . potassium chloride  40 mEq Oral Once  . vitamin B-12  1,000 mcg Oral Daily   Infusions:   Labs: Recent Labs    07/07/20 0246 07/07/20 0448 07/08/20 0410  NA 140  --  140  K 3.4*  --  3.4*  CL 104  --  97*  CO2 25  --  32  GLUCOSE 181*  --  154*  BUN 11  --  16  CREATININE 0.62  --  1.21*  CALCIUM 8.4*  --  8.6*  MG  --  1.7  --    No results for input(s): AST, ALT, ALKPHOS, BILITOT, PROT, ALBUMIN in the last 72 hours. Recent Labs    07/07/20 0133 07/08/20 0410  WBC 9.8 7.3  NEUTROABS 7.8*  --   HGB 11.1* 10.8*  HCT 37.4 36.3  MCV 88.8 88.5  PLT 148* 143*   No results for input(s): CKTOTAL, CKMB, TROPONINI in the last 72 hours. Invalid input(s): [place] Recent Labs    07/07/20 1331  HGBA1C 7.4*     Weights: Filed Weights   07/07/20 0110 07/07/20 1259 07/08/20 0530  Weight: 63.5 kg 56.3 kg 56.5 kg     Radiology/Studies:  CT Angio Chest PE W and/or Wo Contrast  Result Date: 07/07/2020 CLINICAL DATA:  High probability for pulmonary embolism. EXAM: CT ANGIOGRAPHY CHEST WITH CONTRAST TECHNIQUE: Multidetector CT imaging of the chest was performed using the standard protocol during bolus administration of intravenous contrast. Multiplanar CT  image reconstructions and MIPs were obtained to evaluate the vascular anatomy. CONTRAST:  142mL OMNIPAQUE IOHEXOL 350 MG/ML SOLN COMPARISON:  None. FINDINGS: Cardiovascular: Cardiomegaly. No pericardial effusion. Extensive coronary atherosclerosis. Bulky mitral annular calcification. No pulmonary artery filling defect. Aneurysmal ascending aorta measuring up to 4 cm in diameter. Nearly nonenhanced aorta. There are a few dark lines over the aorta which is attributed to streak artifact based on reformats. There is an otherwise homogeneous density to the aorta which would not be expected for dissection or intramural hematoma. No noncontrast scout images are available. No pulmonary artery filling defect Mediastinum/Nodes: Granulomatous nodal calcifications. Lungs/Pleura: Septal thickening and small pleural effusions with generalized airway cuffing. No focal consolidation. Pulmonary microlithiasis in the dependent right lower lobe. Upper Abdomen: Granulomatous calcifications in the spleen. Atherosclerosis. Musculoskeletal: Generalized disc degeneration with exaggerated kyphosis. Review of the MIP images confirms the above findings. IMPRESSION: 1. CHF. 2. Negative for pulmonary embolism. 3. 4 cm diameter ascending aorta. Recommend annual imaging followup by CTA or MRA. This recommendation follows 2010 ACCF/AHA/AATS/ACR/ASA/SCA/SCAI/SIR/STS/SVM Guidelines for the Diagnosis and Management of Patients with Thoracic Aortic Disease. Circulation. 2010; 121: Q683-M196 Aortic Atherosclerosis (ICD10-I70.0). Aortic aneurysm NOS (ICD10-I71.9). Electronically Signed   By: Monte Fantasia M.D.   On: 07/07/2020 04:40   DG Chest Portable 1 View  Result Date: 07/07/2020 CLINICAL DATA:  Dyspnea, hypoxia EXAM: PORTABLE CHEST 1 VIEW COMPARISON:  04/10/2010 FINDINGS: There is mild diffuse interstitial pulmonary infiltrate with pulmonary vascular redistribution of the lung apices most in keeping with mild diffuse interstitial pulmonary  edema, possibly cardiogenic in nature. No pneumothorax or pleural effusion. Cardiac size within normal limits. No acute bone abnormality. IMPRESSION: Interval development of mild diffuse interstitial pulmonary infiltrate, most in keeping with mild pulmonary edema, possibly cardiogenic in nature. Electronically Signed   By: Fidela Salisbury MD   On: 07/07/2020 01:50     Assessment and Recommendation  87 y.o. female with known diabetes hypertension hyperlipidemia abnormal EKG with acute systolic dysfunction congestive heart failure with elevated troponin consistent with demand ischemia rather than acute coronary syndrome and pulmonary edema by chest x-ray clinically improved at this time with slightly worsening chronic kidney disease due to diuresis 1.  Continue intravenous diuresis this a.m. and switch to oral furosemide at 40 mg each day for discharge to home 2.  Continue carvedilol for hypertension and congestive heart failure treatment and would consider outpatient angiotensin receptor blocker when able 3.  No further cardiac diagnostics necessary at this time due to significant improvements of symptoms with mental diuresis and no current evidence of acute coronary syndrome 4.  If patient is ambulating well with no further significant symptoms and improved as for above would be okay for discharged home with follow-up next week with further adjustments of medications  Signed, Serafina Royals M.D. FACC

## 2020-07-08 NOTE — NC FL2 (Signed)
[place] LEVEL OF CARE SCREENING TOOL     IDENTIFICATION  Patient Name: Andrea Lawson Birthdate: 1931/08/16 Sex: female Admission Date (Current Location): 07/07/2020  [place] and [place] Number:  Engineering geologist and Address:  [facility], [address] [place], [place] [address]      Provider Number: [id number]  Attending Physician Name and Address:  Fritzi Mandes, MD  Relative Name and Phone Number:  Lisabeth Pick [phone]    Current Level of Care: Hospital Recommended Level of Care: [place] Prior Approval Number:    Date Approved/Denied:   PASRR Number: [id number]  Discharge Plan: SNF    Current Diagnoses: Patient Active Problem List   Diagnosis Date Noted  . New onset of congestive heart failure ([place]) 07/07/2020  . Acute respiratory failure with hypoxia ([place])   . Demand ischemia ([place])   . Fall at home, initial encounter     Orientation RESPIRATION BLADDER Height & Weight     Self,Time,Place,Situation  Normal Continent Weight: 56.5 kg Height:  5' (152.4 cm)  BEHAVIORAL SYMPTOMS/MOOD NEUROLOGICAL BOWEL NUTRITION STATUS      Continent Diet  AMBULATORY STATUS COMMUNICATION OF NEEDS Skin   Limited Assist Verbally Other (Comment) (left foot ulcer)                       Personal Care Assistance Level of Assistance  Bathing,Feeding,Dressing Bathing Assistance: Limited assistance Feeding assistance: Independent Dressing Assistance: Limited assistance     Functional Limitations Info  Sight,Hearing,Speech Sight Info: Adequate Hearing Info: Adequate Speech Info: Adequate    SPECIAL CARE FACTORS FREQUENCY  PT (By licensed PT),OT (By licensed OT)     PT Frequency: 5x week OT Frequency: 5x week            Contractures Contractures Info: Not present    Additional Factors Info  Code Status,Allergies Code Status Info: DNR Allergies Info: Simvastatin, Ace Inhibitors, Losartan,  Penicillins, Angiotensin           Current Medications (07/08/2020):  This is the current hospital active medication list Current Facility-Administered Medications  Medication Dose Route Frequency Provider Last Rate Last Admin  . acetaminophen (TYLENOL) tablet 650 mg  650 mg Oral Q6H PRN Mansy, Jan A, MD       Or  . acetaminophen (TYLENOL) suppository 650 mg  650 mg Rectal Q6H PRN Mansy, Jan A, MD      . aspirin EC tablet 81 mg  81 mg Oral Daily Mansy, Jan A, MD   81 mg at 07/08/20 0957  . calcium carbonate (TUMS - dosed in mg elemental calcium) chewable tablet 200 mg of elemental calcium  1 tablet Oral Q2H PRN Fritzi Mandes, MD      . carvedilol (COREG) tablet 6.25 mg  6.25 mg Oral BID WC Mansy, Jan A, MD   6.25 mg at 07/08/20 0957  . cholecalciferol (VITAMIN D3) tablet 1,000 Units  1,000 Units Oral Daily Mansy, Arvella Merles, MD   1,000 Units at 07/08/20 0957  . [START ON 07/09/2020] enoxaparin (LOVENOX) injection 30 mg  30 mg Subcutaneous Q24H Eleonore Chiquito S, RPH      . furosemide (LASIX) tablet 40 mg  40 mg Oral Daily Fritzi Mandes, MD   40 mg at 07/08/20 1145  . insulin aspart (novoLOG) injection 0-9 Units  0-9 Units Subcutaneous TID PC & HS Mansy, Arvella Merles, MD   1 Units at 07/08/20 1225  . insulin glargine (LANTUS) injection  20 Units  20 Units Subcutaneous QHS Fritzi Mandes, MD      . latanoprost (XALATAN) 0.005 % ophthalmic solution 1 drop  1 drop Both Eyes QPM Fritzi Mandes, MD      . magnesium hydroxide (MILK OF MAGNESIA) suspension 30 mL  30 mL Oral Daily PRN Mansy, Jan A, MD      . magnesium oxide (MAG-OX) tablet 400 mg  400 mg Oral Daily Fritzi Mandes, MD   400 mg at 07/08/20 1145  . multivitamin-lutein (OCUVITE-LUTEIN) capsule   Oral Daily Mansy, Jan A, MD   1 capsule at 07/08/20 0957  . ondansetron (ZOFRAN) tablet 4 mg  4 mg Oral Q6H PRN Mansy, Jan A, MD       Or  . ondansetron [facility]) injection 4 mg  4 mg Intravenous Q6H PRN Mansy, Jan A, MD      . oxybutynin (DITROPAN) tablet 5 mg  5 mg Oral  BID Fritzi Mandes, MD   5 mg at 07/08/20 1145  . pantoprazole (PROTONIX) EC tablet 40 mg  40 mg Oral Daily Mansy, Jan A, MD   40 mg at 07/08/20 0957  . traZODone (DESYREL) tablet 25 mg  25 mg Oral QHS PRN Mansy, Jan A, MD      . vitamin B-12 (CYANOCOBALAMIN) tablet 1,000 mcg  1,000 mcg Oral Daily Mansy, Jan A, MD   1,000 mcg at 07/08/20 1779     Discharge Medications: Please see discharge summary for a list of discharge medications.  Relevant Imaging Results:  Relevant Lab Results:   Additional Information SS# [id number]  Kerin Salen, RN

## 2020-07-08 NOTE — Progress Notes (Signed)
Mobility Specialist - Progress Note   07/08/20 1500  Mobility  Activity Ambulated in room;Transferred to/from BSC  Level of Assistance Minimal assist, patient does 75% or more  Assistive Device Front wheel [place]  Distance Ambulated (ft) 25 ft  Mobility Response Tolerated well  Mobility performed by Mobility specialist  $Mobility charge 1 Mobility    Pre-mobility: 68 HR, 95% SpO2 During mobility: 80 HR, 93% SpO2 Post-mobility: 66 HR, 93% SpO2   Pt sat EOB with minA, transferred to [facility] for urinal output. ModI for peri-care. Pt continued ambulation in room with RW and supervision. Did c/o dizziness during ambulation.    Kathee Delton Mobility Specialist 07/08/20, 3:07 PM

## 2020-07-09 DIAGNOSIS — Y92009 Unspecified place in unspecified non-institutional (private) residence as the place of occurrence of the external cause: Secondary | ICD-10-CM

## 2020-07-09 DIAGNOSIS — J9601 Acute respiratory failure with hypoxia: Secondary | ICD-10-CM

## 2020-07-09 DIAGNOSIS — I509 Heart failure, unspecified: Secondary | ICD-10-CM

## 2020-07-09 DIAGNOSIS — W19XXXA Unspecified fall, initial encounter: Secondary | ICD-10-CM

## 2020-07-09 DIAGNOSIS — I248 Other forms of acute ischemic heart disease: Secondary | ICD-10-CM

## 2020-07-09 LAB — ECHOCARDIOGRAM COMPLETE
AR max vel: 1.09 cm2
AV Area VTI: 1.15 cm2
AV Area mean vel: 1.04 cm2
AV Mean grad: 9.5 mmHg
AV Peak grad: 14.6 mmHg
Ao pk vel: 1.91 m/s
Area-P 1/2: 2.13 cm2
Height: 60 in
S' Lateral: 2.38 cm
Weight: 1993.6 oz

## 2020-07-09 LAB — GLUCOSE, CAPILLARY: Glucose-Capillary: 245 mg/dL — ABNORMAL HIGH (ref 70–99)

## 2020-07-09 MED ORDER — FUROSEMIDE 20 MG PO TABS: 20.0000 mg | ORAL_TABLET | Freq: Every day | ORAL | 0 refills | Status: DC

## 2020-07-09 NOTE — Discharge Summary (Signed)
[facility] [place] NAME: Andrea Lawson    MR#:  [id number]  DATE OF BIRTH:  27-Aug-1931  DATE OF ADMISSION:  07/07/2020   ADMITTING PHYSICIAN: Christel Mormon, MD  DATE OF DISCHARGE: 07/09/2020  PRIMARY CARE PHYSICIAN: Adin Hector, MD   ADMISSION DIAGNOSIS:  Demand ischemia ([place]) [I24.8] Acute CHF (congestive heart failure) ([place]) [I50.9] Acute respiratory failure with hypoxia ([place]) [J96.01] New onset of congestive heart failure ([place]) [I50.9] DISCHARGE DIAGNOSIS:  Active Problems:   New onset of congestive heart failure ([place])   Acute respiratory failure with hypoxia ([place])   Demand ischemia ([place])   Fall at home, initial encounter  SECONDARY DIAGNOSIS:   Past Medical History:  Diagnosis Date  . Allergic state   . Anemia    IRON DEF.ANEMIA  . Arthritis    OSTEOARTHRITIS OF BIL.KNEES  . B12 deficiency   . Basal cell carcinoma   . Callus of foot    bil.  . 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 Cataract cortical, senile    RIGHT EYE  . Cholesterol-lowering agent myopathy 2004  . Diabetes mellitus without complication ([place])   . Diabetic neuropathy ([place])   . Diverticulosis   . Drug-induced myopathy   . Eczema   . GERD (gastroesophageal reflux disease)   . Gout   . Hemorrhoids   . History of chicken pox   . History of hypercalcemia   . Hypercholesterolemia   . Hyperparathyroidism ([place])   . Hypertension   . Hypothyroidism   . Incontinent of urine   . Lumbago   . Mycotic toenails   . Neuropathy due to secondary diabetes mellitus [facility])    HOSPITAL COURSE:  Andrea Lawson a 87 y.o.Caucasian femalewith medical history significant formultiple problems that are mentioned below, including type 2 diabetes mellitus and hypertensionadmitted for acute onset of dyspnea with associated orthopnea. The patient apparently lost her balance and fellon her way to the bathroom without injuries.  Acute new onset CHF, likely diastolicwith subsequent acute  hypoxic respiratory failure. - continue diuresis with IV Lasix. Patient currently sats are 94-97% on room air. -- BNP 480 on admission -- chest x-ray shows pulmonary vascular congestion -mild elevated troponin suspect demand ischemia. Patient denies chest pain -pending 2D echo read at D/C -- cardiology Dr. Nehemiah Massed seen, outpt f/up. Will cut back on lasix to 20 mg once daily at D/C considering her symptoms for dizziness - can adjust the dose as an outpt if need per cardio - outpt f/up at CHF clinic and cardio  Essential hypertension. - continue Coreg. If persistent dizziness - consider reducing the dose to once daily  Type 2 diabetes mellitus. - resume home regimen at D/C  Overactive bladder. -continue Ditropan.  Generalized deconditioning with fall -- PT OT recommends SNF -- daughter in agreement with sending her to [facility] SNF per [facility]    DISCHARGE CONDITIONS:  stable CONSULTS OBTAINED:   DRUG ALLERGIES:   Allergies  Allergen Reactions  . Simvastatin     Other reaction(s): Other (See Comments) Other Reaction: Myositis  . Ace Inhibitors     Other reaction(s): Cough  . Angiotensin Receptor Blockers Cough    Other reaction(s): Cough  . Losartan     Other reaction(s): Unknown  . Penicillins Rash    Other reaction(s): Other (See Comments) Other Reaction: ALLERGY    DISCHARGE MEDICATIONS:   Allergies as of 07/09/2020      Reactions   Simvastatin  Other reaction(s): Other (See Comments) Other Reaction: Myositis   Ace Inhibitors    Other reaction(s): Cough   Angiotensin Receptor Blockers Cough   Other reaction(s): Cough   Losartan    Other reaction(s): Unknown   Penicillins Rash   Other reaction(s): Other (See Comments) Other Reaction: ALLERGY      Medication List    STOP taking these medications   ANTIOXIDANT PO   omeprazole 20 MG capsule Commonly known as: PRILOSEC     TAKE these medications   calcium carbonate 750 MG chewable  tablet Commonly known as: TUMS EX Chew 1 tablet by mouth every 2 (two) hours as needed for heartburn.   carvedilol 6.25 MG tablet Commonly known as: COREG Take 6.25 mg by mouth 2 (two) times daily with a meal.   cholecalciferol 10 MCG (400 UNIT) Tabs tablet Commonly known as: VITAMIN D3 Take 1,000 Units by mouth.   furosemide 20 MG tablet Commonly known as: LASIX Take 1 tablet (20 mg total) by mouth daily.   HEALTHY COLON PO Take 1 capsule by mouth daily.   insulin degludec 100 UNIT/ML FlexTouch Pen Commonly known as: TRESIBA Inject 15 Units into the skin daily at 10 pm.   lactase-rennet 25-2 MG tablet Take 1 tablet by mouth daily as needed.   latanoprost 0.005 % ophthalmic solution Commonly known as: XALATAN Place 1 drop into both eyes every evening.   loperamide 2 MG capsule Commonly known as: IMODIUM Take by mouth as needed for diarrhea or loose stools.   magnesium oxide 400 MG tablet Commonly known as: MAG-OX Take 400 mg by mouth daily.   metFORMIN 500 MG tablet Commonly known as: GLUCOPHAGE Take 500 mg by mouth 2 (two) times daily with a meal.   oxybutynin 5 MG tablet Commonly known as: DITROPAN Take 5 mg by mouth 2 (two) times daily.   VITAMIN B 12 PO Take 1,000 mcg by mouth daily.      DISCHARGE INSTRUCTIONS:   DIET:  Cardiac diet DISCHARGE CONDITION:  Stable ACTIVITY:  Activity as tolerated OXYGEN:  Home Oxygen: No.  Oxygen Delivery: room air DISCHARGE LOCATION:  nursing home/[facility] SNF with Palliative care to follow   If you experience worsening of your admission symptoms, develop shortness of breath, life threatening emergency, suicidal or homicidal thoughts you must seek medical attention immediately by calling 911 or calling your MD immediately  if symptoms less severe.  You Must read complete instructions/literature along with all the possible adverse reactions/side effects for all the Medicines you take and that have been prescribed  to you. Take any new Medicines after you have completely understood and accpet all the possible adverse reactions/side effects.   Please note  You were cared for by a hospitalist during your hospital stay. If you have any questions about your discharge medications or the care you received while you were in the hospital after you are discharged, you can call the unit and asked to speak with the hospitalist on call if the hospitalist that took care of you is not available. Once you are discharged, your primary care physician will handle any further medical issues. Please note that NO REFILLS for any discharge medications will be authorized once you are discharged, as it is imperative that you return to your primary care physician (or establish a relationship with a primary care physician if you do not have one) for your aftercare needs so that they can reassess your need for medications and monitor your lab values.  On the day of Discharge:  VITAL SIGNS:  Blood pressure 112/70, pulse 70, temperature 98.7 F (37.1 C), temperature source Oral, resp. rate 16, height 5' (1.524 m), weight 56.5 kg, SpO2 95 %. PHYSICAL EXAMINATION:  GENERAL:  87 y.o.-year-old patient lying in the bed with no acute distress.  EYES: Pupils equal, round, reactive to light and accommodation. No scleral icterus. Extraocular muscles intact.  HEENT: Head atraumatic, normocephalic. Oropharynx and nasopharynx clear.  NECK:  Supple, no jugular venous distention. No thyroid enlargement, no tenderness.  LUNGS: Normal breath sounds bilaterally, no wheezing, rales,rhonchi or crepitation. No use of accessory muscles of respiration.  CARDIOVASCULAR: S1, S2 normal. No murmurs, rubs, or gallops.  ABDOMEN: Soft, non-tender, non-distended. Bowel sounds present. No organomegaly or mass.  EXTREMITIES: No pedal edema, cyanosis, or clubbing.  NEUROLOGIC: Cranial nerves II through XII are intact. Muscle strength 5/5 in all extremities.  Sensation intact. Gait not checked.  PSYCHIATRIC: The patient is alert and oriented x 3.  SKIN: No obvious rash, lesion, or ulcer.  DATA REVIEW:   CBC Recent Labs  Lab 07/08/20 0410  WBC 7.3  HGB 10.8*  HCT 36.3  PLT 143*    Chemistries  Recent Labs  Lab 07/07/20 0448 07/08/20 0410  NA  --  140  K  --  3.4*  CL  --  97*  CO2  --  32  GLUCOSE  --  154*  BUN  --  16  CREATININE  --  1.21*  CALCIUM  --  8.6*  MG 1.7  --      Outpatient follow-up  Follow-up Information    Corey Skains, MD. Schedule an appointment as soon as possible for a visit in 2 week(s).   Specialty: Cardiology Contact information: [facility] [place] [address] [phone]        Tama High III, MD. Schedule an appointment as soon as possible for a visit in 1 week(s).   Specialty: Internal Medicine Contact information: [place] [address] [phone]               27 Day Unplanned Readmission Risk Score   Flowsheet Row ED to Hosp-Admission (Current) from 07/07/2020 in [facility]  30 Day Unplanned Readmission Risk Score (%) 16.19 Filed at 07/09/2020 0400     This score is the patient's risk of an unplanned readmission within 30 days of being discharged (0 -100%). The score is based on dignosis, age, lab data, medications, orders, and past utilization.   Low:  0-14.9   Medium: 15-21.9   High: 22-29.9   Extreme: 30 and above         Management plans discussed with the patient, family and they are in agreement.  CODE STATUS: DNR   TOTAL TIME TAKING CARE OF THIS PATIENT: 45 minutes.    Max Sane M.D on 07/09/2020 at 7:50 AM  [facility]   CC: Primary care physician; Adin Hector, MD   Note: This dictation was prepared with Dragon dictation along with smaller phrase technology. Any transcriptional errors that result from this process are  unintentional.

## 2020-07-09 NOTE — TOC Transition Note (Signed)
Transition of Care [facility]) - CM/SW Discharge Note   Patient Details  Name: Andrea Lawson MRN: [id number] Date of Birth: January 20, 1932  Transition of Care [facility]) CM/SW Contact:  Kerin Salen, RN Phone Number: 07/09/2020, 9:39 AM   Clinical Narrative: Patient to be discharged to [facility]. TOC barriers resolved.      Final next level of care: Skilled Nursing Facility Barriers to Discharge: Barriers Resolved   Patient Goals and CMS Choice Patient states their goals for this hospitalization and ongoing recovery are:: To discharge to SNF.   Choice offered to / list presented to : NA  Discharge Placement                Patient to be transferred to facility by: Daughter, Lisabeth Pick Name of family member notified: Quincy Simmonds Patient and family notified of of transfer: 07/08/20  Discharge Plan and Services In-house Referral: Clinical Social Work   Post Acute Care Choice: [place]          DME Arranged: N/A DME Agency: NA       HH Arranged: NA HH Agency: NA        Social Determinants of Health (SDOH) Interventions     Readmission Risk Interventions No flowsheet data found.

## 2020-07-09 NOTE — Progress Notes (Signed)
Report given to Finis Bud.

## 2020-07-09 NOTE — Consult Note (Signed)
   Heart Failure Nurse Navigator Note  HF-  She presented to the emergency room after she fell on her way to the bathroom and was anxious about falling again.  In the ED she was noted to be hypertensive with blood pressure 191/108 heart rate of 102.  Chest x-ray revealed mild diffuse interstitial pulmonary infiltrate.  Comorbidities:  Anemia Osteoarthritis Diabetes GERD Gout Hyperlipidemia Hypertension  Medications:  Aspirin 81 mg daily Coreg 6.25 mg daily with meals twice Furosemide 40 mg daily Potassium chloride once 40 mEq  She has listed allergy to ACE inhibitors and angiotension receptor blockers works results in a cough.  Labs:  Sodium 140, potassium 3.4, chloride 97, CO2 32, BUN 16, creatinine 1.21 up from 0.62 of yesterday, GFR 44 Weight is 56.5 Intake 1160 mL Output 500 mL Blood pressure 112/70 with a pulse of 70.   Assessment:  Andrea Lawson is awake and alert lying in bed watching television.  HEENT-wears glasses  Cardiac-heart tones of regular rate and rhythm with systolic murmur.  Chest-breath sounds are clear to posterior auscultation.  Abdomen-soft nontender  Musculoskeletal-there is no lower extremity edema and pedal pulses are palpable at 2+  Psych-pleasant and appropriate makes good eye contact  Neurologic-she is awake and alert speech is clear.    Met with patient just prior to discharge.  She denies any increasing shortness of breath or dizziness or lightheadedness at this time.  She is currently prior to discharge living in independent living at [facility].  But plans to move into a skilled area.  She states for right now she does her own cooking daughter buys her dinners that can be placed into the oven,she also likes to eat [employer] soups.  Discussed low-sodium diet, refraining from convenience foods and soups.  She states that she drinks less than eight 8 ounce cups of water daily.  She does not even drink coffee anymore.  Discussed  the heart failure zone magnet and what the different zones mean along with discussion about the heart failure teaching booklet.  He states it is hard for her to see the written print she needs to have her glass prescription changed.  She states that she does not weigh herself daily at home, instructed to start weighing daily and recording and to report 2 to 3 pound weight gain or 5 pounds within a week to her physician.  Discussed the upcoming appointment at the outpatient heart failure clinic which is located in the medical arts building on the second floor.  She is awaiting her daughter to come and pick her up.   Pricilla Riffle RN CHFN

## 2020-07-09 NOTE — Progress Notes (Signed)
Patient refused night time medications which included 20U of lantus, 1U of novolog, and a 5mg  oxybutynin. Patient educated and still refused. NP notified.

## 2020-07-09 NOTE — Progress Notes (Signed)
[facility] Encounter Note  Patient: Andrea Lawson / Admit Date: 07/07/2020 / Date of Encounter: 07/09/2020, 8:17 AM   Subjective: Patient rested well overnight with no evidence of significant shortness of breath chest pain or other cardiovascular concerns.  Patient has tolerated medication management for congestive heart failure and valvular heart disease with furosemide changing over to oral furosemide last night.  Carvedilol has been continued with a somewhat lower blood pressure and some concerns for addition of other medication management but can be changed at outpatient if necessary.  If ambulating well today will be able to for discharge to home  Review of Systems: Positive for: Shortness of breath Negative for: Vision change, hearing change, syncope, dizziness, nausea, vomiting,diarrhea, bloody stool, stomach pain, cough, congestion, diaphoresis, urinary frequency, urinary pain,skin lesions, skin rashes Others previously listed  Objective: Telemetry: Normal sinus rhythm Physical Exam: Blood pressure 112/70, pulse 70, temperature 98.7 F (37.1 C), temperature source Oral, resp. rate 16, height 5' (1.524 m), weight 56.5 kg, SpO2 95 %. Body mass index is 24.33 kg/m. General: Well developed, well nourished, in no acute distress. Head: Normocephalic, atraumatic, sclera non-icteric, no xanthomas, nares are without discharge. Neck: No apparent masses Lungs: Normal respirations with no wheezes, no rhonchi, no rales , few crackles   Heart: Regular rate and rhythm, normal S1 S2, no murmur, no rub, no gallop, PMI is normal size and placement, carotid upstroke normal without bruit, jugular venous pressure normal Abdomen: Soft, non-tender, non-distended with normoactive bowel sounds. No hepatosplenomegaly. Abdominal aorta is normal size without bruit Extremities: Trace edema, no clubbing, no cyanosis, no ulcers,  Peripheral: 2+ radial, 2+ femoral, 2+ dorsal pedal  pulses Neuro: Alert and oriented. Moves all extremities spontaneously. Psych:  Responds to questions appropriately with a normal affect.   Intake/Output Summary (Last 24 hours) at 07/09/2020 0817 Last data filed at 07/08/2020 2200 Gross per 24 hour  Intake 1160 ml  Output 500 ml  Net 660 ml    Inpatient Medications:  . aspirin EC  81 mg Oral Daily  . carvedilol  6.25 mg Oral BID WC  . cholecalciferol  1,000 Units Oral Daily  . enoxaparin (LOVENOX) injection  30 mg Subcutaneous Q24H  . furosemide  40 mg Oral Daily  . insulin aspart  0-9 Units Subcutaneous TID PC & HS  . insulin glargine  20 Units Subcutaneous QHS  . latanoprost  1 drop Both Eyes QPM  . magnesium oxide  400 mg Oral Daily  . multivitamin-lutein   Oral Daily  . oxybutynin  5 mg Oral BID  . pantoprazole  40 mg Oral Daily  . vitamin B-12  1,000 mcg Oral Daily   Infusions:   Labs: Recent Labs    07/07/20 0246 07/07/20 0448 07/08/20 0410  NA 140  --  140  K 3.4*  --  3.4*  CL 104  --  97*  CO2 25  --  32  GLUCOSE 181*  --  154*  BUN 11  --  16  CREATININE 0.62  --  1.21*  CALCIUM 8.4*  --  8.6*  MG  --  1.7  --    No results for input(s): AST, ALT, ALKPHOS, BILITOT, PROT, ALBUMIN in the last 72 hours. Recent Labs    07/07/20 0133 07/08/20 0410  WBC 9.8 7.3  NEUTROABS 7.8*  --   HGB 11.1* 10.8*  HCT 37.4 36.3  MCV 88.8 88.5  PLT 148* 143*   No results for input(s): CKTOTAL, CKMB,  TROPONINI in the last 72 hours. Invalid input(s): POCBNP Recent Labs    07/07/20 1331  HGBA1C 7.4*     Weights: Filed Weights   07/07/20 1259 07/08/20 0530 07/09/20 0500  Weight: 56.3 kg 56.5 kg 56.5 kg     Radiology/Studies:  CT Angio Chest PE W and/or Wo Contrast  Result Date: 07/07/2020 CLINICAL DATA:  High probability for pulmonary embolism. EXAM: CT ANGIOGRAPHY CHEST WITH CONTRAST TECHNIQUE: Multidetector CT imaging of the chest was performed using the standard protocol during bolus administration of  intravenous contrast. Multiplanar CT image reconstructions and MIPs were obtained to evaluate the vascular anatomy. CONTRAST:  144mL OMNIPAQUE IOHEXOL 350 MG/ML SOLN COMPARISON:  None. FINDINGS: Cardiovascular: Cardiomegaly. No pericardial effusion. Extensive coronary atherosclerosis. Bulky mitral annular calcification. No pulmonary artery filling defect. Aneurysmal ascending aorta measuring up to 4 cm in diameter. Nearly nonenhanced aorta. There are a few dark lines over the aorta which is attributed to streak artifact based on reformats. There is an otherwise homogeneous density to the aorta which would not be expected for dissection or intramural hematoma. No noncontrast scout images are available. No pulmonary artery filling defect Mediastinum/Nodes: Granulomatous nodal calcifications. Lungs/Pleura: Septal thickening and small pleural effusions with generalized airway cuffing. No focal consolidation. Pulmonary microlithiasis in the dependent right lower lobe. Upper Abdomen: Granulomatous calcifications in the spleen. Atherosclerosis. Musculoskeletal: Generalized disc degeneration with exaggerated kyphosis. Review of the MIP images confirms the above findings. IMPRESSION: 1. CHF. 2. Negative for pulmonary embolism. 3. 4 cm diameter ascending aorta. Recommend annual imaging followup by CTA or MRA. This recommendation follows 2010 ACCF/AHA/AATS/ACR/ASA/SCA/SCAI/SIR/STS/SVM Guidelines for the Diagnosis and Management of Patients with Thoracic Aortic Disease. Circulation. 2010; 121: N462-V035 Aortic Atherosclerosis (ICD10-I70.0). Aortic aneurysm NOS (ICD10-I71.9). Electronically Signed   By: Monte Fantasia M.D.   On: 07/07/2020 04:40   DG Chest Portable 1 View  Result Date: 07/07/2020 CLINICAL DATA:  Dyspnea, hypoxia EXAM: PORTABLE CHEST 1 VIEW COMPARISON:  04/10/2010 FINDINGS: There is mild diffuse interstitial pulmonary infiltrate with pulmonary vascular redistribution of the lung apices most in keeping with  mild diffuse interstitial pulmonary edema, possibly cardiogenic in nature. No pneumothorax or pleural effusion. Cardiac size within normal limits. No acute bone abnormality. IMPRESSION: Interval development of mild diffuse interstitial pulmonary infiltrate, most in keeping with mild pulmonary edema, possibly cardiogenic in nature. Electronically Signed   By: Fidela Salisbury MD   On: 07/07/2020 01:50     Assessment and Recommendation  87 y.o. female with known diabetes hypertension hyperlipidemia abnormal EKG with acute systolic dysfunction congestive heart failure with elevated troponin consistent with demand ischemia rather than acute coronary syndrome and pulmonary edema by chest x-ray clinically improved at this time with slightly worsening chronic kidney disease due to diuresis 1.  Continuation of oral furosemide at 40 mg each day for discharge to home for acute systolic dysfunction congestive heart failure 2.  Continue carvedilol for hypertension and congestive heart failure treatment and would consider outpatient angiotensin receptor blocker when able 3.  No further cardiac diagnostics necessary at this time due to significant improvements of symptoms with mental diuresis and no current evidence of acute coronary syndrome 4.  If patient is ambulating well with no further significant symptoms and improved as for above would be okay for discharged home with follow-up next week with further adjustments of medications  Signed, Serafina Royals M.D. FACC

## 2020-07-10 DIAGNOSIS — K219 Gastro-esophageal reflux disease without esophagitis: Secondary | ICD-10-CM

## 2020-07-10 DIAGNOSIS — I10 Essential (primary) hypertension: Secondary | ICD-10-CM | POA: Diagnosis not present

## 2020-07-10 DIAGNOSIS — N1831 Chronic kidney disease, stage 3a: Secondary | ICD-10-CM | POA: Diagnosis not present

## 2020-07-10 DIAGNOSIS — H3530 Unspecified macular degeneration: Secondary | ICD-10-CM

## 2020-07-10 DIAGNOSIS — E1142 Type 2 diabetes mellitus with diabetic polyneuropathy: Secondary | ICD-10-CM | POA: Diagnosis not present

## 2020-07-10 DIAGNOSIS — I714 Abdominal aortic aneurysm, without rupture: Secondary | ICD-10-CM

## 2020-07-10 DIAGNOSIS — I5031 Acute diastolic (congestive) heart failure: Secondary | ICD-10-CM | POA: Diagnosis not present

## 2020-07-23 DIAGNOSIS — I5032 Chronic diastolic (congestive) heart failure: Secondary | ICD-10-CM | POA: Insufficient documentation

## 2020-07-23 DIAGNOSIS — I2510 Atherosclerotic heart disease of native coronary artery without angina pectoris: Secondary | ICD-10-CM | POA: Insufficient documentation

## 2020-07-23 DIAGNOSIS — I5033 Acute on chronic diastolic (congestive) heart failure: Secondary | ICD-10-CM | POA: Insufficient documentation

## 2020-07-31 DIAGNOSIS — N1831 Chronic kidney disease, stage 3a: Secondary | ICD-10-CM | POA: Diagnosis not present

## 2020-07-31 DIAGNOSIS — E1142 Type 2 diabetes mellitus with diabetic polyneuropathy: Secondary | ICD-10-CM | POA: Diagnosis not present

## 2020-07-31 DIAGNOSIS — I5032 Chronic diastolic (congestive) heart failure: Secondary | ICD-10-CM | POA: Diagnosis not present

## 2020-07-31 DIAGNOSIS — I10 Essential (primary) hypertension: Secondary | ICD-10-CM | POA: Diagnosis not present

## 2020-08-05 ENCOUNTER — Ambulatory Visit: Payer: Medicare Other | Admitting: Family

## 2020-08-06 ENCOUNTER — Other Ambulatory Visit: Payer: Self-pay | Admitting: Internal Medicine

## 2020-08-06 NOTE — Telephone Encounter (Signed)
Is she in assisted living at [facility]?

## 2020-08-06 NOTE — Telephone Encounter (Signed)
Yes--I saw her in health care for rehab and now she is continuing in my care at [place]. Okay to fill for a year

## 2020-08-06 NOTE — Telephone Encounter (Signed)
Rx sent electronically.  

## 2020-08-21 ENCOUNTER — Other Ambulatory Visit: Payer: Self-pay

## 2020-08-21 ENCOUNTER — Ambulatory Visit: Payer: Medicare Other | Admitting: Internal Medicine

## 2020-08-28 ENCOUNTER — Ambulatory Visit: Payer: Medicare Other | Admitting: Internal Medicine

## 2020-08-28 ENCOUNTER — Encounter: Payer: Self-pay | Admitting: Internal Medicine

## 2020-08-28 VITALS — BP 144/74 | HR 80 | Temp 97.5°F | Resp 16 | Wt 133.0 lb

## 2020-08-28 DIAGNOSIS — N1831 Chronic kidney disease, stage 3a: Secondary | ICD-10-CM | POA: Diagnosis not present

## 2020-08-28 DIAGNOSIS — K219 Gastro-esophageal reflux disease without esophagitis: Secondary | ICD-10-CM

## 2020-08-28 DIAGNOSIS — I5032 Chronic diastolic (congestive) heart failure: Secondary | ICD-10-CM

## 2020-08-28 DIAGNOSIS — I714 Abdominal aortic aneurysm, without rupture, unspecified: Secondary | ICD-10-CM

## 2020-08-28 DIAGNOSIS — H353290 Exudative age-related macular degeneration, unspecified eye, stage unspecified: Secondary | ICD-10-CM | POA: Diagnosis not present

## 2020-08-28 DIAGNOSIS — E1159 Type 2 diabetes mellitus with other circulatory complications: Secondary | ICD-10-CM

## 2020-08-28 LAB — HM DIABETES EYE EXAM

## 2020-08-28 NOTE — Assessment & Plan Note (Signed)
Doing okay without Rx for now

## 2020-08-28 NOTE — Assessment & Plan Note (Signed)
Sugars still low on degludec and metformin Will reduce the insulin to 5 units

## 2020-08-28 NOTE — Assessment & Plan Note (Signed)
Compensated now Has adjusted in assisted living and may do better with the help she gets here On furosemide

## 2020-08-28 NOTE — Progress Notes (Addendum)
Subjective:    Patient ID: Andrea Lawson, female    DOB: Jan 16, 1932, 87 y.o.   MRN: [id number]  HPI Visit in assisted living apartment for review of chronic health conditions Daughter Andrea Lawson is here  Mostly has adjusted here Concerned about the vent for A/C---needs diversion  Some discomfort and increase in size of large ventral hernia Bowels move slow--but okay Appetite is pretty good Weight seems to have stabilized  No SOB No recent edema No chest pain or SOB Sleeps flat in bed--no PND  Sugars running low lantus was cut to 10 units--but still under 100 at times  Recent GFR 56  No recent heartburn No dysphagia--except if too many pills  Current Outpatient Medications on File Prior to Visit  Medication Sig Dispense Refill  . calcium carbonate (TUMS EX) 750 MG chewable tablet Chew 1 tablet by mouth every 2 (two) hours as needed for heartburn.    . carvedilol (COREG) 6.25 MG tablet Take 6.25 mg by mouth 2 (two) times daily with a meal.    . cholecalciferol (VITAMIN D) 400 UNITS TABS tablet Take 1,000 Units by mouth.    . Cyanocobalamin (VITAMIN B 12 PO) Take 1,000 mcg by mouth daily.    . furosemide (LASIX) 20 MG tablet TAKE ONE TABLET BY MOUTH EVERY DAY 31 tablet 11  . insulin degludec (TRESIBA) 100 UNIT/ML FlexTouch Pen Inject 15 Units into the skin daily at 10 pm.    . latanoprost (XALATAN) 0.005 % ophthalmic solution Place 1 drop into both eyes every evening.    . metFORMIN (GLUCOPHAGE) 500 MG tablet TAKE ONE TABLET BY MOUTH TWICE DAILY 62 tablet 11  . potassium chloride (KLOR-CON) 10 MEQ tablet TAKE ONE TABLET BY MOUTH EVERY DAY 31 tablet 11   No current facility-administered medications on file prior to visit.    Allergies  Allergen Reactions  . Simvastatin     Other reaction(s): Other (See Comments) Other Reaction: Myositis  . Ace Inhibitors     Other reaction(s): Cough  . Angiotensin Receptor Blockers Cough    Other reaction(s): Cough  . Losartan     Other  reaction(s): Unknown  . Penicillins Rash    Other reaction(s): Other (See Comments) Other Reaction: ALLERGY     Past Medical History:  Diagnosis Date  . AAA (abdominal aortic aneurysm) ([place])   . Allergic state   . Anemia    IRON DEF.ANEMIA  . Arthritis    OSTEOARTHRITIS OF BIL.KNEES  . B12 deficiency   . Basal cell carcinoma   . Callus of foot    bil.  . Cancer ([place])    of CECUM  . Cataract cortical, senile    RIGHT EYE  . Cholesterol-lowering agent myopathy 2004  . Chronic diastolic heart failure ([place])   . Chronic kidney disease, stage 3a ([place])   . Diabetes mellitus without complication ([place])   . Diabetic neuropathy ([place])   . Diverticulosis   . Drug-induced myopathy   . Eczema   . GERD (gastroesophageal reflux disease)   . Gout   . Hemorrhoids   . History of chicken pox   . History of hypercalcemia   . Hypercholesterolemia   . Hyperparathyroidism ([place])   . Hypertension   . Hypothyroidism   . Incontinent of urine   . Lumbago   . Mycotic toenails   . Neuropathy due to secondary diabetes mellitus ([place])   . Wet senile macular degeneration [facility])     Past Surgical History:  Procedure Laterality  Date  . BUNIONECTOMY  2006  . COLONOSCOPY    . COLONOSCOPY WITH PROPOFOL N/A 12/09/2014   Procedure: COLONOSCOPY WITH PROPOFOL;  Surgeon: Lollie Sails, MD;  Location: [facility] ENDOSCOPY;  Service: Endoscopy;  Laterality: N/A;  . COLONOSCOPY WITH PROPOFOL N/A 05/03/2017   Procedure: COLONOSCOPY WITH PROPOFOL;  Surgeon: Lollie Sails, MD;  Location: [facility] ENDOSCOPY;  Service: Endoscopy;  Laterality: N/A;  . DEBRIDEMENT OF CALLUSES Bilateral    FEET  . DIAGNOSTIC LAPAROSCOPY    . ESOPHAGOGASTRODUODENOSCOPY    . EYE SURGERY    . GOUT SURGERY    . KNEE ARTHROSCOPY    . LAPAROSCOPIC PARTIAL COLECTOMY Right 03/16/2010  . MOHS SURGERY  12/2008  . MOUTH SURGERY    . PARATHYROIDECTOMY  2001  . TONSILLECTOMY      History reviewed. No pertinent family history.  Social  History   Socioeconomic History  . Marital status: Married    Spouse name: Not on file  . Number of children: Not on file  . Years of education: Not on file  . Highest education level: Not on file  Occupational History  . Not on file  Tobacco Use  . Smoking status: Former Research scientist (life sciences)  . Smokeless tobacco: Never Used  Vaping Use  . Vaping Use: Never used  Substance and Sexual Activity  . Alcohol use: Yes    Comment: WINE  . Drug use: No  . Sexual activity: Not on file  Other Topics Concern  . Not on file  Social History Narrative  . Not on file   Social Determinants of Health   Financial Resource Strain: Not on file  Food Insecurity: Not on file  Transportation Needs: Not on file  Physical Activity: Not on file  Stress: Not on file  Social Connections: Not on file  Intimate Partner Violence: Not on file   Review of Systems  Mild hand numbness at times--no sig pain Sleeps okay mostly Vision is stable with regular injections     Objective:   Physical Exam Constitutional:      Appearance: Normal appearance.  Cardiovascular:     Rate and Rhythm: Normal rate and regular rhythm.     Heart sounds: No gallop.      Comments: Soft systolic murmur Pulmonary:     Effort: Pulmonary effort is normal.     Breath sounds: Normal breath sounds. No wheezing or rales.  Abdominal:     Palpations: Abdomen is soft.     Tenderness: There is no abdominal tenderness.     Comments: Large reducible ventral hernia to right of midline under umbilicus  Musculoskeletal:     Cervical back: Neck supple.     Right lower leg: No edema.     Left lower leg: No edema.  Lymphadenopathy:     Cervical: No cervical adenopathy.  Skin:    Findings: No rash.  Neurological:     Mental Status: She is alert.  Psychiatric:        Mood and Affect: Mood normal.        Behavior: Behavior normal.            Assessment & Plan:

## 2020-08-28 NOTE — Assessment & Plan Note (Signed)
Will monitor regularly on furosemide Consider low dose ARB if BP stays up

## 2020-08-28 NOTE — Assessment & Plan Note (Signed)
Only slightly over 4cm last time Doubt action will be needed

## 2020-09-01 LAB — COMPREHENSIVE METABOLIC PANEL: GFR calc non Af Amer: 53

## 2020-09-01 LAB — HEMOGLOBIN A1C: Hemoglobin A1C: 7

## 2020-09-04 ENCOUNTER — Encounter: Payer: Self-pay | Admitting: Internal Medicine

## 2020-10-07 ENCOUNTER — Other Ambulatory Visit: Payer: Self-pay | Admitting: Internal Medicine

## 2020-10-22 ENCOUNTER — Other Ambulatory Visit: Payer: Self-pay | Admitting: Internal Medicine

## 2020-10-23 ENCOUNTER — Other Ambulatory Visit: Payer: Self-pay | Admitting: Internal Medicine

## 2020-10-23 MED ORDER — INSULIN DEGLUDEC 100 UNIT/ML ~~LOC~~ SOPN: 5.0000 [IU] | PEN_INJECTOR | Freq: Every day | SUBCUTANEOUS | 11 refills | Status: DC

## 2020-10-23 MED ORDER — LATANOPROST 0.005 % OP SOLN: 1.0000 [drp] | Freq: Every evening | OPHTHALMIC | 11 refills | Status: DC

## 2020-10-27 ENCOUNTER — Other Ambulatory Visit: Payer: Self-pay | Admitting: Internal Medicine

## 2020-10-27 MED ORDER — INSULIN PEN NEEDLE 32G X 4 MM MISC: 1.0000 | Freq: Every day | 3 refills | Status: DC

## 2020-10-27 NOTE — Progress Notes (Signed)
nano

## 2020-10-30 ENCOUNTER — Other Ambulatory Visit: Payer: Self-pay | Admitting: Internal Medicine

## 2020-12-05 ENCOUNTER — Non-Acute Institutional Stay: Payer: Medicare Other | Admitting: Internal Medicine

## 2020-12-05 ENCOUNTER — Encounter: Payer: Self-pay | Admitting: Internal Medicine

## 2020-12-05 DIAGNOSIS — K219 Gastro-esophageal reflux disease without esophagitis: Secondary | ICD-10-CM

## 2020-12-05 DIAGNOSIS — N1831 Chronic kidney disease, stage 3a: Secondary | ICD-10-CM

## 2020-12-05 DIAGNOSIS — I5032 Chronic diastolic (congestive) heart failure: Secondary | ICD-10-CM | POA: Diagnosis not present

## 2020-12-05 DIAGNOSIS — I714 Abdominal aortic aneurysm, without rupture, unspecified: Secondary | ICD-10-CM

## 2020-12-05 DIAGNOSIS — E1159 Type 2 diabetes mellitus with other circulatory complications: Secondary | ICD-10-CM | POA: Diagnosis not present

## 2020-12-05 DIAGNOSIS — K458 Other specified abdominal hernia without obstruction or gangrene: Secondary | ICD-10-CM

## 2020-12-05 DIAGNOSIS — K469 Unspecified abdominal hernia without obstruction or gangrene: Secondary | ICD-10-CM | POA: Insufficient documentation

## 2020-12-05 DIAGNOSIS — I10 Essential (primary) hypertension: Secondary | ICD-10-CM

## 2020-12-05 NOTE — Assessment & Plan Note (Signed)
Asymptomatic. 

## 2020-12-05 NOTE — Assessment & Plan Note (Signed)
Encouraged her to wear the abdominal binder

## 2020-12-05 NOTE — Assessment & Plan Note (Signed)
Not currently on an ACEI/ARB Monitor kidney function

## 2020-12-05 NOTE — Assessment & Plan Note (Signed)
Continue metformin and Lantus Will monitor A1c every 6 months

## 2020-12-05 NOTE — Progress Notes (Signed)
Subjective:    Patient ID: Andrea Lawson, female    DOB: Nov 03, 1931, 87 y.o.   MRN: [id number]  HPI  Resident seen in [address] No new concerns from staff Resident is concerned about urinary frequency.  She denies urgency, dysuria or blood in her urine. Otherwise she reports she sleeps well.  Her appetite is good.  She walks with a [place].  She does have some mild constipation at times but is able to control this with OTC medicines.  She denies joint pain.  She denies chest pain, shortness of breath or reflux.  She reports her mood is good.  CHF: She denies cough, shortness of breath or lower extremity edema at this time.  She is taking Carvedilol, Furosemide and Potassium as prescribed.  Echo from 06/2020 reviewed.  HTN: Her BP today is 140/77, managed on Carvedilol.  ECG from 06/2020 reviewed.  CKD 3: Her last creatinine was 0.97, GFR 53, 08/2020.  She is not currently on an ACEI or ARB at this time.  GERD: She denies any issues with reflux at this time.  She is not currently taking any medications for this.  DM2: Her last A1c was 7%, 08/2020.  She is taking Metformin and Lantus as prescribed.  Review of Systems     Past Medical History:  Diagnosis Date   AAA (abdominal aortic aneurysm) ([place])    Allergic state    Anemia    IRON DEF.ANEMIA   Arthritis    OSTEOARTHRITIS OF BIL.KNEES   B12 deficiency    Basal cell carcinoma    Callus of foot    bil.   Cancer ([place])    of CECUM   Cataract cortical, senile    RIGHT EYE   Cholesterol-lowering agent myopathy 2004   Chronic diastolic heart failure (HCC)    Chronic kidney disease, stage 3a (HCC)    Diabetes mellitus without complication (HCC)    Diabetic neuropathy (HCC)    Diverticulosis    Drug-induced myopathy    Eczema    GERD (gastroesophageal reflux disease)    Gout    Hemorrhoids    History of chicken pox    History of hypercalcemia    Hypercholesterolemia    Hyperparathyroidism ([place])    Hypertension    Hypothyroidism     Incontinent of urine    Lumbago    Mycotic toenails    Neuropathy due to secondary diabetes mellitus (HCC)    Wet senile macular degeneration (HCC)     Current Outpatient Medications  Medication Sig Dispense Refill   calcium carbonate (TUMS EX) 750 MG chewable tablet Chew 1 tablet by mouth every 2 (two) hours as needed for heartburn.     carvedilol (COREG) 6.25 MG tablet TAKE ONE TABLET BY MOUTH TWICE DAILY 62 tablet 11   cholecalciferol (VITAMIN D) 400 UNITS TABS tablet Take 1,000 Units by mouth.     Cyanocobalamin (VITAMIN B 12 PO) Take 1,000 mcg by mouth daily.     furosemide (LASIX) 20 MG tablet TAKE ONE TABLET BY MOUTH EVERY DAY 31 tablet 11   insulin degludec (TRESIBA) 100 UNIT/ML FlexTouch Pen Inject 5 Units into the skin daily at 10 pm. 3 mL 11   Insulin Pen Needle 32G X 4 MM MISC 1 Dose by Does not apply route daily. 90 each 3   latanoprost (XALATAN) 0.005 % ophthalmic solution Place 1 drop into both eyes every evening. 2.5 mL 11   metFORMIN (GLUCOPHAGE) 500 MG tablet TAKE ONE TABLET  BY MOUTH TWICE DAILY 62 tablet 11   potassium chloride (KLOR-CON) 10 MEQ tablet TAKE ONE TABLET BY MOUTH EVERY DAY 31 tablet 11   No current facility-administered medications for this visit.    Allergies  Allergen Reactions   Simvastatin     Other reaction(s): Other (See Comments) Other Reaction: Myositis   Ace Inhibitors     Other reaction(s): Cough   Angiotensin Receptor Blockers Cough    Other reaction(s): Cough   Losartan     Other reaction(s): Unknown   Penicillins Rash    Other reaction(s): Other (See Comments) Other Reaction: ALLERGY     No family history on file.  Social History   Socioeconomic History   Marital status: Married    Spouse name: Not on file   Number of children: Not on file   Years of education: Not on file   Highest education level: Not on file  Occupational History   Not on file  Tobacco Use   Smoking status: Former   Smokeless tobacco: Never   Vaping Use   Vaping Use: Never used  Substance and Sexual Activity   Alcohol use: Yes    Comment: WINE   Drug use: No   Sexual activity: Not on file  Other Topics Concern   Not on file  Social History Narrative   Not on file   Social Determinants of Health   Financial Resource Strain: Not on file  Food Insecurity: Not on file  Transportation Needs: Not on file  Physical Activity: Not on file  Stress: Not on file  Social Connections: Not on file  Intimate Partner Violence: Not on file     Constitutional: Denies fever, malaise, fatigue, headache or abrupt weight changes.  HEENT: Denies eye pain, eye redness, ear pain, ringing in the ears, wax buildup, runny nose, nasal congestion, bloody nose, or sore throat. Respiratory: Denies difficulty breathing, shortness of breath, cough or sputum production.   Cardiovascular: Denies chest pain, chest tightness, palpitations or swelling in the hands or feet.  Gastrointestinal: Patient reports abdominal hernia, intermittent constipation.  Denies abdominal pain, bloating, diarrhea or blood in the stool.  GU: Patient reports urinary frequency.  Denies urgency, pain with urination, burning sensation, blood in urine, odor or discharge. Musculoskeletal: Denies decrease in range of motion, difficulty with gait, muscle pain or joint pain and swelling.  Skin: Denies redness, rashes, lesions or ulcercations.  Neurological: Denies dizziness, difficulty with memory, difficulty with speech or problems with balance and coordination.  Psych: Denies anxiety, depression, SI/HI.  No other specific complaints in a complete review of systems (except as listed in HPI above).  Objective:   Physical Exam   BP 140/77   Pulse 89   Temp 98 F (36.7 C)   Resp 17   Wt 138 lb 9.6 oz (62.9 kg)   SpO2 96%   BMI 27.07 kg/m  Wt Readings from Last 3 Encounters:  12/05/20 138 lb 9.6 oz (62.9 kg)  08/28/20 133 lb (60.3 kg)  07/09/20 124 lb 9 oz (56.5 kg)     General: Appears her stated age, well developed, well nourished in NAD. Skin: Warm, dry and intact. No ulcerations noted. HEENT: Head: normal shape and size;  Neck:  Neck supple, trachea midline. No masses, lumps or thyromegaly present.  Cardiovascular: Normal rate and rhythm. S1,S2 noted.  Murmur noted. No JVD or BLE edema.  Pulmonary/Chest: Normal effort and positive vesicular breath sounds. No respiratory distress. No wheezes, rales or ronchi noted.  Abdomen: Large hernia noted. Musculoskeletal: Gait slow and steady with use of [place]. Neurological: Alert and oriented.  Psychiatric: Mood and affect normal. Behavior is normal. Judgment and thought content normal.     BMET    Component Value Date/Time   NA 140 07/08/2020 0410   NA 141 12/07/2013 1035   K 3.4 (L) 07/08/2020 0410   K 4.3 12/07/2013 1035   CL 97 (L) 07/08/2020 0410   CL 103 12/07/2013 1035   CO2 32 07/08/2020 0410   CO2 26 12/07/2013 1035   GLUCOSE 154 (H) 07/08/2020 0410   GLUCOSE 242 (H) 12/07/2013 1035   BUN 16 07/08/2020 0410   BUN 16 12/07/2013 1035   CREATININE 1.21 (H) 07/08/2020 0410   CREATININE 1.24 12/07/2013 1035   CALCIUM 8.6 (L) 07/08/2020 0410   CALCIUM 8.5 12/07/2013 1035   GFRNONAA 53 09/01/2020 0000   GFRNONAA 44 (L) 07/08/2020 0410   GFRNONAA 41 (L) 12/07/2013 1035   GFRAA HEMOLYZED SPECIMEN - SUGGEST RECOLLECT 07/07/2020 0133   GFRAA 48 (L) 12/07/2013 1035    Lipid Panel  No results found for: CHOL, TRIG, HDL, CHOLHDL, VLDL, LDLCALC  CBC    Component Value Date/Time   WBC 7.3 07/08/2020 0410   RBC 4.10 07/08/2020 0410   HGB 10.8 (L) 07/08/2020 0410   HGB 12.1 12/07/2013 1035   HCT 36.3 07/08/2020 0410   HCT 37.6 12/07/2013 1035   PLT 143 (L) 07/08/2020 0410   PLT 138 (L) 12/07/2013 1035   MCV 88.5 07/08/2020 0410   MCV 87 12/07/2013 1035   MCH 26.3 07/08/2020 0410   MCHC 29.8 (L) 07/08/2020 0410   RDW 14.6 07/08/2020 0410   RDW 13.6 12/07/2013 1035   LYMPHSABS 1.2  07/07/2020 0133   LYMPHSABS 1.4 12/07/2013 1035   MONOABS 0.5 07/07/2020 0133   MONOABS 0.3 12/07/2013 1035   EOSABS 0.3 07/07/2020 0133   EOSABS 0.2 12/07/2013 1035   BASOSABS 0.0 07/07/2020 0133   BASOSABS 0.1 12/07/2013 1035    Hgb A1C Lab Results  Component Value Date   HGBA1C 7.0 09/01/2020           Assessment & Plan:   Webb Silversmith, NP This visit occurred during the SARS-CoV-2 public health emergency.  Safety protocols were in place, including screening questions prior to the visit, additional usage of staff PPE, and extensive cleaning of exam room while observing appropriate contact time as indicated for disinfecting solutions.

## 2020-12-05 NOTE — Assessment & Plan Note (Signed)
Currently not an issue off meds 

## 2020-12-05 NOTE — Assessment & Plan Note (Signed)
Controlled on carvedilol Monitor

## 2020-12-05 NOTE — Assessment & Plan Note (Signed)
Compensated on carvedilol, furosemide and potassium Monitor weights

## 2021-02-07 ENCOUNTER — Other Ambulatory Visit: Payer: Self-pay | Admitting: Internal Medicine

## 2021-02-26 ENCOUNTER — Other Ambulatory Visit: Payer: Self-pay

## 2021-02-26 ENCOUNTER — Ambulatory Visit: Payer: Medicare Other | Admitting: Internal Medicine

## 2021-02-26 ENCOUNTER — Encounter: Payer: Self-pay | Admitting: Internal Medicine

## 2021-02-26 DIAGNOSIS — I5032 Chronic diastolic (congestive) heart failure: Secondary | ICD-10-CM | POA: Diagnosis not present

## 2021-02-26 DIAGNOSIS — H353230 Exudative age-related macular degeneration, bilateral, stage unspecified: Secondary | ICD-10-CM

## 2021-02-26 DIAGNOSIS — E1159 Type 2 diabetes mellitus with other circulatory complications: Secondary | ICD-10-CM

## 2021-02-26 DIAGNOSIS — I10 Essential (primary) hypertension: Secondary | ICD-10-CM

## 2021-02-26 DIAGNOSIS — N1831 Chronic kidney disease, stage 3a: Secondary | ICD-10-CM

## 2021-02-26 NOTE — Progress Notes (Signed)
Subjective:    Patient ID: Andrea Lawson, female    DOB: October 21, 1931, 87 y.o.   MRN: [id number]  HPI Visit in assisted living apartment for follow up of chronic health conditions Reviewed status with Luellen Pucker RN--concerned about her bunion  Overall doing okay Some diarrhea today and incontinence---this is new No abdominal pain---she is just concerned about the size of her abdomen No N/V Appetite is fine---good breakfast today Some chronic urinary incontinence  Uses [employer]  No exercise---discussed Independent with ADLs  Sugars have been good. Only one under 100--and no symptoms Generally under 150 and rarely up to 190's Slight feet tingling--but no pain of note  No chest pain No SOB No dizziness or syncope Mild edema---mostly later in the day  Last GFR 49  Current Outpatient Medications on File Prior to Visit  Medication Sig Dispense Refill   carvedilol (COREG) 6.25 MG tablet TAKE ONE TABLET BY MOUTH TWICE DAILY 62 tablet 11   cholecalciferol (VITAMIN D) 400 UNITS TABS tablet Take 1,000 Units by mouth.     Cyanocobalamin (VITAMIN B 12 PO) Take 1,000 mcg by mouth daily.     furosemide (LASIX) 20 MG tablet TAKE ONE TABLET BY MOUTH EVERY DAY 31 tablet 11   insulin degludec (TRESIBA) 100 UNIT/ML FlexTouch Pen Inject 5 Units into the skin daily at 10 pm. 3 mL 11   Insulin Pen Needle 32G X 4 MM MISC 1 Dose by Does not apply route daily. 90 each 3   latanoprost (XALATAN) 0.005 % ophthalmic solution Place 1 drop into both eyes every evening. 2.5 mL 11   metFORMIN (GLUCOPHAGE) 500 MG tablet TAKE ONE TABLET BY MOUTH TWICE DAILY 62 tablet 11   potassium chloride (KLOR-CON) 10 MEQ tablet TAKE ONE TABLET BY MOUTH TWICE DAILY 180 tablet 3   No current facility-administered medications on file prior to visit.    Allergies  Allergen Reactions   Simvastatin     Other reaction(s): Other (See Comments) Other Reaction: Myositis   Ace Inhibitors     Other reaction(s): Cough   Angiotensin  Receptor Blockers Cough    Other reaction(s): Cough   Losartan     Other reaction(s): Unknown   Penicillins Rash    Other reaction(s): Other (See Comments) Other Reaction: ALLERGY     Past Medical History:  Diagnosis Date   AAA (abdominal aortic aneurysm)    Allergic state    Anemia    IRON DEF.ANEMIA   Arthritis    OSTEOARTHRITIS OF BIL.KNEES   B12 deficiency    Basal cell carcinoma    Callus of foot    bil.   Cancer ([place])    of CECUM   Cataract cortical, senile    RIGHT EYE   Cholesterol-lowering agent myopathy 2004   Chronic diastolic heart failure (HCC)    Chronic kidney disease, stage 3a (HCC)    Diabetes mellitus without complication (HCC)    Diabetic neuropathy (HCC)    Diverticulosis    Drug-induced myopathy    Eczema    GERD (gastroesophageal reflux disease)    Gout    Hemorrhoids    History of chicken pox    History of hypercalcemia    Hypercholesterolemia    Hyperparathyroidism ([place])    Hypertension    Hypothyroidism    Incontinent of urine    Lumbago    Mycotic toenails    Neuropathy due to secondary diabetes mellitus ([place])    Wet senile macular degeneration ([place])  Past Surgical History:  Procedure Laterality Date   BUNIONECTOMY  2006   COLONOSCOPY     COLONOSCOPY WITH PROPOFOL N/A 12/09/2014   Procedure: COLONOSCOPY WITH PROPOFOL;  Surgeon: Lollie Sails, MD;  [facility] ENDOSCOPY;  Service: Endoscopy;  Laterality: N/A;   COLONOSCOPY WITH PROPOFOL N/A 05/03/2017   Procedure: COLONOSCOPY WITH PROPOFOL;  Surgeon: Lollie Sails, MD;  Location: [facility] ENDOSCOPY;  Service: Endoscopy;  Laterality: N/A;   DEBRIDEMENT OF CALLUSES Bilateral    FEET   DIAGNOSTIC LAPAROSCOPY     ESOPHAGOGASTRODUODENOSCOPY     EYE SURGERY     GOUT SURGERY     KNEE ARTHROSCOPY     LAPAROSCOPIC PARTIAL COLECTOMY Right 03/16/2010   MOHS SURGERY  12/2008   MOUTH SURGERY     PARATHYROIDECTOMY  2001   TONSILLECTOMY      History reviewed. No pertinent  family history.  Social History   Socioeconomic History   Marital status: Married    Spouse name: Not on file   Number of children: Not on file   Years of education: Not on file   Highest education level: Not on file  Occupational History   Not on file  Tobacco Use   Smoking status: Former   Smokeless tobacco: Never  Vaping Use   Vaping Use: Never used  Substance and Sexual Activity   Alcohol use: Yes    Comment: WINE   Drug use: No   Sexual activity: Not on file  Other Topics Concern   Not on file  Social History Narrative   Not on file   Social Determinants of Health   Financial Resource Strain: Not on file  Food Insecurity: Not on file  Transportation Needs: Not on file  Physical Activity: Not on file  Stress: Not on file  Social Connections: Not on file  Intimate Partner Violence: Not on file   Review of Systems Appetite is okay Weight stable Sleep interrupted by nocturia--but does okay Continues on eye injections for MD    Objective:   Physical Exam Constitutional:      Appearance: Normal appearance.  Cardiovascular:     Rate and Rhythm: Normal rate and regular rhythm.     Heart sounds:    No gallop.     Comments: Gr 3/6 aortic systolic murmur Pulmonary:     Effort: Pulmonary effort is normal.     Breath sounds: Normal breath sounds. No wheezing or rales.  Abdominal:     Palpations: Abdomen is soft.     Tenderness: There is no abdominal tenderness.     Comments: Moderate sized ventral hernia --reduces  Musculoskeletal:     Cervical back: Neck supple.     Comments: Trace ankle edema Bunion left foot---with thick callous with slight open area. Odor but doesn't seem inflamed  Lymphadenopathy:     Cervical: No cervical adenopathy.  Neurological:     Mental Status: She is alert.  Psychiatric:        Mood and Affect: Mood normal.        Behavior: Behavior normal.           Assessment & Plan:

## 2021-02-26 NOTE — Assessment & Plan Note (Signed)
Sugars are good on low dose insulin and metformin Fecal incontinence today seems unrelated to metformin

## 2021-02-26 NOTE — Assessment & Plan Note (Signed)
BP Readings from Last 3 Encounters:  02/26/21 129/72  12/05/20 140/77  08/28/20 (!) 144/74   Controlled on carvedilol and furosemide

## 2021-02-26 NOTE — Assessment & Plan Note (Signed)
This seems to be compensated Minimal edema on low dose furosemide

## 2021-02-26 NOTE — Assessment & Plan Note (Signed)
Continues on the injections

## 2021-02-26 NOTE — Assessment & Plan Note (Signed)
Mild No action at her age

## 2021-05-22 ENCOUNTER — Telehealth: Payer: Self-pay | Admitting: Internal Medicine

## 2021-05-22 MED ORDER — ONETOUCH ULTRA VI STRP: ORAL_STRIP | 12 refills | Status: DC

## 2021-05-22 NOTE — Telephone Encounter (Signed)
°  Encourage patient to contact the pharmacy for refills or they can request refills through [place]:  Please schedule appointment if longer than 1 year  NEXT APPOINTMENT DATE:  MEDICATION:  one touch ultra test strips  Is the patient out of medication? yes  [facility]  Let patient know to contact pharmacy at the end of the day to make sure medication is ready.  Please notify patient to allow 48-72 hours to process  CLINICAL FILLS OUT ALL BELOW:   LAST REFILL:  QTY:  REFILL DATE:    OTHER COMMENTS:    Okay for refill?  Please advise

## 2021-05-22 NOTE — Addendum Note (Signed)
Addended by: Pilar Grammes on: 05/22/2021 04:14 PM   Modules accepted: Orders

## 2021-05-29 ENCOUNTER — Non-Acute Institutional Stay: Payer: Medicare Other | Admitting: Internal Medicine

## 2021-05-29 ENCOUNTER — Other Ambulatory Visit: Payer: Self-pay

## 2021-05-29 ENCOUNTER — Encounter: Payer: Self-pay | Admitting: Internal Medicine

## 2021-05-29 DIAGNOSIS — I714 Abdominal aortic aneurysm, without rupture, unspecified: Secondary | ICD-10-CM

## 2021-05-29 DIAGNOSIS — K458 Other specified abdominal hernia without obstruction or gangrene: Secondary | ICD-10-CM

## 2021-05-29 DIAGNOSIS — D649 Anemia, unspecified: Secondary | ICD-10-CM | POA: Insufficient documentation

## 2021-05-29 DIAGNOSIS — N1831 Chronic kidney disease, stage 3a: Secondary | ICD-10-CM

## 2021-05-29 DIAGNOSIS — D631 Anemia in chronic kidney disease: Secondary | ICD-10-CM

## 2021-05-29 DIAGNOSIS — I700 Atherosclerosis of aorta: Secondary | ICD-10-CM

## 2021-05-29 DIAGNOSIS — I5032 Chronic diastolic (congestive) heart failure: Secondary | ICD-10-CM | POA: Diagnosis not present

## 2021-05-29 DIAGNOSIS — D696 Thrombocytopenia, unspecified: Secondary | ICD-10-CM

## 2021-05-29 DIAGNOSIS — K219 Gastro-esophageal reflux disease without esophagitis: Secondary | ICD-10-CM

## 2021-05-29 DIAGNOSIS — I10 Essential (primary) hypertension: Secondary | ICD-10-CM | POA: Diagnosis not present

## 2021-05-29 DIAGNOSIS — E1159 Type 2 diabetes mellitus with other circulatory complications: Secondary | ICD-10-CM

## 2021-05-29 NOTE — Assessment & Plan Note (Signed)
CT chest reviewed Continue Carvedilol Will continue to monitor

## 2021-05-29 NOTE — Assessment & Plan Note (Signed)
Compensated Continue Carvedilol and Furosemide Monitor weights

## 2021-05-29 NOTE — Assessment & Plan Note (Signed)
Continue Carvedilol Not on aspirin secondary to thrombocytopenia

## 2021-05-29 NOTE — Assessment & Plan Note (Signed)
Not currently on ACEI/ARB BMET yearly

## 2021-05-29 NOTE — Assessment & Plan Note (Signed)
CBC yearly Not on antiplatelet therapy

## 2021-05-29 NOTE — Progress Notes (Signed)
Subjective:    Patient ID: Andrea Lawson, female    DOB: Aug 11, 1931, 88 y.o.   MRN: [id number]  HPI  Resident seen in [address] Reviewed with RN- reports she was recently seen by podiatry, has a callous ulcer to left medial MTP. Chronic issue, non tender, no drainage. Treating with Betadine and dry dressing.  Resident denies concerns at this tim. Sleeps well. Walks with [place]. Denies recent falls. Independent with ADL's. Appetite good. Weight stable. Bowels are good. She reports some urinary incontinence. She denies pain, chest pain or shortness of breath. She reports her mood is "crabby".  AAA: 4 cm per last CT chest 06/2020. She is taking Carvedilol as prescribed.   CHF, Diastolic: She denies chronic cough or shortness of breath. She is taking Carvedilol, Furosemide and Potassium as prescribed. Echo from 06/2020 reviewed.  CKD 3: Her last creatinine was 0.97, GFR 53, 08/2020. She is not currently on ACEI/ARB  GERD: She denies any issues off medication. There is no upper GI on file.  HTN: Her BP today is  137/79. She is taking Carvedilol and Furosemide as prescribed. ECG from 06/2020 reviewed.  DM 2 with Diabetic Foot Ulcer: Her last A1C was 7%, 08/2020. She is taking Metformin and Tresbia as prescribed. She is seeing podiatry regarding her foot ulcer, which is currently closed at this time, but soft underneath.  Aortic Atherosclerosis: She is taking Carvedilol as prescribed. She is not currently on statin or ASA.  Anemia: Her last H/H was9.9/31.9, 08/2020. She is not taking oral iron at this time. She does not follow with hematology.  Thrombocytopenia: Her last platelet count was 119, 08/2020. She is not on ASA. She does not follow with hematology.  Review of Systems  Past Medical History:  Diagnosis Date   AAA (abdominal aortic aneurysm)    Allergic state    Anemia    IRON DEF.ANEMIA   Arthritis    OSTEOARTHRITIS OF BIL.KNEES   B12 deficiency    Basal cell carcinoma    Callus of  foot    bil.   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   Cataract cortical, senile    RIGHT EYE   Cholesterol-lowering agent myopathy 2004   Chronic diastolic heart failure (HCC)    Chronic kidney disease, stage 3a (HCC)    Diabetes mellitus without complication (HCC)    Diabetic neuropathy (HCC)    Diverticulosis    Drug-induced myopathy    Eczema    GERD (gastroesophageal reflux disease)    Gout    Hemorrhoids    History of chicken pox    History of hypercalcemia    Hypercholesterolemia    Hyperparathyroidism ([place])    Hypertension    Hypothyroidism    Incontinent of urine    Lumbago    Mycotic toenails    Neuropathy due to secondary diabetes mellitus (HCC)    Wet senile macular degeneration (HCC)     Current Outpatient Medications  Medication Sig Dispense Refill   carvedilol (COREG) 6.25 MG tablet TAKE ONE TABLET BY MOUTH TWICE DAILY 62 tablet 11   cholecalciferol (VITAMIN D) 400 UNITS TABS tablet Take 1,000 Units by mouth.     Cyanocobalamin (VITAMIN B 12 PO) Take 1,000 mcg by mouth daily.     furosemide (LASIX) 20 MG tablet TAKE ONE TABLET BY MOUTH EVERY DAY 31 tablet 11   insulin degludec (TRESIBA) 100 UNIT/ML FlexTouch Pen Inject 5 Units into the skin daily at 10 pm. 3 mL  11   latanoprost (XALATAN) 0.005 % ophthalmic solution Place 1 drop into both eyes every evening. 2.5 mL 11   metFORMIN (GLUCOPHAGE) 500 MG tablet TAKE ONE TABLET BY MOUTH TWICE DAILY 62 tablet 11   potassium chloride (KLOR-CON) 10 MEQ tablet TAKE ONE TABLET BY MOUTH TWICE DAILY 180 tablet 3   glucose blood (ONETOUCH ULTRA) test strip Use to check blood sugar once daily (Patient not taking: Reported on 05/29/2021) 100 each 12   Insulin Pen Needle 32G X 4 MM MISC 1 Dose by Does not apply route daily. (Patient not taking: Reported on 05/29/2021) 90 each 3   No current facility-administered medications for this visit.    Allergies  Allergen Reactions   Simvastatin     Other reaction(s): Other (See Comments) Other  Reaction: Myositis   Ace Inhibitors     Other reaction(s): Cough   Angiotensin Receptor Blockers Cough    Other reaction(s): Cough   Losartan     Other reaction(s): Unknown   Penicillins Rash    Other reaction(s): Other (See Comments) Other Reaction: ALLERGY     History reviewed. No pertinent family history.  Social History   Socioeconomic History   Marital status: Married    Spouse name: Not on file   Number of children: Not on file   Years of education: Not on file   Highest education level: Not on file  Occupational History   Not on file  Tobacco Use   Smoking status: Former   Smokeless tobacco: Never  Vaping Use   Vaping Use: Never used  Substance and Sexual Activity   Alcohol use: Yes    Comment: WINE   Drug use: No   Sexual activity: Not on file  Other Topics Concern   Not on file  Social History Narrative   Not on file   Social Determinants of Health   Financial Resource Strain: Not on file  Food Insecurity: Not on file  Transportation Needs: Not on file  Physical Activity: Not on file  Stress: Not on file  Social Connections: Not on file  Intimate Partner Violence: Not on file     Constitutional: Denies fever, malaise, fatigue, headache or abrupt weight changes.  HEENT: Denies eye pain, eye redness, ear pain, ringing in the ears, wax buildup, runny nose, nasal congestion, bloody nose, or sore throat. Respiratory: Pt reports dyspnea on exertion. Denies difficulty breathing,  cough or sputum production.   Cardiovascular: Pt reports swelling in his legs. Denies chest pain, chest tightness, palpitations or swelling in the hands.  Gastrointestinal: Denies abdominal pain, bloating, constipation, diarrhea or blood in the stool.  GU: Pt reports urinary incontinence. Denies urgency, frequency, pain with urination, burning sensation, blood in urine, odor or discharge. Musculoskeletal: Denies decrease in range of motion, difficulty with gait, muscle pain or  joint pain and swelling.  Skin: Pt reports scabbed callous to left foot. Denies redness, rashes, or ulcercations.  Neurological: Pt reports difficulty with balance. Denies dizziness, difficulty with memory, difficulty with speech or problems with balance and coordination.  Psych: Pt reports moodiness. Denies anxiety, depression, SI/HI.  No other specific complaints in a complete review of systems (except as listed in HPI above).     Objective:   Physical Exam   BP 139/79    Pulse 81    Temp 98.9 F (37.2 C)    Resp 18    Wt 144 lb (65.3 kg)    BMI 28.12 kg/m  Wt Readings from Last 3  Encounters:  05/29/21 144 lb (65.3 kg)  02/26/21 143 lb 11.2 oz (65.2 kg)  12/05/20 138 lb 9.6 oz (62.9 kg)    General: Appears her stated age, obese, chronically ill appearing, in NAD. Skin: Scabbed 3 cm callous noted to left medial MTP. HEENT: Head: normal shape and size; Eyes: EOMs intact; Neck:  Neck supple, trachea midline. No masses, lumps or thyromegaly present.  Cardiovascular: Normal rate and rhythm.  Murmur noted. Trace pitting edema.  Pulmonary/Chest: Normal effort and positive vesicular breath sounds. No respiratory distress. No wheezes, rales or ronchi noted.  Abdomen: Large abdominal hernia noted. Musculoskeletal: Gait slow and steady with use of [place]. Neurological: Alert and oriented.  Psychiatric: Mood and affect flat.  BMET   Lipid Panel  No results found for: CHOL, TRIG, HDL, CHOLHDL, VLDL, LDLCALC  CBC    Component Value Date/Time   WBC 7.3 07/08/2020 0410   RBC 4.10 07/08/2020 0410   HGB 10.8 (L) 07/08/2020 0410   HGB 12.1 12/07/2013 1035   HCT 36.3 07/08/2020 0410   HCT 37.6 12/07/2013 1035   PLT 143 (L) 07/08/2020 0410   PLT 138 (L) 12/07/2013 1035   MCV 88.5 07/08/2020 0410   MCV 87 12/07/2013 1035   MCH 26.3 07/08/2020 0410   MCHC 29.8 (L) 07/08/2020 0410   RDW 14.6 07/08/2020 0410   RDW 13.6 12/07/2013 1035   LYMPHSABS 1.2 07/07/2020 0133   LYMPHSABS 1.4  12/07/2013 1035   MONOABS 0.5 07/07/2020 0133   MONOABS 0.3 12/07/2013 1035   EOSABS 0.3 07/07/2020 0133   EOSABS 0.2 12/07/2013 1035   BASOSABS 0.0 07/07/2020 0133   BASOSABS 0.1 12/07/2013 1035    Hgb A1C Lab Results  Component Value Date   HGBA1C 7.0 09/01/2020           Assessment & Plan:   Webb Silversmith, NP This visit occurred during the SARS-CoV-2 public health emergency.  Safety protocols were in place, including screening questions prior to the visit, additional usage of staff PPE, and extensive cleaning of exam room while observing appropriate contact time as indicated for disinfecting solutions.

## 2021-05-29 NOTE — Assessment & Plan Note (Signed)
No issues off medications

## 2021-05-29 NOTE — Assessment & Plan Note (Signed)
No intervention needed at this time.

## 2021-05-29 NOTE — Assessment & Plan Note (Signed)
Controlled on Carvedilol and Furosemide We will monitor

## 2021-05-29 NOTE — Assessment & Plan Note (Signed)
Not currently on iron CBC yearly

## 2021-05-29 NOTE — Assessment & Plan Note (Signed)
Continue Metformin and Tresbia Encouraged low-carb diet She will continue to see podiatry regarding callus ulcer

## 2021-06-08 ENCOUNTER — Other Ambulatory Visit: Payer: Self-pay | Admitting: Internal Medicine

## 2021-07-01 ENCOUNTER — Other Ambulatory Visit: Payer: Self-pay | Admitting: Internal Medicine

## 2021-07-06 ENCOUNTER — Inpatient Hospital Stay
Admission: EM | Admit: 2021-07-06 | Discharge: 2021-07-11 | DRG: 617 | Disposition: A | Discharge status: Skilled Nursing Facility | Payer: Medicare Other | Attending: Internal Medicine | Admitting: Internal Medicine

## 2021-07-06 ENCOUNTER — Other Ambulatory Visit: Payer: Self-pay

## 2021-07-06 ENCOUNTER — Emergency Department: Payer: Medicare Other

## 2021-07-06 DIAGNOSIS — E039 Hypothyroidism, unspecified: Secondary | ICD-10-CM | POA: Diagnosis present

## 2021-07-06 DIAGNOSIS — E1122 Type 2 diabetes mellitus with diabetic chronic kidney disease: Secondary | ICD-10-CM | POA: Diagnosis present

## 2021-07-06 DIAGNOSIS — E1169 Type 2 diabetes mellitus with other specified complication: Principal | ICD-10-CM | POA: Diagnosis present

## 2021-07-06 DIAGNOSIS — E892 Postprocedural hypoparathyroidism: Secondary | ICD-10-CM | POA: Diagnosis present

## 2021-07-06 DIAGNOSIS — H353290 Exudative age-related macular degeneration, unspecified eye, stage unspecified: Secondary | ICD-10-CM | POA: Diagnosis present

## 2021-07-06 DIAGNOSIS — I5032 Chronic diastolic (congestive) heart failure: Secondary | ICD-10-CM | POA: Diagnosis present

## 2021-07-06 DIAGNOSIS — E7800 Pure hypercholesterolemia, unspecified: Secondary | ICD-10-CM | POA: Diagnosis present

## 2021-07-06 DIAGNOSIS — Z7984 Long term (current) use of oral hypoglycemic drugs: Secondary | ICD-10-CM

## 2021-07-06 DIAGNOSIS — Z880 Allergy status to penicillin: Secondary | ICD-10-CM

## 2021-07-06 DIAGNOSIS — I70262 Atherosclerosis of native arteries of extremities with gangrene, left leg: Secondary | ICD-10-CM | POA: Diagnosis not present

## 2021-07-06 DIAGNOSIS — I701 Atherosclerosis of renal artery: Secondary | ICD-10-CM | POA: Diagnosis not present

## 2021-07-06 DIAGNOSIS — M869 Osteomyelitis, unspecified: Secondary | ICD-10-CM | POA: Diagnosis present

## 2021-07-06 DIAGNOSIS — Z20822 Contact with and (suspected) exposure to covid-19: Secondary | ICD-10-CM | POA: Diagnosis present

## 2021-07-06 DIAGNOSIS — E872 Acidosis, unspecified: Secondary | ICD-10-CM

## 2021-07-06 DIAGNOSIS — M86172 Other acute osteomyelitis, left ankle and foot: Secondary | ICD-10-CM | POA: Diagnosis present

## 2021-07-06 DIAGNOSIS — Z9049 Acquired absence of other specified parts of digestive tract: Secondary | ICD-10-CM

## 2021-07-06 DIAGNOSIS — M1990 Unspecified osteoarthritis, unspecified site: Secondary | ICD-10-CM | POA: Diagnosis present

## 2021-07-06 DIAGNOSIS — M109 Gout, unspecified: Secondary | ICD-10-CM | POA: Diagnosis present

## 2021-07-06 DIAGNOSIS — Z87891 Personal history of nicotine dependence: Secondary | ICD-10-CM

## 2021-07-06 DIAGNOSIS — E1152 Type 2 diabetes mellitus with diabetic peripheral angiopathy with gangrene: Secondary | ICD-10-CM | POA: Diagnosis present

## 2021-07-06 DIAGNOSIS — E1149 Type 2 diabetes mellitus with other diabetic neurological complication: Secondary | ICD-10-CM | POA: Diagnosis present

## 2021-07-06 DIAGNOSIS — E11621 Type 2 diabetes mellitus with foot ulcer: Secondary | ICD-10-CM | POA: Diagnosis present

## 2021-07-06 DIAGNOSIS — N1831 Chronic kidney disease, stage 3a: Secondary | ICD-10-CM | POA: Diagnosis present

## 2021-07-06 DIAGNOSIS — Z66 Do not resuscitate: Secondary | ICD-10-CM | POA: Diagnosis present

## 2021-07-06 DIAGNOSIS — I7092 Chronic total occlusion of artery of the extremities: Secondary | ICD-10-CM | POA: Diagnosis not present

## 2021-07-06 DIAGNOSIS — I714 Abdominal aortic aneurysm, without rupture, unspecified: Secondary | ICD-10-CM | POA: Diagnosis present

## 2021-07-06 DIAGNOSIS — E1142 Type 2 diabetes mellitus with diabetic polyneuropathy: Secondary | ICD-10-CM | POA: Diagnosis present

## 2021-07-06 DIAGNOSIS — E213 Hyperparathyroidism, unspecified: Secondary | ICD-10-CM | POA: Diagnosis present

## 2021-07-06 DIAGNOSIS — D649 Anemia, unspecified: Secondary | ICD-10-CM

## 2021-07-06 DIAGNOSIS — I130 Hypertensive heart and chronic kidney disease with heart failure and stage 1 through stage 4 chronic kidney disease, or unspecified chronic kidney disease: Secondary | ICD-10-CM | POA: Diagnosis present

## 2021-07-06 DIAGNOSIS — E1159 Type 2 diabetes mellitus with other circulatory complications: Secondary | ICD-10-CM | POA: Diagnosis not present

## 2021-07-06 DIAGNOSIS — L97529 Non-pressure chronic ulcer of other part of left foot with unspecified severity: Secondary | ICD-10-CM | POA: Diagnosis present

## 2021-07-06 DIAGNOSIS — L03116 Cellulitis of left lower limb: Secondary | ICD-10-CM | POA: Diagnosis present

## 2021-07-06 DIAGNOSIS — D631 Anemia in chronic kidney disease: Secondary | ICD-10-CM | POA: Diagnosis present

## 2021-07-06 DIAGNOSIS — M2042 Other hammer toe(s) (acquired), left foot: Secondary | ICD-10-CM | POA: Diagnosis present

## 2021-07-06 DIAGNOSIS — M868X7 Other osteomyelitis, ankle and foot: Secondary | ICD-10-CM | POA: Diagnosis present

## 2021-07-06 DIAGNOSIS — Z794 Long term (current) use of insulin: Secondary | ICD-10-CM

## 2021-07-06 DIAGNOSIS — Z888 Allergy status to other drugs, medicaments and biological substances status: Secondary | ICD-10-CM

## 2021-07-06 DIAGNOSIS — K219 Gastro-esophageal reflux disease without esophagitis: Secondary | ICD-10-CM | POA: Insufficient documentation

## 2021-07-06 DIAGNOSIS — Z91011 Allergy to milk products: Secondary | ICD-10-CM

## 2021-07-06 DIAGNOSIS — I5033 Acute on chronic diastolic (congestive) heart failure: Secondary | ICD-10-CM | POA: Diagnosis present

## 2021-07-06 DIAGNOSIS — I739 Peripheral vascular disease, unspecified: Secondary | ICD-10-CM | POA: Diagnosis not present

## 2021-07-06 DIAGNOSIS — Z85828 Personal history of other malignant neoplasm of skin: Secondary | ICD-10-CM

## 2021-07-06 DIAGNOSIS — Z79899 Other long term (current) drug therapy: Secondary | ICD-10-CM

## 2021-07-06 DIAGNOSIS — M2012 Hallux valgus (acquired), left foot: Secondary | ICD-10-CM | POA: Diagnosis present

## 2021-07-06 DIAGNOSIS — I10 Essential (primary) hypertension: Secondary | ICD-10-CM | POA: Diagnosis present

## 2021-07-06 LAB — BASIC METABOLIC PANEL
Anion gap: 12 (ref 5–15)
BUN: 20 mg/dL (ref 8–23)
CO2: 26 mmol/L (ref 22–32)
Calcium: 8.9 mg/dL (ref 8.9–10.3)
Chloride: 99 mmol/L (ref 98–111)
Creatinine, Ser: 1.2 mg/dL — ABNORMAL HIGH (ref 0.44–1.00)
GFR, Estimated: 44 mL/min — ABNORMAL LOW (ref >60)
Glucose, Bld: 410 mg/dL — ABNORMAL HIGH (ref 70–99)
Potassium: 3.7 mmol/L (ref 3.5–5.1)
Sodium: 137 mmol/L (ref 135–145)

## 2021-07-06 LAB — LACTIC ACID, PLASMA
Lactic Acid, Venous: 2.8 mmol/L (ref 0.5–1.9)
Lactic Acid, Venous: 2.3 mmol/L (ref 0.5–1.9)

## 2021-07-06 LAB — RESP PANEL BY RT-PCR (FLU A&B, COVID) ARPGX2
Influenza A by PCR: NEGATIVE
Influenza B by PCR: NEGATIVE
SARS Coronavirus 2 by RT PCR: NEGATIVE

## 2021-07-06 LAB — CBC
HCT: 36.8 % (ref 36.0–46.0)
Hemoglobin: 11.4 g/dL — ABNORMAL LOW (ref 12.0–15.0)
MCH: 29 pg (ref 26.0–34.0)
MCHC: 31 g/dL (ref 30.0–36.0)
MCV: 93.6 fL (ref 80.0–100.0)
Platelets: 193 10*3/uL (ref 150–400)
RBC: 3.93 MIL/uL (ref 3.87–5.11)
RDW: 12.6 % (ref 11.5–15.5)
WBC: 7.8 10*3/uL (ref 4.0–10.5)
nRBC: 0 % (ref 0.0–0.2)

## 2021-07-06 LAB — CBG MONITORING, ED: Glucose-Capillary: 269 mg/dL — ABNORMAL HIGH (ref 70–99)

## 2021-07-06 MED ORDER — ONDANSETRON HCL 4 MG PO TABS: 4.0000 mg | ORAL_TABLET | Freq: Four times a day (QID) | ORAL | Status: DC | PRN

## 2021-07-06 MED ORDER — ACETAMINOPHEN 650 MG RE SUPP: 650.0000 mg | Freq: Four times a day (QID) | RECTAL | Status: DC | PRN

## 2021-07-06 MED ORDER — HYDROCODONE-ACETAMINOPHEN 5-325 MG PO TABS
1.0000 | ORAL_TABLET | ORAL | Status: DC | PRN
  Filled 2021-07-06 (×2): qty 1

## 2021-07-06 MED ORDER — VANCOMYCIN HCL 1500 MG/300ML IV SOLN
1500.0000 mg | Freq: Once | INTRAVENOUS | Status: AC
  Administered 2021-07-06: 1500 mg via INTRAVENOUS
  Filled 2021-07-06: qty 300

## 2021-07-06 MED ORDER — PIPERACILLIN-TAZOBACTAM 3.375 G IVPB 30 MIN
3.3750 g | Freq: Once | INTRAVENOUS | Status: AC
  Administered 2021-07-06: 3.375 g via INTRAVENOUS
  Filled 2021-07-06: qty 50

## 2021-07-06 MED ORDER — LATANOPROST 0.005 % OP SOLN
1.0000 [drp] | Freq: Every evening | OPHTHALMIC | Status: DC
  Administered 2021-07-08 – 2021-07-10 (×2): 1 [drp] via OPHTHALMIC
  Filled 2021-07-06 (×2): qty 2.5

## 2021-07-06 MED ORDER — CARVEDILOL 3.125 MG PO TABS
6.2500 mg | ORAL_TABLET | Freq: Two times a day (BID) | ORAL | Status: DC
  Administered 2021-07-07 – 2021-07-11 (×8): 6.25 mg via ORAL
  Filled 2021-07-06: qty 2
  Filled 2021-07-06: qty 1
  Filled 2021-07-06 (×3): qty 2
  Filled 2021-07-06: qty 1
  Filled 2021-07-06 (×2): qty 2

## 2021-07-06 MED ORDER — ACETAMINOPHEN 325 MG PO TABS: 650.0000 mg | ORAL_TABLET | Freq: Four times a day (QID) | ORAL | Status: DC | PRN

## 2021-07-06 MED ORDER — ONDANSETRON HCL 4 MG/2ML IJ SOLN: 4.0000 mg | Freq: Four times a day (QID) | INTRAMUSCULAR | Status: DC | PRN

## 2021-07-06 MED ORDER — LACTATED RINGERS IV BOLUS
1000.0000 mL | Freq: Once | INTRAVENOUS | Status: AC
  Administered 2021-07-06: 1000 mL via INTRAVENOUS

## 2021-07-06 MED ORDER — INSULIN ASPART 100 UNIT/ML IJ SOLN
0.0000 [IU] | INTRAMUSCULAR | Status: DC
  Administered 2021-07-07: 3 [IU] via SUBCUTANEOUS
  Administered 2021-07-07: 8 [IU] via SUBCUTANEOUS
  Administered 2021-07-07: 5 [IU] via SUBCUTANEOUS
  Administered 2021-07-07: 3 [IU] via SUBCUTANEOUS
  Administered 2021-07-07: 2 [IU] via SUBCUTANEOUS
  Administered 2021-07-08: 3 [IU] via SUBCUTANEOUS
  Filled 2021-07-06 (×6): qty 1

## 2021-07-06 NOTE — Assessment & Plan Note (Signed)
Controlled.  Continue carvedilol ?

## 2021-07-06 NOTE — ED Provider Triage Note (Signed)
Emergency Medicine Provider Triage Evaluation Note ? ?Andrea Lawson, a 88 y.o. female  was evaluated in triage.  Pt complains of left foot infection and osteomyelitis.  Patient presents from her facility at [facility], at the advice of her podiatrist.  She is evaluated by [facility] podiatry with x-ray today concerning for osteomyelitis.  Patient was started on Levaquin for the cellulitis, presents to the ED for IV antibiotics and admission. Patient with a history of CHF, CKD, HTN, neuropathy, and thrombocytopenia ? ?Review of Systems  ?Positive: Left foot infection ?Negative: FCS ? ?Physical Exam  ?BP 120/85 (BP Location: Left Arm)   Pulse 92   Temp 98.8 ?F (37.1 ?C) (Oral)   Resp 18   Wt 68.5 kg   SpO2 92%   BMI 29.49 kg/m?  ?Gen:   Awake, no distress   ?Resp:  Normal effort  ?MSK:   Moves extremities without difficulty Left foot with bandage and post-op shoe in place ?Other:   ?Medical Decision Making  ?Medically screening exam initiated at 7:28 PM.  Appropriate orders placed.  Andrea Lawson was informed that the remainder of the evaluation will be completed by another provider, this initial triage assessment does not replace that evaluation, and the importance of remaining in the ED until their evaluation is complete. ? ?Reactive patient to the ED for evaluation of left foot infection concerning for osteomyelitis.  Patient presents at the request of her podiatrist, who expects IV antibiotics and possible surgery. ?  ?Melvenia Needles, PA-C ?07/06/21 1942 ? ?

## 2021-07-06 NOTE — Assessment & Plan Note (Addendum)
-   s/p IVF with good response ?

## 2021-07-06 NOTE — Assessment & Plan Note (Addendum)
-   Baseline approximately 10 to 11 g/dL ?- Currently at baseline ?

## 2021-07-06 NOTE — H&P (Addendum)
?History and Physical  ? ? ?Patient: Andrea Lawson DOB: 1932-01-09 ?DOA: 07/06/2021 ?DOS: the patient was seen and examined on 07/06/2021 ?PCP: Venia Carbon, MD  ?Patient coming from: Home ? ?Chief Complaint:  ?Chief Complaint  ?Patient presents with  ? Foot Pain  ? ? ?HPI: Andrea Lawson is a 88 y.o. female with medical history significant for DM with diabetic polyneuropathy, HTN, diastolic CHF, chronic anemia, CKD stage IIIa, chronic diabetic ulcer left second toe, who was sent in by her podiatrist for IV antibiotics for osteomyelitis of the left second toe with likelihood of requiring surgery/amputation. ?ED course: On arrival, vitals within normal limits.  WBC 7800 with lactic acid 2.3.  Labs otherwise at baseline.  Foot x-ray showed findings suspicious for acute osteomyelitis involving the distal phalanx of the second left toe.  Patient started on Zosyn and vancomycin and given an IV fluid bolus.  Hospitalist consulted for admission.  ? ?Review of Systems: As mentioned in the history of present illness. All other systems reviewed and are negative. ?Past Medical History:  ?Diagnosis Date  ? AAA (abdominal aortic aneurysm)   ? Allergic state   ? Anemia   ? IRON DEF.ANEMIA  ? Arthritis   ? OSTEOARTHRITIS OF BIL.KNEES  ? B12 deficiency   ? Basal cell carcinoma   ? Callus of foot   ? bil.  ? Cancer ([place])   ? of CECUM  ? Cataract cortical, senile   ? RIGHT EYE  ? Cholesterol-lowering agent myopathy 2004  ? Chronic diastolic heart failure ([place])   ? Chronic kidney disease, stage 3a ([place])   ? Diabetes mellitus without complication ([place])   ? Diabetic neuropathy ([place])   ? Diverticulosis   ? Drug-induced myopathy   ? Eczema   ? GERD (gastroesophageal reflux disease)   ? Gout   ? Hemorrhoids   ? History of chicken pox   ? History of hypercalcemia   ? Hypercholesterolemia   ? Hyperparathyroidism ([place])   ? Hypertension   ? Hypothyroidism   ? Incontinent of urine   ? Lumbago   ? Mycotic toenails   ? Neuropathy due  to secondary diabetes mellitus ([place])   ? Wet senile macular degeneration ([place])   ? ?Past Surgical History:  ?Procedure Laterality Date  ? BUNIONECTOMY  2006  ? COLONOSCOPY    ? COLONOSCOPY WITH PROPOFOL N/A 12/09/2014  ? Procedure: COLONOSCOPY WITH PROPOFOL;  Surgeon: Lollie Sails, MD;  Location: [facility] ENDOSCOPY;  Service: Endoscopy;  Laterality: N/A;  ? COLONOSCOPY WITH PROPOFOL N/A 05/03/2017  ? Procedure: COLONOSCOPY WITH PROPOFOL;  Surgeon: Lollie Sails, MD;  Location: [facility] ENDOSCOPY;  Service: Endoscopy;  Laterality: N/A;  ? DEBRIDEMENT OF CALLUSES Bilateral   ? FEET  ? DIAGNOSTIC LAPAROSCOPY    ? ESOPHAGOGASTRODUODENOSCOPY    ? EYE SURGERY    ? GOUT SURGERY    ? KNEE ARTHROSCOPY    ? LAPAROSCOPIC PARTIAL COLECTOMY Right 03/16/2010  ? MOHS SURGERY  12/2008  ? MOUTH SURGERY    ? PARATHYROIDECTOMY  2001  ? TONSILLECTOMY    ? ?Social History:  reports that she has quit smoking. She has never used smokeless tobacco. She reports current alcohol use. She reports that she does not use drugs. ? ?Allergies  ?Allergen Reactions  ? Simvastatin   ?  Other reaction(s): Other (See Comments) ?Other Reaction: Myositis  ? Ace Inhibitors   ?  Other reaction(s): Cough  ? Angiotensin Receptor Blockers Cough  ?  Other  reaction(s): Cough  ? Lactose Other (See Comments)  ?  intolerant  ? Losartan   ?  Other reaction(s): Unknown  ? Penicillins Rash  ?  Other reaction(s): Other (See Comments) ?Other Reaction: ALLERGY ?  ? ? ?No family history on file. ? ?Prior to Admission medications   ?Medication Sig Start Date End Date Taking? Authorizing Provider  ?carvedilol (COREG) 6.25 MG tablet TAKE ONE TABLET BY MOUTH TWICE DAILY 10/30/20  Yes Venia Carbon, MD  ?cholecalciferol (VITAMIN D) 400 UNITS TABS tablet Take 1,000 Units by mouth.   Yes [provider]  ?Cyanocobalamin (VITAMIN B 12 PO) Take 1,000 mcg by mouth daily.   Yes [provider]  ?furosemide (LASIX) 20 MG tablet TAKE ONE TABLET BY MOUTH EVERY DAY  06/08/21  Yes Venia Carbon, MD  ?insulin degludec (TRESIBA) 100 UNIT/ML FlexTouch Pen Inject 5 Units into the skin daily at 10 pm. 10/23/20  Yes Venia Carbon, MD  ?latanoprost (XALATAN) 0.005 % ophthalmic solution Place 1 drop into both eyes every evening. 10/23/20  Yes Venia Carbon, MD  ?metFORMIN (GLUCOPHAGE) 500 MG tablet TAKE ONE TABLET BY MOUTH TWICE DAILY 07/01/21  Yes Venia Carbon, MD  ?[facility] powder Apply topically. 06/11/21  Yes [provider]  ?potassium chloride (KLOR-CON) 10 MEQ tablet TAKE ONE TABLET BY MOUTH TWICE DAILY 02/09/21  Yes Viviana Simpler I, MD  ?glucose blood (ONETOUCH ULTRA) test strip Use to check blood sugar once daily ?Patient not taking: Reported on 05/29/2021 05/22/21   Viviana Simpler I, MD  ?Insulin Pen Needle 32G X 4 MM MISC 1 Dose by Does not apply route daily. ?Patient not taking: Reported on 05/29/2021 10/27/20   Venia Carbon, MD  ? ? ?Physical Exam: ?Vitals:  ? 07/06/21 1917 07/06/21 1920 07/06/21 2200 07/06/21 2245  ?BP: 120/85  (!) 143/77 135/78  ?Pulse: 92  77 80  ?Resp: '18  15 16  '$ ?Temp: 98.8 ?F (37.1 ?C)  98.4 ?F (36.9 ?C)   ?TempSrc: Oral  Oral   ?SpO2: 92%  97% 98%  ?Weight:  68.5 kg    ? ?Physical Exam ?Vitals and nursing note reviewed.  ?Constitutional:   ?   General: She is not in acute distress. ?HENT:  ?   Head: Normocephalic and atraumatic.  ?Cardiovascular:  ?   Rate and Rhythm: Normal rate and regular rhythm.  ?   Pulses: Normal pulses.  ?   Heart sounds: Normal heart sounds.  ?Pulmonary:  ?   Effort: Pulmonary effort is normal.  ?   Breath sounds: Normal breath sounds.  ?Abdominal:  ?   Palpations: Abdomen is soft.  ?   Tenderness: There is no abdominal tenderness.  ?Musculoskeletal:  ?   Comments: Swellin and erythema left lower extremity extending up to mid shin.  Ulcer distal phalanx left second toe with purulent drainage.  See picture below  ?Neurological:  ?   Mental Status: Mental status is at baseline.  ? ? ? ? ?Data  Reviewed: ?Relevant notes from primary care and specialist visits, past discharge summaries as available in EHR, including Care Everywhere. ?Prior diagnostic testing as pertinent to current admission diagnoses ?Updated medications and problem lists for reconciliation ?ED course, including vitals, labs, imaging, treatment and response to treatment ?Triage notes, nursing and pharmacy notes and ED provider's notes ?Notable results as noted in HPI ? ? ?Assessment and Plan: ?* Osteomyelitis of second toe of left foot ([place]) ?Continue Zosyn and vancomycin ?Podiatry consult ?We will  keep patient n.p.o. in case of decision to take to the OR in the a.m. ? ?Lactic acidosis ?Isolated elevation without other sepsis criteria ?Patient received an IV fluid bolus in the ED so we will trend ? ?Chronic anemia ?At baseline at 11.4 ? ?HTN (hypertension) ?Controlled.  Continue carvedilol ? ?Type 2 diabetes mellitus with other circulatory complications ([place]) ?Sliding scale insulin coverage ? ?Chronic kidney disease, stage 3a ([place]) ?At baseline ? ?Chronic diastolic heart failure ([place]) ?Compensated ?Continue carvedilol and furosemide ? ? ? ?Advance Care Planning:   Code Status: Prior DNR ? ?Consults: [facility] podiatry, Dr. Cleda Mccreedy ? ?Family Communication: none ? ?Severity of Illness: ?The appropriate patient status for this patient is INPATIENT. Inpatient status is judged to be reasonable and necessary in order to provide the required intensity of service to ensure the patient's safety. The patient's presenting symptoms, physical exam findings, and initial radiographic and laboratory data in the context of their chronic comorbidities is felt to place them at high risk for further clinical deterioration. Furthermore, it is not anticipated that the patient will be medically stable for discharge from the hospital within 2 midnights of admission.  ? ?* I certify that at the point of admission it is my clinical judgment that the patient will require  inpatient hospital care spanning beyond 2 midnights from the point of admission due to high intensity of service, high risk for further deterioration and high frequency of surveillance required.* ? ?Author: ?Waldemar Dickens

## 2021-07-06 NOTE — Assessment & Plan Note (Addendum)
-   no s/s exacerbation  ?- Continue carvedilol ?

## 2021-07-06 NOTE — ED Triage Notes (Signed)
Pt presents via POV c/o left foot "infection". Sent to ED from podiatry for IV abx and possible surgery to left foot per pt. Pt has PMH DMII.  ?

## 2021-07-06 NOTE — Assessment & Plan Note (Addendum)
-   A1c 10% ?- resume basal insulin; will adjust as needed ?-Metformin on hold during hospitalization, but resumed at discharge; low CrCl but GFR seems appropriate (>60) ?- continue SSI and CBG monitoring  ?- greatly appreciate diabetes coordinator note; agree that ALF needs to check CBG at least daily and also administer insulin or at least observe/monitor/educate patient on proper administration  ?-Discharging to SNF for now ?

## 2021-07-06 NOTE — Assessment & Plan Note (Addendum)
-   Continued on vancomycin; d/c'd zosyn (given CKD3, would like to avoid increased risk of renal toxicity with vanc/zosyn combo); changed zosyn to rocephin and flagyl ?- informed by Dr. Silvio Pate on 3/30 that outpt culture noted with E. Faecalis (sens to amp and vanc).  ?-Discussed with pharmacy, patient changed to amoxicillin to complete course ?-Left lower extremity angiogram with vascular surgery on 07/09/2021 was also unremarkable with no intervention required ?-Underwent amputation with podiatry on 07/09/2021 ?

## 2021-07-06 NOTE — ED Provider Notes (Signed)
? ?[facility] ?Provider Note ? ? ? Event Date/Time  ? First MD Initiated Contact with Patient 07/06/21 2236   ?  (approximate) ? ? ?History  ? ?Foot Pain ? ? ?HPI ? ?Andrea Lawson is a 88 y.o. female who presents to the ED for evaluation of Foot Pain ?  ?I reviewed outpatient podiatry visit from this afternoon the patient was seen by Dr. Vickki Muff and urged to come to the ED for IV antibiotics and admission for osteomyelitis.  X-rays were performed, reportedly with signs of osteo, but I cannot visualize these x-rays or see any official reads in the computer. ? ?Patient presents to the ED at the direction of her podiatrist.  Reports feeling okay and has minimal pain to the left foot and toes 2 and 3, but largely does not feel anything.  Denies any fevers or systemic symptoms. ? ? ?Physical Exam  ? ?Triage Vital Signs: ?ED Triage Vitals  ?Enc Vitals Group  ?   BP 07/06/21 1917 120/85  ?   Pulse Rate 07/06/21 1917 92  ?   Resp 07/06/21 1917 18  ?   Temp 07/06/21 1917 98.8 ?F (37.1 ?C)  ?   Temp Source 07/06/21 1917 Oral  ?   SpO2 07/06/21 1917 92 %  ?   Weight 07/06/21 1920 151 lb (68.5 kg)  ?   Height --   ?   Head Circumference --   ?   Peak Flow --   ?   Pain Score 07/06/21 1919 0  ?   Pain Loc --   ?   Pain Edu? --   ?   Excl. in [place]? --   ? ? ?Most recent vital signs: ?Vitals:  ? 07/06/21 2200 07/06/21 2245  ?BP: (!) 143/77 135/78  ?Pulse: 77 80  ?Resp: 15 16  ?Temp: 98.4 ?F (36.9 ?C)   ?SpO2: 97% 98%  ? ? ?General: Awake, no distress.  ?CV:  Good peripheral perfusion.  ?Resp:  Normal effort.  ?Abd:  No distention.  ?MSK:  Ulcerative toes and peeling skin to toes 2 and 3 on the left foot.  Left ankle and lower leg are somewhat swollen compared to the right with erythema, warmth.  No fluctuance.  No crepitus. ?Neuro:  No focal deficits appreciated. ?Other:   ? ? ?ED Results / Procedures / Treatments  ? ?Labs ?(all labs ordered are listed, but only abnormal results are displayed) ?Labs Reviewed   ?CBC - Abnormal; Notable for the following components:  ?    Result Value  ? Hemoglobin 11.4 (*)   ? All other components within normal limits  ?BASIC METABOLIC PANEL - Abnormal; Notable for the following components:  ? Glucose, Bld 410 (*)   ? Creatinine, Ser 1.20 (*)   ? GFR, Estimated 44 (*)   ? All other components within normal limits  ?LACTIC ACID, PLASMA - Abnormal; Notable for the following components:  ? Lactic Acid, Venous 2.8 (*)   ? All other components within normal limits  ?LACTIC ACID, PLASMA - Abnormal; Notable for the following components:  ? Lactic Acid, Venous 2.3 (*)   ? All other components within normal limits  ?CBG MONITORING, ED - Abnormal; Notable for the following components:  ? Glucose-Capillary 269 (*)   ? All other components within normal limits  ?RESP PANEL BY RT-PCR (FLU A&B, COVID) [id number]  ?CULTURE, BLOOD (ROUTINE X 2)  ?CULTURE, BLOOD (ROUTINE X 2)  ? ? ?EKG ? ? ?  RADIOLOGY ?Plain film of the left foot reviewed by me with evidence of osteomyelitis of the left toe ? ?Official radiology report(s): ?DG Foot Complete Left ? ?Result Date: 07/06/2021 ?CLINICAL DATA:  Evaluation for osteomyelitis of the second and third left toes. EXAM: LEFT FOOT - COMPLETE 3+ VIEW COMPARISON:  None. FINDINGS: There is no evidence of an fracture or dislocation. Cortical destruction is seen along the distal phalanx of the second left toe. Marked severity diffuse soft tissue swelling of the second left toe is also noted. IMPRESSION: Findings suspicious for acute osteomyelitis involving the distal phalanx of the second left toe. Electronically Signed   By: Virgina Norfolk M.D.   On: 07/06/2021 23:14   ? ?PROCEDURES and INTERVENTIONS: ? ?Procedures ? ?Medications  ?vancomycin (VANCOREADY) IVPB 1500 mg/300 mL (1,500 mg Intravenous New Bag/Given 07/06/21 2307)  ?piperacillin-tazobactam (ZOSYN) IVPB 3.375 g (3.375 g Intravenous New Bag/Given 07/06/21 2307)  ?lactated ringers bolus 1,000 mL (has no administration  in time range)  ? ? ? ?IMPRESSION / MDM / ASSESSMENT AND PLAN / ED COURSE  ?I reviewed the triage vital signs and the nursing notes. ? ?Diabetic patient presents to the ED for admission in the setting of outpatient diagnosed osteomyelitis.  She looks systemically well.  Does have some evidence of a sending cellulitis to her left ankle and calf without crepitus or fluctuance to suggest abscess or deeper and aggressive infection.  Blood work with no leukocytosis.  Hyperglycemia is noted, but no acidosis to suggest DKA.  Lactic acidosis is present and downtrending.  X-ray repeated as we cannot visualize outpatient films, with evidence of osteomyelitis.  We will start antibiotics, fluids and consult with medicine for admission. ? ?Clinical Course as of 07/06/21 2322  ?Mon Jul 06, 2021  ?2322 I consult with hospitalist who agrees to admit. [DS]  ?  ?Clinical Course User Index ?[DS] Vladimir Crofts, MD  ? ? ? ?FINAL CLINICAL IMPRESSION(S) / ED DIAGNOSES  ? ?Final diagnoses:  ?Other osteomyelitis of left foot ([place])  ? ? ? ?Rx / DC Orders  ? ?ED Discharge Orders   ? ? None  ? ?  ? ? ? ?Note:  This document was prepared using Dragon voice recognition software and may include unintentional dictation errors. ?  ?Vladimir Crofts, MD ?07/06/21 2333 ? ?

## 2021-07-06 NOTE — Assessment & Plan Note (Addendum)
-  patient has history of CKD3a. Baseline creat ~ 1.2, eGFR 50 ? ?

## 2021-07-07 ENCOUNTER — Encounter
Admission: EM | Disposition: A | Discharge status: Skilled Nursing Facility | Payer: Self-pay | Source: Home / Self Care | Attending: Internal Medicine

## 2021-07-07 DIAGNOSIS — E1159 Type 2 diabetes mellitus with other circulatory complications: Secondary | ICD-10-CM

## 2021-07-07 DIAGNOSIS — I739 Peripheral vascular disease, unspecified: Secondary | ICD-10-CM

## 2021-07-07 LAB — GLUCOSE, CAPILLARY
Glucose-Capillary: 267 mg/dL — ABNORMAL HIGH (ref 70–99)
Glucose-Capillary: 153 mg/dL — ABNORMAL HIGH (ref 70–99)

## 2021-07-07 LAB — CBG MONITORING, ED
Glucose-Capillary: 103 mg/dL — ABNORMAL HIGH (ref 70–99)
Glucose-Capillary: 133 mg/dL — ABNORMAL HIGH (ref 70–99)
Glucose-Capillary: 112 mg/dL — ABNORMAL HIGH (ref 70–99)
Glucose-Capillary: 152 mg/dL — ABNORMAL HIGH (ref 70–99)
Glucose-Capillary: 207 mg/dL — ABNORMAL HIGH (ref 70–99)

## 2021-07-07 LAB — HEMOGLOBIN A1C
Hgb A1c MFr Bld: 10 % — ABNORMAL HIGH (ref 4.8–5.6)
Mean Plasma Glucose: 240.3 mg/dL

## 2021-07-07 LAB — BUN: BUN: 15 mg/dL (ref 8–23)

## 2021-07-07 LAB — SEDIMENTATION RATE: Sed Rate: 63 mm/hr — ABNORMAL HIGH (ref 0–30)

## 2021-07-07 LAB — CREATININE, SERUM
Creatinine, Ser: 1.01 mg/dL — ABNORMAL HIGH (ref 0.44–1.00)
GFR, Estimated: 54 mL/min — ABNORMAL LOW (ref >60)

## 2021-07-07 LAB — PREALBUMIN: Prealbumin: 8.7 mg/dL — ABNORMAL LOW (ref 18–38)

## 2021-07-07 LAB — C-REACTIVE PROTEIN: CRP: 5.1 mg/dL — ABNORMAL HIGH (ref <1.0)

## 2021-07-07 SURGERY — AMPUTATION, TOE
Anesthesia: Choice | Site: Toe | Laterality: Left

## 2021-07-07 MED ORDER — SODIUM CHLORIDE 0.9 % IV SOLN
2.0000 g | INTRAVENOUS | Status: DC
  Administered 2021-07-08 – 2021-07-09 (×3): 2 g via INTRAVENOUS
  Filled 2021-07-07: qty 20
  Filled 2021-07-07 (×3): qty 2

## 2021-07-07 MED ORDER — INSULIN GLARGINE-YFGN 100 UNIT/ML ~~LOC~~ SOLN
5.0000 [IU] | Freq: Every day | SUBCUTANEOUS | Status: DC
  Administered 2021-07-07 – 2021-07-10 (×4): 5 [IU] via SUBCUTANEOUS
  Filled 2021-07-07 (×5): qty 0.05

## 2021-07-07 MED ORDER — VANCOMYCIN HCL 1250 MG/250ML IV SOLN: 1250.0000 mg | INTRAVENOUS | Status: DC

## 2021-07-07 MED ORDER — METRONIDAZOLE 500 MG/100ML IV SOLN
500.0000 mg | Freq: Two times a day (BID) | INTRAVENOUS | Status: DC
  Administered 2021-07-07 – 2021-07-10 (×6): 500 mg via INTRAVENOUS
  Filled 2021-07-07 (×6): qty 100

## 2021-07-07 MED ORDER — VANCOMYCIN HCL 750 MG/150ML IV SOLN
750.0000 mg | INTRAVENOUS | Status: DC
  Administered 2021-07-07 – 2021-07-10 (×3): 750 mg via INTRAVENOUS
  Filled 2021-07-07 (×3): qty 150

## 2021-07-07 MED ORDER — SODIUM CHLORIDE 0.9 % IV SOLN: INTRAVENOUS | Status: DC

## 2021-07-07 MED ORDER — ENOXAPARIN SODIUM 40 MG/0.4ML IJ SOSY
40.0000 mg | PREFILLED_SYRINGE | Freq: Every day | INTRAMUSCULAR | Status: DC
  Administered 2021-07-07 – 2021-07-10 (×4): 40 mg via SUBCUTANEOUS
  Filled 2021-07-07 (×4): qty 0.4

## 2021-07-07 MED ORDER — PIPERACILLIN-TAZOBACTAM 3.375 G IVPB
3.3750 g | Freq: Three times a day (TID) | INTRAVENOUS | Status: DC
  Administered 2021-07-07 (×2): 3.375 g via INTRAVENOUS
  Filled 2021-07-07 (×2): qty 50

## 2021-07-07 NOTE — Progress Notes (Signed)
Pharmacy Antibiotic Note ? ?Andrea Lawson is a 88 y.o. female admitted on 07/06/2021 with  osteomyelitis .  Pharmacy has been consulted for Vanc, Zosyn dosing. ? ?Plan: ?Zosyn 3.375 gm IV X 1 given in ED on 3/27 @ 2307. ?Zosyn 3.375 gm IV Q8H ED ordered to continue on 3/28 @ 0500. ? ?Vancomycin 1500 mg IV X 1 given on 3/27 @ 2307. ?Vancomycin 1250 mg IV Q48H ordered to start on 3/29 @ 2300. ? ?AUC = 529.9 ?Vanc trough = 12.2  ? ?Height: 5' (152.4 cm) ?Weight: 68 kg (149 lb 14.6 oz) ?IBW/kg (Calculated) : 45.5 ? ?Temp (24hrs), Avg:98.6 ?F (37 ?C), Min:98.4 ?F (36.9 ?C), Max:98.8 ?F (37.1 ?C) ? ?Recent Labs  ?Lab 07/06/21 ?1947 07/06/21 ?2201  ?WBC 7.8  --   ?CREATININE 1.20*  --   ?LATICACIDVEN 2.8* 2.3*  ?  ?Estimated Creatinine Clearance: 28.4 mL/min (A) (by C-G formula based on SCr of 1.2 mg/dL (H)).   ? ?Allergies  ?Allergen Reactions  ? Simvastatin   ?  Other reaction(s): Other (See Comments) ?Other Reaction: Myositis  ? Ace Inhibitors   ?  Other reaction(s): Cough  ? Angiotensin Receptor Blockers Cough  ?  Other reaction(s): Cough  ? Lactose Other (See Comments)  ?  intolerant  ? Losartan   ?  Other reaction(s): Unknown  ? Penicillins Rash  ?  Other reaction(s): Other (See Comments) ?Other Reaction: ALLERGY ?  ? ? ?Antimicrobials this admission: ?  >>  ?  >>  ? ?Dose adjustments this admission: ? ? ?Microbiology results: ? BCx:  ? UCx:   ? Sputum:   ? MRSA PCR:  ? ?Thank you for allowing pharmacy to be a part of this patient?s care. ? ?Andrea Lawson D ?07/07/2021 1:04 AM ? ?

## 2021-07-07 NOTE — Progress Notes (Addendum)
Inpatient Diabetes Program Recommendations ? ?AACE/ADA: New Consensus Statement on Inpatient Glycemic Control ? ?Target Ranges:  Prepandial:   less than 140 mg/dL ?     Peak postprandial:   less than 180 mg/dL (1-2 hours) ?     Critically ill patients:  140 - 180 mg/dL  ? ? Latest Reference Range & Units 07/06/21 22:24 07/07/21 04:58 07/07/21 07:30  ?Glucose-Capillary 70 - 99 mg/dL 269 (H) 103 (H) 133 (H)  ? ? Latest Reference Range & Units 07/06/21 19:47  ?Glucose 70 - 99 mg/dL 410 (H)  ? ? Latest Reference Range & Units 09/01/20 00:00 07/07/21 00:26  ?Hemoglobin A1C 4.8 - 5.6 % 7.0 (E) 10.0 (H)  ? ?Review of Glycemic Control ? ?Diabetes history: DM2 ?Outpatient Diabetes medications: Tresiba 5 units QHS, Metformin 500 mg BID ?Current orders for Inpatient glycemic control: Novolog 0-15 units Q4H ? ?Inpatient Diabetes Program Recommendations:   ? ?HbgA1C:  A1C 10.0% on 07/07/21 indicating an average glucose of 240 mg/dl over the past 2-3 months. Patient has been administer insulin to herself in her shoulder. Would recommend that facility check glucose at least daily and have nursing staff administer insulin injections (or have CNA encourage patient to use appropriate location for insulin injections (abdomen, back of arm, thigh, or hip area). ? ?NOTE: Patient admitted with osteomyelitis of left 2nd toe. Initial lab glucose 410 mg/dl on 3/27. Patient has received Novolog correction Q4H and glucose 133 mg/dl this morning.  ? ?Addendum 07/07/21'@12'$ :30-Spoke with patient at bedside. Patient reports that she lives at [facility]. She states that she can not recall the name of the insulin or how much she takes but states that the staff at the ALF bring her medications to her and she gives herself her insulin shot and takes her oral DM medication. Patient reported that she gives herself insulin in her shoulder. Discussed using abdomen, thigh, or back of arm for injections (not shoulder).  Patient reports that  her glucose is not checked very often; usually just on Friday morning at 6am. Patient reports that her glucose is mid 100-low 200's mg/dl when checked on Friday morning, depending on what she has been eating; reports she likes to eat pie so glucose is higher at times when she eats pie. Inquired about A1C and patient states it is usually ok. Discussed last A1C in chart was 7% on 09/01/20 and now it is up to 10% on 07/07/21 indicating an average glucose of 240 mg/dl. Discussed glucose and A1C goals. Informed patient that I would reach out to [facility] to talk with nursing staff regarding her DM medications. Encouraged patient to allow nursing staff at [facility] to give her the insulin injections and would recommend they check glucose at least daily to get DM under better control, especially since she has osteomyelitis and will likely need toe amputation. Patient verbalized understanding and has no questions at this time. Called [facility] and left message on Nurse Coordinator Collier Salina) voicemail requesting return call. ? ?Addendum 07/07/21'@12'$ :55-Received return call from [facility] Catering manager) at [facility]). He reports that patient is ordered [place] 5 units QHS and Metformin 500 mg BID for DM control. He states that the CNA at the facility brings in medications to patient and the patient takes them themselves (as they are not nurses and can not administer the insulin for her).  Patient's glucose is being checked on Tuesday and Friday each week and the following were glucose values over  the past few weeks: 204, 164, 201, 212, 194, 158, 206, and 234 mg/dl.  Discussed that patient reported she is administering insulin in her shoulder and that patient should be using abdomen, back of arm, thigh, or hip for injections. Discussed increase in A1C from 7% in May 2022 to 10% on 07/07/21 indicating an average glucose of 240 mg/dl.   Collier Salina states that patient will likely be discharging to [facility] (skilled nursing) and if so then nursing staff could administer all medications. Informed Collier Salina it would be recommended that glucose be checked more often and nursing staff give patient insulin injections. Collier Salina verbalized understanding of information discussed.  ? ?Thanks, ?Barnie Alderman, RN, MSN, CDE ?Diabetes Coordinator ?Inpatient Diabetes Program ?[phone] (Team Pager from 8am to 5pm) ? ? ?

## 2021-07-07 NOTE — Consult Note (Signed)
?[facility] ?Vascular Consult Note ? ?MRN : [id number] ? ?Andrea Lawson is a 88 y.o. (Jul 29, 1931) female who presents with chief complaint of  ?Chief Complaint  ?Patient presents with  ? Foot Pain  ?. ? ?History of Present Illness: Andrea Lawson is a 88 year old female that we are asked to consult on by Dr. Cleda Mccreedy for slow healing chronic ulcerations.  She has been eating outpatient care by Dr. Vickki Muff but there is a sudden worsening of the wound on the left second toe which prompted emergent attention.  It was ultimately found that there is osteomyelitis present in the second toe.  Currently there are plans for an upcoming amputation of the second toe. ? ?Current Facility-Administered Medications  ?Medication Dose Route Frequency Provider Last Rate Last Admin  ? acetaminophen (TYLENOL) tablet 650 mg  650 mg Oral Q6H PRN Athena Masse, MD      ? Or  ? acetaminophen (TYLENOL) suppository 650 mg  650 mg Rectal Q6H PRN Athena Masse, MD      ? carvedilol (COREG) tablet 6.25 mg  6.25 mg Oral BID WC Athena Masse, MD   6.25 mg at 07/07/21 1700  ? cefTRIAXone (ROCEPHIN) 2 g in sodium chloride 0.9 % 100 mL IVPB  2 g Intravenous Q24H Dwyane Dee, MD      ? enoxaparin (LOVENOX) injection 40 mg  40 mg Subcutaneous Daily Dwyane Dee, MD      ? HYDROcodone-acetaminophen (NORCO/VICODIN) 5-325 MG per tablet 1-2 tablet  1-2 tablet Oral Q4H PRN Athena Masse, MD      ? insulin aspart (novoLOG) injection 0-15 Units  0-15 Units Subcutaneous Q4H Athena Masse, MD   5 Units at 07/07/21 1701  ? insulin glargine-yfgn (SEMGLEE) injection 5 Units  5 Units Subcutaneous QHS Dwyane Dee, MD      ? latanoprost (XALATAN) 0.005 % ophthalmic solution 1 drop  1 drop Both Eyes QPM Athena Masse, MD      ? metroNIDAZOLE (FLAGYL) IVPB 500 mg  500 mg Intravenous Q12H Dwyane Dee, MD      ? ondansetron [facility]) tablet 4 mg  4 mg Oral Q6H PRN Athena Masse, MD      ? Or  ? ondansetron [facility]) injection 4 mg   4 mg Intravenous Q6H PRN Athena Masse, MD      ? vancomycin (VANCOREADY) IVPB 750 mg/150 mL  750 mg Intravenous Q24H Virl Cagey E, RPH      ? ? ?Past Medical History:  ?Diagnosis Date  ? AAA (abdominal aortic aneurysm)   ? Allergic state   ? Anemia   ? IRON DEF.ANEMIA  ? Arthritis   ? OSTEOARTHRITIS OF BIL.KNEES  ? B12 deficiency   ? Basal cell carcinoma   ? Callus of foot   ? bil.  ? Cancer ([place])   ? of CECUM  ? Cataract cortical, senile   ? RIGHT EYE  ? Cholesterol-lowering agent myopathy 2004  ? Chronic diastolic heart failure ([place])   ? Chronic kidney disease, stage 3a ([place])   ? Diabetes mellitus without complication ([place])   ? Diabetic neuropathy ([place])   ? Diverticulosis   ? Drug-induced myopathy   ? Eczema   ? GERD (gastroesophageal reflux disease)   ? Gout   ? Hemorrhoids   ? History of chicken pox   ? History of hypercalcemia   ? Hypercholesterolemia   ? Hyperparathyroidism ([place])   ? Hypertension   ? Hypothyroidism   ?  Incontinent of urine   ? Lumbago   ? Mycotic toenails   ? Neuropathy due to secondary diabetes mellitus ([place])   ? Wet senile macular degeneration ([place])   ? ? ?Past Surgical History:  ?Procedure Laterality Date  ? BUNIONECTOMY  2006  ? COLONOSCOPY    ? COLONOSCOPY WITH PROPOFOL N/A 12/09/2014  ? Procedure: COLONOSCOPY WITH PROPOFOL;  Surgeon: Lollie Sails, MD;  Location: [facility] ENDOSCOPY;  Service: Endoscopy;  Laterality: N/A;  ? COLONOSCOPY WITH PROPOFOL N/A 05/03/2017  ? Procedure: COLONOSCOPY WITH PROPOFOL;  Surgeon: Lollie Sails, MD;  Location: [facility] ENDOSCOPY;  Service: Endoscopy;  Laterality: N/A;  ? DEBRIDEMENT OF CALLUSES Bilateral   ? FEET  ? DIAGNOSTIC LAPAROSCOPY    ? ESOPHAGOGASTRODUODENOSCOPY    ? EYE SURGERY    ? GOUT SURGERY    ? KNEE ARTHROSCOPY    ? LAPAROSCOPIC PARTIAL COLECTOMY Right 03/16/2010  ? MOHS SURGERY  12/2008  ? MOUTH SURGERY    ? PARATHYROIDECTOMY  2001  ? TONSILLECTOMY    ? ? ?Social History ?Social History  ? ?Tobacco Use  ? Smoking status: Former  ?  Smokeless tobacco: Never  ?Vaping Use  ? Vaping Use: Never used  ?Substance Use Topics  ? Alcohol use: Yes  ?  Comment: WINE  ? Drug use: No  ? ? ?Family History ?No family history on file. ? ?Allergies  ?Allergen Reactions  ? Simvastatin   ?  Other reaction(s): Other (See Comments) ?Other Reaction: Myositis  ? Ace Inhibitors   ?  Other reaction(s): Cough  ? Angiotensin Receptor Blockers Cough  ?  Other reaction(s): Cough  ? Lactose Other (See Comments)  ?  intolerant  ? Losartan   ?  Other reaction(s): Unknown  ? Penicillins Rash  ?  Other reaction(s): Other (See Comments) ?Other Reaction: ALLERGY ?  ? ? ? ?REVIEW OF SYSTEMS (Negative unless checked) ? ?Constitutional: '[]'$ Weight loss  '[]'$ Fever  '[]'$ Chills ?Cardiac: '[]'$ Chest pain   '[]'$ Chest pressure   '[]'$ Palpitations   '[]'$ Shortness of breath when laying flat   '[]'$ Shortness of breath at rest   '[]'$ Shortness of breath with exertion. ?Vascular:  '[]'$ Pain in legs with walking   '[]'$ Pain in legs at rest   '[]'$ Pain in legs when laying flat   '[]'$ Claudication   '[]'$ Pain in feet when walking  '[]'$ Pain in feet at rest  '[]'$ Pain in feet when laying flat   '[]'$ History of DVT   '[]'$ Phlebitis   '[x]'$ Swelling in legs   '[]'$ Varicose veins   '[]'$ Non-healing ulcers ?Pulmonary:   '[]'$ Uses home oxygen   '[]'$ Productive cough   '[]'$ Hemoptysis   '[]'$ Wheeze  '[]'$ COPD   '[]'$ Asthma ?Neurologic:  '[]'$ Dizziness  '[]'$ Blackouts   '[]'$ Seizures   '[]'$ History of stroke   '[]'$ History of TIA  '[]'$ Aphasia   '[]'$ Temporary blindness   '[]'$ Dysphagia   '[]'$ Weakness or numbness in arms   '[]'$ Weakness or numbness in legs ?Musculoskeletal:  '[]'$ Arthritis   '[]'$ Joint swelling   '[]'$ Joint pain   '[]'$ Low back pain ?Hematologic:  '[]'$ Easy bruising  '[]'$ Easy bleeding   '[]'$ Hypercoagulable state   '[]'$ Anemic  '[]'$ Hepatitis ?Gastrointestinal:  '[]'$ Blood in stool   '[]'$ Vomiting blood  '[]'$ Gastroesophageal reflux/heartburn   '[]'$ Difficulty swallowing. ?Genitourinary:  '[]'$ Chronic kidney disease   '[]'$ Difficult urination  '[]'$ Frequent urination  '[]'$ Burning with urination   '[]'$ Blood in urine ?Skin:  '[]'$ Rashes    '[x]'$ Ulcers   '[x]'$ Wounds ?Psychological:  '[]'$ History of anxiety   '[]'$  History of major depression. ? ?Physical Examination ? ?Vitals:  ? 07/07/21 1550 07/07/21 1551 07/07/21 1600 07/07/21 1753  ?  BP: 131/75  126/67 (!) 141/60  ?Pulse: 80 80 79 85  ?Resp: 16   16  ?Temp: 98.4 ?F (36.9 ?C)   97.7 ?F (36.5 ?C)  ?TempSrc: Oral     ?SpO2: 98% 96% 94% 100%  ?Weight:      ?Height:      ? ?Body mass index is 29.28 kg/m?. ?Gen:  WD/WN, NAD ?Head: [place]/AT, No temporalis wasting. Prominent temp pulse not noted. ?Ear/Nose/Throat: Hearing grossly intact, nares w/o erythema or drainage, oropharynx w/o Erythema/Exudate ?Eyes: Sclera non-icteric, conjunctiva clear ?Neck: Trachea midline.  No JVD.  ?Pulmonary:  Good air movement, respirations not labored, equal bilaterally.  ?Cardiac: RRR, normal S1, S2. ?Vascular: 1+ edema bilaterally ?Vessel Right Left  ?PT Not Palpable Not Palpable  ?DP Not Palpable Not Palpable  ? ?Gastrointestinal: Large hernia ?Musculoskeletal: M/S 5/5 throughout.  Extremities without ischemic changes.  No deformity or atrophy. No edema. ?Neurologic: Sensation grossly intact in extremities.  Symmetrical.  Speech is fluent. Motor exam as listed above. ?Psychiatric: Judgment intact, Mood & affect appropriate for pt's clinical situation. ?Dermatologic: Wound on left medial foot as well as second toe ?Lymph : No Cervical, Axillary, or Inguinal lymphadenopathy. ? ? ? ? ?CBC ?Lab Results  ?Component Value Date  ? WBC 7.8 07/06/2021  ? HGB 11.4 (L) 07/06/2021  ? HCT 36.8 07/06/2021  ? MCV 93.6 07/06/2021  ? PLT 193 07/06/2021  ? ? ?BMET ?   ?Component Value Date/Time  ? NA 137 07/06/2021 1947  ? NA 141 12/07/2013 1035  ? K 3.7 07/06/2021 1947  ? K 4.3 12/07/2013 1035  ? CL 99 07/06/2021 1947  ? CL 103 12/07/2013 1035  ? CO2 26 07/06/2021 1947  ? CO2 26 12/07/2013 1035  ? GLUCOSE 410 (H) 07/06/2021 1947  ? GLUCOSE 242 (H) 12/07/2013 1035  ? BUN 20 07/06/2021 1947  ? BUN 16 12/07/2013 1035  ? CREATININE 1.01 (H) 07/07/2021  0749  ? CREATININE 1.24 12/07/2013 1035  ? CALCIUM 8.9 07/06/2021 1947  ? CALCIUM 8.5 12/07/2013 1035  ? GFRNONAA 54 (L) 07/07/2021 0749  ? GFRNONAA 41 (L) 12/07/2013 1035  ? [place]

## 2021-07-07 NOTE — Hospital Course (Addendum)
Andrea Lawson is an 88 yo female with PMH chronic dCHF, CKD3a, DMII, GERD, HLD, hyperparathyroidism, HTN, hypothyroidism, diabetic neuropathy, macular degeneration who presented to the hospital after referral from her podiatrist office due to concern for osteomyelitis involving her left foot second digit.  She was started on vancomycin and Zosyn.  She was evaluated by podiatry and underwent left second toe amputation on 07/09/2021. ?She was evaluated by physical therapy with recommendations for SNF.  She accepted a bed at [facility]. ?

## 2021-07-07 NOTE — Progress Notes (Signed)
Pharmacy Antibiotic Note ? ?Andrea Lawson is a 88 y.o. female admitted on 07/06/2021 with  osteomyelitis .  Pharmacy has been consulted for Vanc, Zosyn dosing. ? ?07/08/27 ?Scr improved 1.20 > 1.01 ? ?Plan: ?Continue Zosyn 3.375 gm IV Q8H  ? ?Adjust Vancomycin to 750 mg IV Q24H  ?Updated Estimated ?AUC = 551 ?Vanc trough = 16.6  ? ?Height: 5' (152.4 cm) ?Weight: 68 kg (149 lb 14.6 oz) ?IBW/kg (Calculated) : 45.5 ? ?Temp (24hrs), Avg:98.2 ?F (36.8 ?C), Min:97.6 ?F (36.4 ?C), Max:98.8 ?F (37.1 ?C) ? ?Recent Labs  ?Lab 07/06/21 ?1947 07/06/21 ?2201 07/07/21 ?8676  ?WBC 7.8  --   --   ?CREATININE 1.20*  --  1.01*  ?LATICACIDVEN 2.8* 2.3*  --   ? ?  ?Estimated Creatinine Clearance: 33.8 mL/min (A) (by C-G formula based on SCr of 1.01 mg/dL (H)).   ? ?Allergies  ?Allergen Reactions  ? Simvastatin   ?  Other reaction(s): Other (See Comments) ?Other Reaction: Myositis  ? Ace Inhibitors   ?  Other reaction(s): Cough  ? Angiotensin Receptor Blockers Cough  ?  Other reaction(s): Cough  ? Lactose Other (See Comments)  ?  intolerant  ? Losartan   ?  Other reaction(s): Unknown  ? Penicillins Rash  ?  Other reaction(s): Other (See Comments) ?Other Reaction: ALLERGY ?  ? ? ?Antimicrobials this admission: ?3/27 Vanco  >>  ?3/27 Zosyn  >>  ? ?Dose adjustments this admission: ?3/28: Adjust Vancomycin to 750 mg IV Q24H  ? ?Microbiology results: ? 3/27 BCx: NGTD ? ? ?Thank you for allowing pharmacy to be a part of this patient?s care. ? ?Dorothe Pea, PharmD, BCPS ?Clinical Pharmacist   ?07/07/2021 9:26 AM ? ?

## 2021-07-07 NOTE — Consult Note (Signed)
Reason for Consult: Osteomyelitis left second toe. ?Referring Physician: Damita Dunnings ? ?Andrea Lawson is an 88 y.o. female.  ?HPI: This is an 88 year old female with diabetes and associated neuropathy with chronic ulcerations on her left foot.  Patient has been managed outpatient by Dr. Vickki Muff but recent worsening of the ulceration with obvious infection into the bone of the distal phalanx recommendation was for admission to the hospital for amputation. ? ?Past Medical History:  ?Diagnosis Date  ? AAA (abdominal aortic aneurysm)   ? Allergic state   ? Anemia   ? IRON DEF.ANEMIA  ? Arthritis   ? OSTEOARTHRITIS OF BIL.KNEES  ? B12 deficiency   ? Basal cell carcinoma   ? Callus of foot   ? bil.  ? Cancer ([place])   ? of CECUM  ? Cataract cortical, senile   ? RIGHT EYE  ? Cholesterol-lowering agent myopathy 2004  ? Chronic diastolic heart failure ([place])   ? Chronic kidney disease, stage 3a ([place])   ? Diabetes mellitus without complication ([place])   ? Diabetic neuropathy ([place])   ? Diverticulosis   ? Drug-induced myopathy   ? Eczema   ? GERD (gastroesophageal reflux disease)   ? Gout   ? Hemorrhoids   ? History of chicken pox   ? History of hypercalcemia   ? Hypercholesterolemia   ? Hyperparathyroidism ([place])   ? Hypertension   ? Hypothyroidism   ? Incontinent of urine   ? Lumbago   ? Mycotic toenails   ? Neuropathy due to secondary diabetes mellitus ([place])   ? Wet senile macular degeneration ([place])   ? ? ?Past Surgical History:  ?Procedure Laterality Date  ? BUNIONECTOMY  2006  ? COLONOSCOPY    ? COLONOSCOPY WITH PROPOFOL N/A 12/09/2014  ? Procedure: COLONOSCOPY WITH PROPOFOL;  Surgeon: Lollie Sails, MD;  Location: [facility] ENDOSCOPY;  Service: Endoscopy;  Laterality: N/A;  ? COLONOSCOPY WITH PROPOFOL N/A 05/03/2017  ? Procedure: COLONOSCOPY WITH PROPOFOL;  Surgeon: Lollie Sails, MD;  Location: [facility] ENDOSCOPY;  Service: Endoscopy;  Laterality: N/A;  ? DEBRIDEMENT OF CALLUSES Bilateral   ? FEET  ? DIAGNOSTIC LAPAROSCOPY    ?  ESOPHAGOGASTRODUODENOSCOPY    ? EYE SURGERY    ? GOUT SURGERY    ? KNEE ARTHROSCOPY    ? LAPAROSCOPIC PARTIAL COLECTOMY Right 03/16/2010  ? MOHS SURGERY  12/2008  ? MOUTH SURGERY    ? PARATHYROIDECTOMY  2001  ? TONSILLECTOMY    ? ? ?No family history on file. ? ?Social History:  reports that she has quit smoking. She has never used smokeless tobacco. She reports current alcohol use. She reports that she does not use drugs. ? ?Allergies:  ?Allergies  ?Allergen Reactions  ? Simvastatin   ?  Other reaction(s): Other (See Comments) ?Other Reaction: Myositis  ? Ace Inhibitors   ?  Other reaction(s): Cough  ? Angiotensin Receptor Blockers Cough  ?  Other reaction(s): Cough  ? Lactose Other (See Comments)  ?  intolerant  ? Losartan   ?  Other reaction(s): Unknown  ? Penicillins Rash  ?  Other reaction(s): Other (See Comments) ?Other Reaction: ALLERGY ?  ? ? ?Medications: Scheduled: ? carvedilol  6.25 mg Oral BID WC  ? insulin aspart  0-15 Units Subcutaneous Q4H  ? latanoprost  1 drop Both Eyes QPM  ? ? ?Results for orders placed or performed during the hospital encounter of 07/06/21 (from the past 48 hour(s))  ?Blood culture (routine x 2)  Status: None (Preliminary result)  ? Collection Time: 07/06/21  7:30 PM  ? Specimen: BLOOD  ?Result Value Ref Range  ? Specimen Description BLOOD LEFT ANTECUBITAL   ? Special Requests    ?  BOTTLES DRAWN AEROBIC AND ANAEROBIC Blood Culture adequate volume  ? Culture    ?  NO GROWTH < 12 HOURS ?Performed at [facility], [address] [place], [place] [address] ?  ? Report Status PENDING   ?CBC     Status: Abnormal  ? Collection Time: 07/06/21  7:47 PM  ?Result Value Ref Range  ? WBC 7.8 4.0 - 10.5 K/uL  ? RBC 3.93 3.87 - 5.11 MIL/uL  ? Hemoglobin 11.4 (L) 12.0 - 15.0 g/dL  ? HCT 36.8 36.0 - 46.0 %  ? MCV 93.6 80.0 - 100.0 fL  ? MCH 29.0 26.0 - 34.0 pg  ? MCHC 31.0 30.0 - 36.0 g/dL  ? RDW 12.6 11.5 - 15.5 %  ? Platelets 193 150 - 400 K/uL  ? nRBC 0.0 0.0 - 0.2 %  ?   Comment: Performed at [facility], [address], [place], [place] [address]  ?Basic metabolic panel     Status: Abnormal  ? Collection Time: 07/06/21  7:47 PM  ?Result Value Ref Range  ? Sodium 137 135 - 145 mmol/L  ? Potassium 3.7 3.5 - 5.1 mmol/L  ? Chloride 99 98 - 111 mmol/L  ? CO2 26 22 - 32 mmol/L  ? Glucose, Bld 410 (H) 70 - 99 mg/dL  ?  Comment: Glucose reference range applies only to samples taken after fasting for at least 8 hours.  ? BUN 20 8 - 23 mg/dL  ? Creatinine, Ser 1.20 (H) 0.44 - 1.00 mg/dL  ? Calcium 8.9 8.9 - 10.3 mg/dL  ? GFR, Estimated 44 (L) >60 mL/min  ?  Comment: (NOTE) ?Calculated using the CKD-EPI Creatinine Equation (2021) ?  ? Anion gap 12 5 - 15  ?  Comment: Performed at [facility], [address], [place], [place] [address]  ?Blood culture (routine x 2)     Status: None (Preliminary result)  ? Collection Time: 07/06/21  7:47 PM  ? Specimen: BLOOD  ?Result Value Ref Range  ? Specimen Description BLOOD BLOOD RIGHT HAND   ? Special Requests    ?  BOTTLES DRAWN AEROBIC AND ANAEROBIC Blood Culture adequate volume  ? Culture    ?  NO GROWTH < 12 HOURS ?Performed at [facility], [address] [place], [place] [address] ?  ? Report Status PENDING   ?Lactic acid, plasma     Status: Abnormal  ? Collection Time: 07/06/21  7:47 PM  ?Result Value Ref Range  ? Lactic Acid, Venous 2.8 (HH) 0.5 - 1.9 mmol/L  ?  Comment: CRITICAL RESULT CALLED TO, READ BACK BY AND VERIFIED WITH ?LISA THOMPSON '@2032'$  ON 07/06/21 SKL ?Performed at [facility], [address] [place],  [address] ?  ?Resp Panel by RT-PCR (Flu A&B, Covid) Nasopharyngeal Swab     Status: None  ? Collection Time: 07/06/21  7:47 PM  ? Specimen: Nasopharyngeal Swab; Nasopharyngeal(NP) swabs in vial transport medium  ?Result Value Ref Range  ? SARS Coronavirus 2 by RT PCR NEGATIVE NEGATIVE  ?  Comment: (NOTE) ?SARS-CoV-2 target nucleic acids are NOT DETECTED. ? ?The SARS-CoV-2 RNA is  generally detectable in upper respiratory ?specimens during the acute phase of infection. The lowest ?concentration of SARS-CoV-2 viral copies this assay can  detect is ?138 copies/mL. A negative result does not preclude SARS-Cov-2 ?infection and should not be used as the sole basis for treatment or ?other patient management decisions. A negative result may occur with  ?improper specimen collection/handling, submission of specimen other ?than nasopharyngeal swab, presence of viral mutation(s) within the ?areas targeted by this assay, and inadequate number of viral ?copies(<138 copies/mL). A negative result must be combined with ?clinical observations, patient history, and epidemiological ?information. The expected result is Negative. ? ?Fact Sheet for Patients:  ?EntrepreneurPulse.com.au ? ?Fact Sheet for Healthcare Providers:  ?IncredibleEmployment.be ? ?This test is no t yet approved or cleared by the [place] FDA and  ?has been authorized for detection and/or diagnosis of SARS-CoV-2 by ?FDA under an Emergency Use Authorization (EUA). This EUA will remain  ?in effect (meaning this test can be used) for the duration of the ?COVID-19 declaration under Section 564(b)(1) of the Act, 21 ?U.S.C.section 360bbb-3(b)(1), unless the authorization is terminated  ?or revoked sooner.  ? ? ?  ? Influenza A by PCR NEGATIVE NEGATIVE  ? Influenza B by PCR NEGATIVE NEGATIVE  ?  Comment: (NOTE) ?The Xpert Xpress SARS-CoV-2/FLU/RSV plus assay is intended as an aid ?in the diagnosis of influenza from Nasopharyngeal swab specimens and ?should not be used as a sole basis for treatment. Nasal washings and ?aspirates are unacceptable for Xpert Xpress SARS-CoV-2/FLU/RSV ?testing. ? ?Fact Sheet for Patients: ?EntrepreneurPulse.com.au ? ?Fact Sheet for Healthcare Providers: ?IncredibleEmployment.be ? ?This test is not yet approved or cleared by the [place] FDA  and ?has been authorized for detection and/or diagnosis of SARS-CoV-2 by ?FDA under an Emergency Use Authorization (EUA). This EUA will remain ?in effect (meaning this test can be used) for the duration of the ?COVID-19 de

## 2021-07-07 NOTE — Progress Notes (Addendum)
?Progress Note ? ? ? ?Andrea Lawson   ?HOZ:[id number]  ?DOB: 1931-10-04  ?DOA: 07/06/2021     1 ?PCP: Andrea [place], MD ? ?Initial CC: toe infection ? ?Hospital Course: ?Ms. Andrea Lawson is an 88 yo female with PMH chronic dCHF, CKD3a, DMII, GERD, HLD, hyperparathyroidism, HTN, hypothyroidism, diabetic neuropathy, macular degeneration who presented to the hospital after referral from her podiatrist office due to concern for osteomyelitis involving her left foot second digit.  She was started on vancomycin and Zosyn.  She was evaluated by podiatry with tentative plans for second toe amputation during hospitalization.  ? ?Interval History:  ?Seen this morning in the ER.  She understands tentative plans for toe amputation during hospitalization but will also be evaluated by vascular surgery. ? ?Assessment and Plan: ?* Osteomyelitis of second toe of left foot ([place]) ?- Continue vancomycin; d/c zosyn (given CKD3, would like to avoid increased risk of renal toxicity with vanc/zosyn combo) ?- change zosyn to rocephin and flagyl; if clinical worsening can consider cefepime for pseudomonal coverage but holding off for now ?- appreciate podiatry consult; tentative plan for surgery in 1-2 days ?- follow up vascular surgery consult as well ?- continue pain control ? ?Lactic acidosis ?- s/p IVF with good response ? ?Type 2 diabetes mellitus with other circulatory complications (HCC) ?- B0W 10% ?- resume basal insulin; will adjust as needed ?-Metformin on hold during hospitalization, but will max dose at discharge after clarifying if she is tolerating ?- continue SSI and CBG monitoring  ?- greatly appreciate diabetes coordinator note; agree that ALF needs to check CBG at least daily and also administer insulin or at least observe/monitor/educate patient on proper administration  ? ?Chronic anemia ?- Baseline approximately 10 to 11 g/dL ?- Currently at baseline ? ?HTN (hypertension) ?Controlled.  Continue carvedilol ? ?Chronic kidney  disease, stage 3a ([place]) ?- patient has history of CKD3a. Baseline creat ~ 1.2, eGFR 50 ? ? ?Chronic diastolic heart failure (HCC) ?- no s/s exacerbation  ?- Continue carvedilol ? ? ?Old records reviewed in assessment of this patient ? ?Antimicrobials: ?Vancomycin 07/06/2021 >> current ?Zosyn 07/06/2021 >> current ? ?DVT prophylaxis:  ?enoxaparin (LOVENOX) injection 40 mg Start: 07/07/21 2200 ?SCDs Start: 07/06/21 2347 ? ? ?Code Status:   Code Status: DNR ? ?Disposition Plan:  Likely SNF in 3-4 days ?Status is: Inpt ? ?Objective: ?Blood pressure 118/63, pulse 75, temperature 98.1 ?F (36.7 ?C), resp. rate 18, height 5' (1.524 m), weight 68 kg, SpO2 94 %.  ?Examination:  ?Physical Exam ?Constitutional:   ?   General: She is not in acute distress. ?   Appearance: Normal appearance.  ?HENT:  ?   Head: Normocephalic and atraumatic.  ?   Mouth/Throat:  ?   Mouth: Mucous membranes are moist.  ?Eyes:  ?   Extraocular Movements: Extraocular movements intact.  ?Cardiovascular:  ?   Rate and Rhythm: Normal rate and regular rhythm.  ?Pulmonary:  ?   Effort: Pulmonary effort is normal.  ?   Breath sounds: Normal breath sounds.  ?Abdominal:  ?   General: Bowel sounds are normal. There is no distension.  ?   Palpations: Abdomen is soft.  ?   Tenderness: There is no abdominal tenderness.  ?Musculoskeletal:  ?   Cervical back: Normal range of motion and neck supple.  ?   Comments: Left foot 2nd digit noted with swollen toe with purulent drainage from tip of toe; lower leg also noted with mild edema and erythema   ?  Neurological:  ?   Mental Status: She is alert.  ?  ? ?Consultants:  ?Podiatry ?Vascular Surgery ? ?Procedures:  ? ? ?Data Reviewed: ?Results for orders placed or performed during the hospital encounter of 07/06/21 (from the past 24 hour(s))  ?Blood culture (routine x 2)     Status: None (Preliminary result)  ? Collection Time: 07/06/21  7:30 PM  ? Specimen: BLOOD  ?Result Value Ref Range  ? Specimen Description BLOOD LEFT  ANTECUBITAL   ? Special Requests    ?  BOTTLES DRAWN AEROBIC AND ANAEROBIC Blood Culture adequate volume  ? Culture    ?  NO GROWTH < 12 HOURS ?Performed at [facility], [address], [place], [place] [address] ?  ? Report Status PENDING   ?CBC     Status: Abnormal  ? Collection Time: 07/06/21  7:47 PM  ?Result Value Ref Range  ? WBC 7.8 4.0 - 10.5 K/uL  ? RBC 3.93 3.87 - 5.11 MIL/uL  ? Hemoglobin 11.4 (L) 12.0 - 15.0 g/dL  ? HCT 36.8 36.0 - 46.0 %  ? MCV 93.6 80.0 - 100.0 fL  ? MCH 29.0 26.0 - 34.0 pg  ? MCHC 31.0 30.0 - 36.0 g/dL  ? RDW 12.6 11.5 - 15.5 %  ? Platelets 193 150 - 400 K/uL  ? nRBC 0.0 0.0 - 0.2 %  ?Basic metabolic panel     Status: Abnormal  ? Collection Time: 07/06/21  7:47 PM  ?Result Value Ref Range  ? Sodium 137 135 - 145 mmol/L  ? Potassium 3.7 3.5 - 5.1 mmol/L  ? Chloride 99 98 - 111 mmol/L  ? CO2 26 22 - 32 mmol/L  ? Glucose, Bld 410 (H) 70 - 99 mg/dL  ? BUN 20 8 - 23 mg/dL  ? Creatinine, Ser 1.20 (H) 0.44 - 1.00 mg/dL  ? Calcium 8.9 8.9 - 10.3 mg/dL  ? GFR, Estimated 44 (L) >60 mL/min  ? Anion gap 12 5 - 15  ?Blood culture (routine x 2)     Status: None (Preliminary result)  ? Collection Time: 07/06/21  7:47 PM  ? Specimen: BLOOD  ?Result Value Ref Range  ? Specimen Description BLOOD BLOOD RIGHT HAND   ? Special Requests    ?  BOTTLES DRAWN AEROBIC AND ANAEROBIC Blood Culture adequate volume  ? Culture    ?  NO GROWTH < 12 HOURS ?Performed at [facility], [address], [place], [place] [address] ?  ? Report Status PENDING   ?Lactic acid, plasma     Status: Abnormal  ? Collection Time: 07/06/21  7:47 PM  ?Result Value Ref Range  ? Lactic Acid, Venous 2.8 (HH) 0.5 - 1.9 mmol/L  ?Resp Panel by RT-PCR (Flu A&B, Covid) Nasopharyngeal Swab     Status: None  ? Collection Time: 07/06/21  7:47 PM  ? Specimen: Nasopharyngeal Swab; Nasopharyngeal(NP) swabs in vial transport medium  ?Result Value Ref Range  ? SARS Coronavirus 2 by RT PCR NEGATIVE NEGATIVE  ? Influenza A by PCR  NEGATIVE NEGATIVE  ? Influenza B by PCR NEGATIVE NEGATIVE  ?Lactic acid, plasma     Status: Abnormal  ? Collection Time: 07/06/21 10:01 PM  ?Result Value Ref Range  ? Lactic Acid, Venous 2.3 (HH) 0.5 - 1.9 mmol/L  ?CBG monitoring, ED     Status: Abnormal  ? Collection Time: 07/06/21 10:24 PM  ?Result Value Ref Range  ? Glucose-Capillary 269 (H) 70 - 99 mg/dL  ?Hemoglobin A1c  Status: Abnormal  ? Collection Time: 07/07/21 12:26 AM  ?Result Value Ref Range  ? Hgb A1c MFr Bld 10.0 (H) 4.8 - 5.6 %  ? Mean Plasma Glucose 240.3 mg/dL  ?Sedimentation rate     Status: Abnormal  ? Collection Time: 07/07/21 12:26 AM  ?Result Value Ref Range  ? Sed Rate 63 (H) 0 - 30 mm/hr  ?C-reactive protein     Status: Abnormal  ? Collection Time: 07/07/21 12:26 AM  ?Result Value Ref Range  ? CRP 5.1 (H) <1.0 mg/dL  ?Prealbumin     Status: Abnormal  ? Collection Time: 07/07/21 12:26 AM  ?Result Value Ref Range  ? Prealbumin 8.7 (L) 18 - 38 mg/dL  ?CBG monitoring, ED     Status: Abnormal  ? Collection Time: 07/07/21  4:58 AM  ?Result Value Ref Range  ? Glucose-Capillary 103 (H) 70 - 99 mg/dL  ?CBG monitoring, ED     Status: Abnormal  ? Collection Time: 07/07/21  7:30 AM  ?Result Value Ref Range  ? Glucose-Capillary 133 (H) 70 - 99 mg/dL  ?Creatinine, serum     Status: Abnormal  ? Collection Time: 07/07/21  7:49 AM  ?Result Value Ref Range  ? Creatinine, Ser 1.01 (H) 0.44 - 1.00 mg/dL  ? GFR, Estimated 54 (L) >60 mL/min  ?CBG monitoring, ED     Status: Abnormal  ? Collection Time: 07/07/21  9:37 AM  ?Result Value Ref Range  ? Glucose-Capillary 112 (H) 70 - 99 mg/dL  ?CBG monitoring, ED     Status: Abnormal  ? Collection Time: 07/07/21 11:31 AM  ?Result Value Ref Range  ? Glucose-Capillary 152 (H) 70 - 99 mg/dL  ?  ?I have Reviewed nursing notes, Vitals, and Lab results since pt's last encounter. Pertinent lab results : see above ?I have ordered test including BMP, CBC, Mg ?I have reviewed the last note from staff over past 24 hours ?I have  discussed pt's care plan and test results with nursing staff, case manager ? ? LOS: 1 day  ? ?Dwyane Dee, MD ?[facility] ?07/07/2021, 3:39 PM ? ?

## 2021-07-07 NOTE — Progress Notes (Addendum)
1826 ?Assessment Completed. Ax4. Lungs clear on RA . 20 G IV to L forearm. Mepilex Dressing to sacrum placed for prevention place. Dual skin assessment completed with Audrill RN . Skin to L foot Red and warm/hot to touch. 2nd toe red and swollen. On lovonox  for VTE. Umbilical hernia to abdomen pt states has been there for years. Personal belongings and call bell within reach. Bed in locked and low position. Nurse will cont. To monitor. ? ?

## 2021-07-08 ENCOUNTER — Encounter: Payer: Self-pay | Admitting: Internal Medicine

## 2021-07-08 ENCOUNTER — Other Ambulatory Visit (INDEPENDENT_AMBULATORY_CARE_PROVIDER_SITE_OTHER): Payer: Self-pay | Admitting: Nurse Practitioner

## 2021-07-08 LAB — CBC WITH DIFFERENTIAL/PLATELET
Abs Immature Granulocytes: 0.07 10*3/uL (ref 0.00–0.07)
Basophils Absolute: 0 10*3/uL (ref 0.0–0.1)
Basophils Relative: 0 %
Eosinophils Absolute: 0.2 10*3/uL (ref 0.0–0.5)
Eosinophils Relative: 2 %
HCT: 35.3 % — ABNORMAL LOW (ref 36.0–46.0)
Hemoglobin: 11 g/dL — ABNORMAL LOW (ref 12.0–15.0)
Immature Granulocytes: 1 %
Lymphocytes Relative: 12 %
Lymphs Abs: 1.1 10*3/uL (ref 0.7–4.0)
MCH: 29.2 pg (ref 26.0–34.0)
MCHC: 31.2 g/dL (ref 30.0–36.0)
MCV: 93.6 fL (ref 80.0–100.0)
Monocytes Absolute: 0.7 10*3/uL (ref 0.1–1.0)
Monocytes Relative: 8 %
Neutro Abs: 7.2 10*3/uL (ref 1.7–7.7)
Neutrophils Relative %: 77 %
Platelets: 168 10*3/uL (ref 150–400)
RBC: 3.77 MIL/uL — ABNORMAL LOW (ref 3.87–5.11)
RDW: 12.6 % (ref 11.5–15.5)
WBC: 9.3 10*3/uL (ref 4.0–10.5)
nRBC: 0 % (ref 0.0–0.2)

## 2021-07-08 LAB — BASIC METABOLIC PANEL
Anion gap: 8 (ref 5–15)
BUN: 15 mg/dL (ref 8–23)
CO2: 27 mmol/L (ref 22–32)
Calcium: 8.1 mg/dL — ABNORMAL LOW (ref 8.9–10.3)
Chloride: 105 mmol/L (ref 98–111)
Creatinine, Ser: 0.93 mg/dL (ref 0.44–1.00)
GFR, Estimated: 59 mL/min — ABNORMAL LOW (ref >60)
Glucose, Bld: 139 mg/dL — ABNORMAL HIGH (ref 70–99)
Potassium: 3.6 mmol/L (ref 3.5–5.1)
Sodium: 140 mmol/L (ref 135–145)

## 2021-07-08 LAB — GLUCOSE, CAPILLARY
Glucose-Capillary: 108 mg/dL — ABNORMAL HIGH (ref 70–99)
Glucose-Capillary: 121 mg/dL — ABNORMAL HIGH (ref 70–99)
Glucose-Capillary: 125 mg/dL — ABNORMAL HIGH (ref 70–99)
Glucose-Capillary: 154 mg/dL — ABNORMAL HIGH (ref 70–99)
Glucose-Capillary: 157 mg/dL — ABNORMAL HIGH (ref 70–99)
Glucose-Capillary: 158 mg/dL — ABNORMAL HIGH (ref 70–99)

## 2021-07-08 LAB — MAGNESIUM: Magnesium: 1.7 mg/dL (ref 1.7–2.4)

## 2021-07-08 LAB — CREATININE, SERUM
Creatinine, Ser: 0.74 mg/dL (ref 0.44–1.00)
GFR, Estimated: >60 mL/min (ref >60)

## 2021-07-08 MED ORDER — INSULIN ASPART 100 UNIT/ML IJ SOLN: 0.0000 [IU] | Freq: Every day | INTRAMUSCULAR | Status: DC

## 2021-07-08 MED ORDER — MIDAZOLAM HCL 2 MG/ML PO SYRP
8.0000 mg | ORAL_SOLUTION | Freq: Once | ORAL | Status: DC | PRN
Start: 2021-07-08 — End: 2021-07-09
  Filled 2021-07-08: qty 4

## 2021-07-08 MED ORDER — CLINDAMYCIN PHOSPHATE 300 MG/50ML IV SOLN: 300.0000 mg | Freq: Once | INTRAVENOUS | Status: AC

## 2021-07-08 MED ORDER — ONDANSETRON HCL 4 MG/2ML IJ SOLN: 4.0000 mg | Freq: Four times a day (QID) | INTRAMUSCULAR | Status: DC | PRN

## 2021-07-08 MED ORDER — INSULIN ASPART 100 UNIT/ML IJ SOLN
0.0000 [IU] | Freq: Three times a day (TID) | INTRAMUSCULAR | Status: DC
  Administered 2021-07-08: 2 [IU] via SUBCUTANEOUS
  Administered 2021-07-08 – 2021-07-09 (×2): 3 [IU] via SUBCUTANEOUS
  Administered 2021-07-09: 8 [IU] via SUBCUTANEOUS
  Administered 2021-07-10: 3 [IU] via SUBCUTANEOUS
  Administered 2021-07-10: 2 [IU] via SUBCUTANEOUS
  Administered 2021-07-10 – 2021-07-11 (×2): 3 [IU] via SUBCUTANEOUS
  Administered 2021-07-11: 5 [IU] via SUBCUTANEOUS
  Filled 2021-07-08 (×8): qty 1

## 2021-07-08 MED ORDER — DIPHENHYDRAMINE HCL 50 MG/ML IJ SOLN: 50.0000 mg | Freq: Once | INTRAMUSCULAR | Status: DC | PRN

## 2021-07-08 MED ORDER — INSULIN ASPART 100 UNIT/ML IJ SOLN: 0.0000 [IU] | Freq: Three times a day (TID) | INTRAMUSCULAR | Status: DC

## 2021-07-08 MED ORDER — FAMOTIDINE 20 MG PO TABS: 40.0000 mg | ORAL_TABLET | Freq: Once | ORAL | Status: DC | PRN

## 2021-07-08 MED ORDER — METHYLPREDNISOLONE SODIUM SUCC 125 MG IJ SOLR
125.0000 mg | Freq: Once | INTRAMUSCULAR | Status: DC | PRN
Start: 2021-07-08 — End: 2021-07-09

## 2021-07-08 MED ORDER — HYDROMORPHONE HCL 1 MG/ML IJ SOLN: 1.0000 mg | Freq: Once | INTRAMUSCULAR | Status: DC | PRN

## 2021-07-08 NOTE — H&P (View-Only) (Signed)
? ?Subjective/Chief Complaint: ?Patient seen.  Somewhat confused.  No specific complaints. ? ? ?Objective: ?Vital signs in last 24 hours: ?Temp:  [97.7 ?F (36.5 ?C)-99.6 ?F (37.6 ?C)] 99.6 ?F (37.6 ?C) (03/29 1119) ?Pulse Rate:  [77-99] 84 (03/29 1119) ?Resp:  [16-19] 19 (03/29 1119) ?BP: (126-170)/(60-83) 136/75 (03/29 1119) ?SpO2:  [94 %-100 %] 98 % (03/29 1119) ?Last BM Date : 07/08/21 ? ?Intake/Output from previous day: ?03/28 0701 - 03/29 0700 ?In: 558.5 [I.V.:158.5; IV Piggyback:400] ?Out: 250 [Urine:250] ?Intake/Output this shift: ?Total I/O ?In: 343 [I.V.:343] ?Out: -  ? ?Left second toe dressing intact. ? ?Lab Results:  ?Recent Labs  ?  07/06/21 ?1947 07/08/21 ?8338  ?WBC 7.8 9.3  ?HGB 11.4* 11.0*  ?HCT 36.8 35.3*  ?PLT 193 168  ? ?BMET ?Recent Labs  ?  07/06/21 ?1947 07/07/21 ?0749 07/08/21 ?2505  ?NA 137  --  140  ?K 3.7  --  3.6  ?CL 99  --  105  ?CO2 26  --  27  ?GLUCOSE 410*  --  139*  ?BUN '20 15 15  '$ ?CREATININE 1.20* 1.01* 0.93  ?CALCIUM 8.9  --  8.1*  ? ?PT/INR ?No results for input(s): LABPROT, INR in the last 72 hours. ?ABG ?No results for input(s): PHART, HCO3 in the last 72 hours. ? ?Invalid input(s): PCO2, PO2 ? ?Studies/Results: ?DG Foot Complete Left ? ?Result Date: 07/06/2021 ?CLINICAL DATA:  Evaluation for osteomyelitis of the second and third left toes. EXAM: LEFT FOOT - COMPLETE 3+ VIEW COMPARISON:  None. FINDINGS: There is no evidence of an fracture or dislocation. Cortical destruction is seen along the distal phalanx of the second left toe. Marked severity diffuse soft tissue swelling of the second left toe is also noted. IMPRESSION: Findings suspicious for acute osteomyelitis involving the distal phalanx of the second left toe. Electronically Signed   By: Virgina Norfolk M.D.   On: 07/06/2021 23:14   ? ?Anti-infectives: ?Anti-infectives (From admission, onward)  ? ? Start     Dose/Rate Route Frequency Ordered Stop  ? 07/09/21 1530  clindamycin (CLEOCIN) IVPB 300 mg       ? 300  mg ?100 mL/hr over 30 Minutes Intravenous  Once 07/08/21 1357    ? 07/08/21 2200  vancomycin (VANCOREADY) IVPB 1250 mg/250 mL  Status:  Discontinued       ? 1,250 mg ?166.7 mL/hr over 90 Minutes Intravenous Every 48 hours 07/07/21 0059 07/07/21 0926  ? 07/07/21 2200  vancomycin (VANCOREADY) IVPB 750 mg/150 mL       ? 750 mg ?150 mL/hr over 60 Minutes Intravenous Every 24 hours 07/07/21 0926    ? 07/07/21 2200  cefTRIAXone (ROCEPHIN) 2 g in sodium chloride 0.9 % 100 mL IVPB       ? 2 g ?200 mL/hr over 30 Minutes Intravenous Every 24 hours 07/07/21 1537    ? 07/07/21 2200  metroNIDAZOLE (FLAGYL) IVPB 500 mg       ? 500 mg ?100 mL/hr over 60 Minutes Intravenous Every 12 hours 07/07/21 1537    ? 07/07/21 0500  piperacillin-tazobactam (ZOSYN) IVPB 3.375 g  Status:  Discontinued       ? 3.375 g ?12.5 mL/hr over 240 Minutes Intravenous Every 8 hours 07/07/21 0126 07/07/21 1536  ? 07/06/21 2300  vancomycin (VANCOREADY) IVPB 1500 mg/300 mL       ? 1,500 mg ?150 mL/hr over 120 Minutes Intravenous  Once 07/06/21 2247 07/07/21 0059  ? 07/06/21 2300  piperacillin-tazobactam (ZOSYN) IVPB 3.375 g       ?  3.375 g ?100 mL/hr over 30 Minutes Intravenous  Once 07/06/21 2247 07/06/21 2325  ? ?  ? ? ?Assessment/Plan: ?s/p Procedure(s): ?AMPUTATION TOE (Left) ?Assessment: Osteomyelitis left second toe. ? ?Plan: Patient scheduled for angiogram hopefully later today, if not tomorrow.  Discussed with the patient that my plan would be for amputation of the toe tomorrow evening.  Discussed possible risks and complications of the procedure including but not limited to inability of the amputation to heal due to her diabetes or vascular status as well as continued infection.  No guarantees could be given as to the outcome of healing potential.  Questions invited and answered.  We will obtain consent for amputation left second toe.  N.p.o. tomorrow after early breakfast tray.  Plan for surgery again tomorrow evening. ? LOS: 2 days  ? ? ?Durward Fortes ?07/08/2021 ? ?

## 2021-07-08 NOTE — Progress Notes (Signed)
?Progress Note ? ? ? ?Andrea Lawson   ?NLG:[id number]  ?DOB: [birth year]  ?DOA: 07/06/2021     2 ?PCP: Andrea Carbon, MD ? ?Initial CC: toe infection ? ?Hospital Course: ?Ms. Andrea Lawson is an 88 yo female with PMH chronic dCHF, CKD3a, DMII, GERD, HLD, hyperparathyroidism, HTN, hypothyroidism, diabetic neuropathy, macular degeneration who presented to the hospital after referral from her podiatrist office due to concern for osteomyelitis involving her left foot second digit.  She was started on vancomycin and Zosyn.  She was evaluated by podiatry with tentative plans for second toe amputation during hospitalization.  ? ?Interval History:  ?No events overnight. Resting in bed comfortable this morning. Understands plan still for toe amputation but first angiogram.  ? ?Assessment and Plan: ?* Osteomyelitis of second toe of left foot ([place]) ?- Continue vancomycin; d/c zosyn (given CKD3, would like to avoid increased risk of renal toxicity with vanc/zosyn combo) ?- changed zosyn to rocephin and flagyl; if clinical worsening can consider cefepime for pseudomonal coverage but holding off for now ?- appreciate podiatry consult; tentative plan for surgery tomorrow evening ?- follow up vascular surgery consult as well (tentative plan for angiogram either today vs tomorrow) ?- continue pain control ? ?Lactic acidosis ?- s/p IVF with good response ? ?Type 2 diabetes mellitus with other circulatory complications (HCC) ?- Y8X 10% ?- resume basal insulin; will adjust as needed ?-Metformin on hold during hospitalization, but will max dose at discharge after clarifying if she is tolerating ?- continue SSI and CBG monitoring  ?- greatly appreciate diabetes coordinator note; agree that ALF needs to check CBG at least daily and also administer insulin or at least observe/monitor/educate patient on proper administration  ? ?Chronic anemia ?- Baseline approximately 10 to 11 g/dL ?- Currently at baseline ? ?HTN (hypertension) ?Controlled.   Continue carvedilol ? ?Chronic kidney disease, stage 3a ([place]) ?- patient has history of CKD3a. Baseline creat ~ 1.2, eGFR 50 ? ? ?Chronic diastolic heart failure (HCC) ?- no s/s exacerbation  ?- Continue carvedilol ? ? ?Old records reviewed in assessment of this patient ? ?Antimicrobials: ?Vancomycin 07/06/2021 >> current ?Zosyn 07/06/2021 >>3/28 ?Rocephin 3/28 >> current ?Flagyl 3/28 >> current  ? ?DVT prophylaxis:  ?enoxaparin (LOVENOX) injection 40 mg Start: 07/07/21 2200 ?SCDs Start: 07/06/21 2347 ? ? ?Code Status:   Code Status: DNR ? ?Disposition Plan:  Likely SNF in 3-4 days ?Status is: Inpt ? ?Objective: ?Blood pressure 122/64, pulse 89, temperature 99.2 ?F (37.3 ?C), resp. rate 20, height 5' (1.524 m), weight 68 kg, SpO2 96 %.  ?Examination:  ?Physical Exam ?Constitutional:   ?   General: She is not in acute distress. ?   Appearance: Normal appearance.  ?HENT:  ?   Head: Normocephalic and atraumatic.  ?   Mouth/Throat:  ?   Mouth: Mucous membranes are moist.  ?Eyes:  ?   Extraocular Movements: Extraocular movements intact.  ?Cardiovascular:  ?   Rate and Rhythm: Normal rate and regular rhythm.  ?Pulmonary:  ?   Effort: Pulmonary effort is normal.  ?   Breath sounds: Normal breath sounds.  ?Abdominal:  ?   General: Bowel sounds are normal. There is no distension.  ?   Palpations: Abdomen is soft.  ?   Tenderness: There is no abdominal tenderness.  ?Musculoskeletal:  ?   Cervical back: Normal range of motion and neck supple.  ?   Comments: Left foot 2nd digit noted with swollen toe with purulent drainage from tip of toe; lower  leg also noted with mild edema and erythema (both improved today)  ?Neurological:  ?   Mental Status: She is alert.  ?  ? ?Consultants:  ?Podiatry ?Vascular Surgery ? ?Procedures:  ? ? ?Data Reviewed: ?Results for orders placed or performed during the hospital encounter of 07/06/21 (from the past 24 hour(s))  ?Glucose, capillary     Status: Abnormal  ? Collection Time: 07/07/21  6:01 PM   ?Result Value Ref Range  ? Glucose-Capillary 267 (H) 70 - 99 mg/dL  ?Glucose, capillary     Status: Abnormal  ? Collection Time: 07/07/21  9:37 PM  ?Result Value Ref Range  ? Glucose-Capillary 153 (H) 70 - 99 mg/dL  ? Comment 1 Notify RN   ?Glucose, capillary     Status: Abnormal  ? Collection Time: 07/08/21 12:58 AM  ?Result Value Ref Range  ? Glucose-Capillary 121 (H) 70 - 99 mg/dL  ? Comment 1 Notify RN   ?Glucose, capillary     Status: Abnormal  ? Collection Time: 07/08/21  3:58 AM  ?Result Value Ref Range  ? Glucose-Capillary 108 (H) 70 - 99 mg/dL  ? Comment 1 Notify RN   ?Basic metabolic panel     Status: Abnormal  ? Collection Time: 07/08/21  5:17 AM  ?Result Value Ref Range  ? Sodium 140 135 - 145 mmol/L  ? Potassium 3.6 3.5 - 5.1 mmol/L  ? Chloride 105 98 - 111 mmol/L  ? CO2 27 22 - 32 mmol/L  ? Glucose, Bld 139 (H) 70 - 99 mg/dL  ? BUN 15 8 - 23 mg/dL  ? Creatinine, Ser 0.93 0.44 - 1.00 mg/dL  ? Calcium 8.1 (L) 8.9 - 10.3 mg/dL  ? GFR, Estimated 59 (L) >60 mL/min  ? Anion gap 8 5 - 15  ?CBC with Differential/Platelet     Status: Abnormal  ? Collection Time: 07/08/21  5:17 AM  ?Result Value Ref Range  ? WBC 9.3 4.0 - 10.5 K/uL  ? RBC 3.77 (L) 3.87 - 5.11 MIL/uL  ? Hemoglobin 11.0 (L) 12.0 - 15.0 g/dL  ? HCT 35.3 (L) 36.0 - 46.0 %  ? MCV 93.6 80.0 - 100.0 fL  ? MCH 29.2 26.0 - 34.0 pg  ? MCHC 31.2 30.0 - 36.0 g/dL  ? RDW 12.6 11.5 - 15.5 %  ? Platelets 168 150 - 400 K/uL  ? nRBC 0.0 0.0 - 0.2 %  ? Neutrophils Relative % 77 %  ? Neutro Abs 7.2 1.7 - 7.7 K/uL  ? Lymphocytes Relative 12 %  ? Lymphs Abs 1.1 0.7 - 4.0 K/uL  ? Monocytes Relative 8 %  ? Monocytes Absolute 0.7 0.1 - 1.0 K/uL  ? Eosinophils Relative 2 %  ? Eosinophils Absolute 0.2 0.0 - 0.5 K/uL  ? Basophils Relative 0 %  ? Basophils Absolute 0.0 0.0 - 0.1 K/uL  ? Immature Granulocytes 1 %  ? Abs Immature Granulocytes 0.07 0.00 - 0.07 K/uL  ?Magnesium     Status: None  ? Collection Time: 07/08/21  5:17 AM  ?Result Value Ref Range  ? Magnesium 1.7  1.7 - 2.4 mg/dL  ?Glucose, capillary     Status: Abnormal  ? Collection Time: 07/08/21  7:41 AM  ?Result Value Ref Range  ? Glucose-Capillary 154 (H) 70 - 99 mg/dL  ?Glucose, capillary     Status: Abnormal  ? Collection Time: 07/08/21 11:20 AM  ?Result Value Ref Range  ? Glucose-Capillary 125 (H) 70 - 99 mg/dL  ?Creatinine, serum  Status: None  ? Collection Time: 07/08/21  2:17 PM  ?Result Value Ref Range  ? Creatinine, Ser 0.74 0.44 - 1.00 mg/dL  ? GFR, Estimated >60 >60 mL/min  ?  ?I have Reviewed nursing notes, Vitals, and Lab results since pt's last encounter. Pertinent lab results : see above ?I have ordered test including BMP, CBC, Mg ?I have reviewed the last note from staff over past 24 hours ?I have discussed pt's care plan and test results with nursing staff, case manager ? ? LOS: 2 days  ? ?Dwyane Dee, MD ?[facility] ?07/08/2021, 4:41 PM ? ?

## 2021-07-08 NOTE — Progress Notes (Signed)
Physical Therapy Evaluation ?Patient Details ?Name: Andrea Lawson ?MRN: [id number] ?DOB: Jun 26, 1931 ?Today's Date: 07/08/2021 ? ?History of Present Illness ? 88 yo female wiht onset of non healing wound on L second toe on plantar surface was admitted on 3/27 for evaluation and vascular treatment.  Pt has osteomyeltiis in toe, pending decision on amputation.  PMHx:  AAA, anemia, B12 defic, cataract, CKD, DM, PN, gout, hypothyroid, HTN, incontinent, lumbar pain, macular degeneration  ?Clinical Impression ? Pt was seen for progression of mobility on side of bed to stand and sidestep.  Follow along with her to work on recovery of strength, standing endurance and gait safety and quality.  Her plan is outlined with acute PT goals, and SNF recommended as pt is home alone with help for housework but is independent to walk and get to meals.  Her preference is the same, and has been to rehab prior to this to understand the goals for home.  Will have a fully accessible environment and has equipment such that no needs are anticipated there. ?   ? ?Recommendations for follow up therapy are one component of a multi-disciplinary discharge planning process, led by the attending physician.  Recommendations may be updated based on patient status, additional functional criteria and insurance authorization. ? ?Follow Up Recommendations Skilled nursing-short term rehab (<3 hours/day) ? ?  ?Assistance Recommended at Discharge Frequent or constant Supervision/Assistance  ?Patient can return home with the following ? A little help with walking and/or transfers;A little help with bathing/dressing/bathroom;Assistance with cooking/housework;Assist for transportation;Help with stairs or ramp for entrance ? ?  ?Equipment Recommendations None recommended by PT  ?Recommendations for Other Services ?    ?  ?Functional Status Assessment Patient has had a recent decline in their functional status and demonstrates the ability to make significant  improvements in function in a reasonable and predictable amount of time.  ? ?  ?Precautions / Restrictions Precautions ?Precautions: Fall ?Precaution Comments: L foot wound ?Required Braces or Orthoses:  (no orthotic shoe ordered) ?Restrictions ?Weight Bearing Restrictions: No  ? ?  ? ?Mobility ? Bed Mobility ?Overal bed mobility: Needs Assistance ?Bed Mobility: Supine to Sit, Sit to Supine ?  ?  ?Supine to sit: Min assist ?Sit to supine: Min assist ?  ?  ?  ? ?Transfers ?Overall transfer level: Needs assistance ?Equipment used: Rolling [place] (2 wheels) ?Transfers: Sit to/from Stand ?Sit to Stand: Min assist ?  ?  ?  ?  ?  ?General transfer comment: assist to stand with cues for foot and hand placement ?  ? ?Ambulation/Gait ?Ambulation/Gait assistance: Min assist ?Gait Distance (Feet): 10 Feet ?Assistive device: Rolling [place] (2 wheels), 1 person hand held assist ?Gait Pattern/deviations: Step-to pattern, Decreased stride length ?Gait velocity: reduced ?Gait velocity interpretation: <1.31 ft/sec, indicative of household ambulator ?  ?General Gait Details: weak appearance of LE's so remained bedside sidestepping ? ?Stairs ?  ?  ?  ?  ?  ? ?Wheelchair Mobility ?  ? ?Modified Rankin (Stroke Patients Only) ?  ? ?  ? ?Balance Overall balance assessment: Needs assistance ?Sitting-balance support: Feet supported, Single extremity supported ?Sitting balance-Leahy Scale: Fair ?Sitting balance - Comments: supporting herself with no drift ?  ?Standing balance support: Bilateral upper extremity supported, During functional activity ?Standing balance-Leahy Scale: Poor ?Standing balance comment: requires guard for standing mobility ?  ?  ?  ?  ?  ?  ?  ?  ?  ?  ?  ?   ? ? ? ?  Pertinent Vitals/Pain Pain Assessment ?Pain Assessment: No/denies pain  ? ? ?Home Living Family/patient expects to be discharged to:: Skilled nursing facility ?  ?  ?  ?  ?  ?  ?  ?  ?  ?   ?  ?Prior Function Prior Level of Function : Needs assist ?  ?  ?   ?Physical Assist : Mobility (physical) ?Mobility (physical): Gait ?  ?Mobility Comments: used SPC to get around in ALF ?ADLs Comments: had food prepared for her and housekeeping ?  ? ? ?Hand Dominance  ? Dominant Hand: Right ? ?  ?Extremity/Trunk Assessment  ? Upper Extremity Assessment ?Upper Extremity Assessment: Overall WFL for tasks assessed ?  ? ?Lower Extremity Assessment ?Lower Extremity Assessment: Generalized weakness ?  ? ?Cervical / Trunk Assessment ?Cervical / Trunk Assessment: Kyphotic  ?Communication  ? Communication: No difficulties  ?Cognition Arousal/Alertness: Awake/alert ?Behavior During Therapy: Flat affect ?Overall Cognitive Status: No family/caregiver present to determine baseline cognitive functioning ?  ?  ?  ?  ?  ?  ?  ?  ?  ?  ?  ?  ?  ?  ?  ?  ?General Comments: giving history to PT but no family to corroborate ?  ?  ? ?  ?General Comments General comments (skin integrity, edema, etc.): pt is able to walk but a bit unpredictable, recommending SNFto restore her independence ? ?  ?Exercises    ? ?Assessment/Plan  ?  ?PT Assessment Patient needs continued PT services  ?PT Problem List Decreased strength;Decreased activity tolerance;Decreased balance;Decreased mobility;Decreased safety awareness;Decreased skin integrity ? ?   ?  ?PT Treatment Interventions DME instruction;Gait training;Functional mobility training;Therapeutic activities;Therapeutic exercise;Balance training;Neuromuscular re-education;Patient/family education   ? ?PT Goals (Current goals can be found in the Care Plan section)  ?Acute Rehab PT Goals ?Patient Stated Goal: to get home and recover strength ?PT Goal Formulation: With patient ?Time For Goal Achievement: 07/22/21 ?Potential to Achieve Goals: Good ? ?  ?Frequency Min 2X/week ?  ? ? ?Co-evaluation   ?  ?  ?  ?  ? ? ?  ?AM-PAC PT "6 Clicks" Mobility  ?Outcome Measure Help needed turning from your back to your side while in a flat bed without using bedrails?: A  Little ?Help needed moving from lying on your back to sitting on the side of a flat bed without using bedrails?: A Little ?Help needed moving to and from a bed to a chair (including a wheelchair)?: A Little ?Help needed standing up from a chair using your arms (e.g., wheelchair or bedside chair)?: A Little ?Help needed to walk in hospital room?: A Lot ?Help needed climbing 3-5 steps with a railing? : Total ?6 Click Score: 15 ? ?  ?End of Session Equipment Utilized During Treatment: Gait belt ?Activity Tolerance: Patient limited by fatigue ?Patient left: in bed;with call bell/phone within reach;with bed alarm set ?Nurse Communication: Mobility status ?PT Visit Diagnosis: Unsteadiness on feet (R26.81);Muscle weakness (generalized) (M62.81);Difficulty in walking, not elsewhere classified (R26.2) ?  ? ?Time: 4970-2637 ?PT Time Calculation (min) (ACUTE ONLY): 26 min ? ? ?Charges:   PT Evaluation ?$PT Eval Moderate Complexity: 1 Mod ?PT Treatments ?$Therapeutic Activity: 8-22 mins ?  ?   ? ?Ramond Dial ?07/08/2021, 12:38 PM ? ?Mee Hives, PT PhD ?Acute Rehab Dept. Number: [facility] [phone] and [place] [phone] ? ?

## 2021-07-08 NOTE — Progress Notes (Signed)
? ?Subjective/Chief Complaint: ?Patient seen.  Somewhat confused.  No specific complaints. ? ? ?Objective: ?Vital signs in last 24 hours: ?Temp:  [97.7 ?F (36.5 ?C)-99.6 ?F (37.6 ?C)] 99.6 ?F (37.6 ?C) (03/29 1119) ?Pulse Rate:  [77-99] 84 (03/29 1119) ?Resp:  [16-19] 19 (03/29 1119) ?BP: (126-170)/(60-83) 136/75 (03/29 1119) ?SpO2:  [94 %-100 %] 98 % (03/29 1119) ?Last BM Date : 07/08/21 ? ?Intake/Output from previous day: ?03/28 0701 - 03/29 0700 ?In: 558.5 [I.V.:158.5; IV Piggyback:400] ?Out: 250 [Urine:250] ?Intake/Output this shift: ?Total I/O ?In: 343 [I.V.:343] ?Out: -  ? ?Left second toe dressing intact. ? ?Lab Results:  ?Recent Labs  ?  07/06/21 ?1947 07/08/21 ?7902  ?WBC 7.8 9.3  ?HGB 11.4* 11.0*  ?HCT 36.8 35.3*  ?PLT 193 168  ? ?BMET ?Recent Labs  ?  07/06/21 ?1947 07/07/21 ?0749 07/08/21 ?4097  ?NA 137  --  140  ?K 3.7  --  3.6  ?CL 99  --  105  ?CO2 26  --  27  ?GLUCOSE 410*  --  139*  ?BUN '20 15 15  '$ ?CREATININE 1.20* 1.01* 0.93  ?CALCIUM 8.9  --  8.1*  ? ?PT/INR ?No results for input(s): LABPROT, INR in the last 72 hours. ?ABG ?No results for input(s): PHART, HCO3 in the last 72 hours. ? ?Invalid input(s): PCO2, PO2 ? ?Studies/Results: ?DG Foot Complete Left ? ?Result Date: 07/06/2021 ?CLINICAL DATA:  Evaluation for osteomyelitis of the second and third left toes. EXAM: LEFT FOOT - COMPLETE 3+ VIEW COMPARISON:  None. FINDINGS: There is no evidence of an fracture or dislocation. Cortical destruction is seen along the distal phalanx of the second left toe. Marked severity diffuse soft tissue swelling of the second left toe is also noted. IMPRESSION: Findings suspicious for acute osteomyelitis involving the distal phalanx of the second left toe. Electronically Signed   By: Virgina Norfolk M.D.   On: 07/06/2021 23:14   ? ?Anti-infectives: ?Anti-infectives (From admission, onward)  ? ? Start     Dose/Rate Route Frequency Ordered Stop  ? 07/09/21 1530  clindamycin (CLEOCIN) IVPB 300 mg       ? 300  mg ?100 mL/hr over 30 Minutes Intravenous  Once 07/08/21 1357    ? 07/08/21 2200  vancomycin (VANCOREADY) IVPB 1250 mg/250 mL  Status:  Discontinued       ? 1,250 mg ?166.7 mL/hr over 90 Minutes Intravenous Every 48 hours 07/07/21 0059 07/07/21 0926  ? 07/07/21 2200  vancomycin (VANCOREADY) IVPB 750 mg/150 mL       ? 750 mg ?150 mL/hr over 60 Minutes Intravenous Every 24 hours 07/07/21 0926    ? 07/07/21 2200  cefTRIAXone (ROCEPHIN) 2 g in sodium chloride 0.9 % 100 mL IVPB       ? 2 g ?200 mL/hr over 30 Minutes Intravenous Every 24 hours 07/07/21 1537    ? 07/07/21 2200  metroNIDAZOLE (FLAGYL) IVPB 500 mg       ? 500 mg ?100 mL/hr over 60 Minutes Intravenous Every 12 hours 07/07/21 1537    ? 07/07/21 0500  piperacillin-tazobactam (ZOSYN) IVPB 3.375 g  Status:  Discontinued       ? 3.375 g ?12.5 mL/hr over 240 Minutes Intravenous Every 8 hours 07/07/21 0126 07/07/21 1536  ? 07/06/21 2300  vancomycin (VANCOREADY) IVPB 1500 mg/300 mL       ? 1,500 mg ?150 mL/hr over 120 Minutes Intravenous  Once 07/06/21 2247 07/07/21 0059  ? 07/06/21 2300  piperacillin-tazobactam (ZOSYN) IVPB 3.375 g       ?  3.375 g ?100 mL/hr over 30 Minutes Intravenous  Once 07/06/21 2247 07/06/21 2325  ? ?  ? ? ?Assessment/Plan: ?s/p Procedure(s): ?AMPUTATION TOE (Left) ?Assessment: Osteomyelitis left second toe. ? ?Plan: Patient scheduled for angiogram hopefully later today, if not tomorrow.  Discussed with the patient that my plan would be for amputation of the toe tomorrow evening.  Discussed possible risks and complications of the procedure including but not limited to inability of the amputation to heal due to her diabetes or vascular status as well as continued infection.  No guarantees could be given as to the outcome of healing potential.  Questions invited and answered.  We will obtain consent for amputation left second toe.  N.p.o. tomorrow after early breakfast tray.  Plan for surgery again tomorrow evening. ? LOS: 2 days  ? ? ?Andrea Lawson ?07/08/2021 ? ?

## 2021-07-09 ENCOUNTER — Encounter: Payer: Self-pay | Admitting: Internal Medicine

## 2021-07-09 ENCOUNTER — Inpatient Hospital Stay: Payer: Medicare Other | Admitting: Anesthesiology

## 2021-07-09 ENCOUNTER — Encounter
Admission: EM | Disposition: A | Discharge status: Skilled Nursing Facility | Payer: Medicare Other | Source: Home / Self Care | Attending: Internal Medicine

## 2021-07-09 ENCOUNTER — Encounter
Admission: EM | Disposition: A | Discharge status: Skilled Nursing Facility | Payer: Self-pay | Source: Home / Self Care | Attending: Internal Medicine

## 2021-07-09 DIAGNOSIS — I70262 Atherosclerosis of native arteries of extremities with gangrene, left leg: Secondary | ICD-10-CM

## 2021-07-09 DIAGNOSIS — M869 Osteomyelitis, unspecified: Secondary | ICD-10-CM | POA: Diagnosis not present

## 2021-07-09 DIAGNOSIS — L97529 Non-pressure chronic ulcer of other part of left foot with unspecified severity: Secondary | ICD-10-CM

## 2021-07-09 DIAGNOSIS — I7092 Chronic total occlusion of artery of the extremities: Secondary | ICD-10-CM

## 2021-07-09 DIAGNOSIS — I701 Atherosclerosis of renal artery: Secondary | ICD-10-CM

## 2021-07-09 HISTORY — PX: LOWER EXTREMITY ANGIOGRAPHY: CATH118251

## 2021-07-09 HISTORY — PX: AMPUTATION TOE: SHX6595

## 2021-07-09 LAB — BASIC METABOLIC PANEL
Anion gap: 11 (ref 5–15)
BUN: 13 mg/dL (ref 8–23)
CO2: 22 mmol/L (ref 22–32)
Calcium: 7.6 mg/dL — ABNORMAL LOW (ref 8.9–10.3)
Chloride: 104 mmol/L (ref 98–111)
Creatinine, Ser: 0.91 mg/dL (ref 0.44–1.00)
GFR, Estimated: >60 mL/min (ref >60)
Glucose, Bld: 297 mg/dL — ABNORMAL HIGH (ref 70–99)
Potassium: 3.3 mmol/L — ABNORMAL LOW (ref 3.5–5.1)
Sodium: 137 mmol/L (ref 135–145)

## 2021-07-09 LAB — CREATININE, SERUM
Creatinine, Ser: 0.73 mg/dL (ref 0.44–1.00)
GFR, Estimated: >60 mL/min (ref >60)

## 2021-07-09 LAB — GLUCOSE, CAPILLARY
Glucose-Capillary: 278 mg/dL — ABNORMAL HIGH (ref 70–99)
Glucose-Capillary: 200 mg/dL — ABNORMAL HIGH (ref 70–99)
Glucose-Capillary: 99 mg/dL (ref 70–99)
Glucose-Capillary: 89 mg/dL (ref 70–99)
Glucose-Capillary: 111 mg/dL — ABNORMAL HIGH (ref 70–99)
Glucose-Capillary: 109 mg/dL — ABNORMAL HIGH (ref 70–99)

## 2021-07-09 LAB — CBC WITH DIFFERENTIAL/PLATELET
Abs Immature Granulocytes: 0.08 10*3/uL — ABNORMAL HIGH (ref 0.00–0.07)
Basophils Absolute: 0 10*3/uL (ref 0.0–0.1)
Basophils Relative: 0 %
Eosinophils Absolute: 0.1 10*3/uL (ref 0.0–0.5)
Eosinophils Relative: 1 %
HCT: 37.3 % (ref 36.0–46.0)
Hemoglobin: 11.4 g/dL — ABNORMAL LOW (ref 12.0–15.0)
Immature Granulocytes: 1 %
Lymphocytes Relative: 10 %
Lymphs Abs: 1 10*3/uL (ref 0.7–4.0)
MCH: 28.8 pg (ref 26.0–34.0)
MCHC: 30.6 g/dL (ref 30.0–36.0)
MCV: 94.2 fL (ref 80.0–100.0)
Monocytes Absolute: 0.7 10*3/uL (ref 0.1–1.0)
Monocytes Relative: 7 %
Neutro Abs: 8.3 10*3/uL — ABNORMAL HIGH (ref 1.7–7.7)
Neutrophils Relative %: 81 %
Platelets: 163 10*3/uL (ref 150–400)
RBC: 3.96 MIL/uL (ref 3.87–5.11)
RDW: 13 % (ref 11.5–15.5)
WBC: 10.2 10*3/uL (ref 4.0–10.5)
nRBC: 0 % (ref 0.0–0.2)

## 2021-07-09 LAB — BUN: BUN: 12 mg/dL (ref 8–23)

## 2021-07-09 LAB — MAGNESIUM: Magnesium: 1.6 mg/dL — ABNORMAL LOW (ref 1.7–2.4)

## 2021-07-09 SURGERY — LOWER EXTREMITY ANGIOGRAPHY
Anesthesia: Moderate Sedation | Laterality: Left

## 2021-07-09 SURGERY — AMPUTATION, TOE
Anesthesia: General | Site: Toe | Laterality: Left

## 2021-07-09 MED ORDER — NEOMYCIN-POLYMYXIN B GU 40-200000 IR SOLN
Status: AC
  Filled 2021-07-09: qty 2

## 2021-07-09 MED ORDER — DIPHENHYDRAMINE HCL 50 MG/ML IJ SOLN: 50.0000 mg | Freq: Once | INTRAMUSCULAR | Status: DC | PRN

## 2021-07-09 MED ORDER — PHENYLEPHRINE 40 MCG/ML (10ML) SYRINGE FOR IV PUSH (FOR BLOOD PRESSURE SUPPORT)
PREFILLED_SYRINGE | INTRAVENOUS | Status: DC | PRN
  Administered 2021-07-09 (×6): 80 ug via INTRAVENOUS
  Administered 2021-07-09: 160 ug via INTRAVENOUS

## 2021-07-09 MED ORDER — MIDAZOLAM HCL 2 MG/ML PO SYRP: 8.0000 mg | ORAL_SOLUTION | Freq: Once | ORAL | Status: DC | PRN

## 2021-07-09 MED ORDER — BUPIVACAINE HCL 0.5 % IJ SOLN
INTRAMUSCULAR | Status: DC | PRN
  Administered 2021-07-09: 10 mL

## 2021-07-09 MED ORDER — CLINDAMYCIN PHOSPHATE 300 MG/50ML IV SOLN
INTRAVENOUS | Status: AC
  Administered 2021-07-09: 300 mg via INTRAVENOUS
  Filled 2021-07-09: qty 50

## 2021-07-09 MED ORDER — FENTANYL CITRATE (PF) 100 MCG/2ML IJ SOLN
INTRAMUSCULAR | Status: DC | PRN
Start: 2021-07-09 — End: 2021-07-09
  Administered 2021-07-09: 25 ug via INTRAVENOUS

## 2021-07-09 MED ORDER — NEOMYCIN-POLYMYXIN B GU 40-200000 IR SOLN
Status: DC | PRN
  Administered 2021-07-09: 2 mL

## 2021-07-09 MED ORDER — CLINDAMYCIN PHOSPHATE 300 MG/50ML IV SOLN: 300.0000 mg | Freq: Once | INTRAVENOUS | Status: DC

## 2021-07-09 MED ORDER — EPHEDRINE SULFATE (PRESSORS) 50 MG/ML IJ SOLN
INTRAMUSCULAR | Status: DC | PRN
  Administered 2021-07-09 (×2): 10 mg via INTRAVENOUS
  Administered 2021-07-09: 5 mg via INTRAVENOUS

## 2021-07-09 MED ORDER — HYDROMORPHONE HCL 1 MG/ML IJ SOLN: 1.0000 mg | Freq: Once | INTRAMUSCULAR | Status: DC | PRN

## 2021-07-09 MED ORDER — FENTANYL CITRATE (PF) 100 MCG/2ML IJ SOLN
INTRAMUSCULAR | Status: DC | PRN
  Administered 2021-07-09: 50 ug via INTRAVENOUS

## 2021-07-09 MED ORDER — MIDAZOLAM HCL 2 MG/2ML IJ SOLN
INTRAMUSCULAR | Status: DC | PRN
  Administered 2021-07-09: .5 mg via INTRAVENOUS
  Administered 2021-07-09: 1 mg via INTRAVENOUS

## 2021-07-09 MED ORDER — FAMOTIDINE 20 MG PO TABS: 40.0000 mg | ORAL_TABLET | Freq: Once | ORAL | Status: DC | PRN

## 2021-07-09 MED ORDER — BUPIVACAINE HCL (PF) 0.5 % IJ SOLN
INTRAMUSCULAR | Status: AC
  Filled 2021-07-09: qty 30

## 2021-07-09 MED ORDER — EPHEDRINE 5 MG/ML INJ
INTRAVENOUS | Status: AC
  Filled 2021-07-09: qty 5

## 2021-07-09 MED ORDER — IODIXANOL 320 MG/ML IV SOLN
INTRAVENOUS | Status: DC | PRN
  Administered 2021-07-09: 40 mL

## 2021-07-09 MED ORDER — METHYLPREDNISOLONE SODIUM SUCC 125 MG IJ SOLR: 125.0000 mg | Freq: Once | INTRAMUSCULAR | Status: DC | PRN

## 2021-07-09 MED ORDER — HEPARIN SODIUM (PORCINE) 1000 UNIT/ML IJ SOLN
INTRAMUSCULAR | Status: AC
  Filled 2021-07-09: qty 10

## 2021-07-09 MED ORDER — SODIUM CHLORIDE 0.9 % IV SOLN: INTRAVENOUS | Status: DC

## 2021-07-09 MED ORDER — DEXMEDETOMIDINE HCL IN NACL 200 MCG/50ML IV SOLN
INTRAVENOUS | Status: DC | PRN
  Administered 2021-07-09: 4 ug via INTRAVENOUS
  Administered 2021-07-09: 8 ug via INTRAVENOUS

## 2021-07-09 MED ORDER — PROPOFOL 500 MG/50ML IV EMUL
INTRAVENOUS | Status: DC | PRN
  Administered 2021-07-09: 100 ug/kg/min via INTRAVENOUS

## 2021-07-09 MED ORDER — MAGNESIUM SULFATE 2 GM/50ML IV SOLN
2.0000 g | Freq: Once | INTRAVENOUS | Status: AC
Start: 2021-07-09 — End: 2021-07-09
  Administered 2021-07-09: 2 g via INTRAVENOUS
  Filled 2021-07-09: qty 50

## 2021-07-09 MED ORDER — ONDANSETRON HCL 4 MG/2ML IJ SOLN
4.0000 mg | Freq: Four times a day (QID) | INTRAMUSCULAR | Status: DC | PRN
Start: 2021-07-09 — End: 2021-07-09

## 2021-07-09 MED ORDER — 0.9 % SODIUM CHLORIDE (POUR BTL) OPTIME
TOPICAL | Status: DC | PRN
  Administered 2021-07-09: 500 mL

## 2021-07-09 MED ORDER — SODIUM CHLORIDE 0.9 % IV SOLN: INTRAVENOUS | Status: DC | PRN

## 2021-07-09 MED ORDER — PROPOFOL 10 MG/ML IV BOLUS
INTRAVENOUS | Status: AC
  Filled 2021-07-09: qty 20

## 2021-07-09 MED ORDER — FENTANYL CITRATE (PF) 100 MCG/2ML IJ SOLN
INTRAMUSCULAR | Status: AC
  Filled 2021-07-09: qty 2

## 2021-07-09 MED ORDER — POTASSIUM CHLORIDE CRYS ER 20 MEQ PO TBCR
40.0000 meq | EXTENDED_RELEASE_TABLET | Freq: Once | ORAL | Status: AC
Start: 2021-07-09 — End: 2021-07-09
  Administered 2021-07-09: 40 meq via ORAL
  Filled 2021-07-09: qty 2

## 2021-07-09 MED ORDER — MIDAZOLAM HCL 2 MG/2ML IJ SOLN
INTRAMUSCULAR | Status: AC
  Filled 2021-07-09: qty 2

## 2021-07-09 SURGICAL SUPPLY — 48 items
BLADE MED AGGRESSIVE (BLADE) ×1 IMPLANT
BLADE OSC/SAGITTAL MD 5.5X18 (BLADE) ×1 IMPLANT
BLADE SURG 15 STRL LF DISP TIS (BLADE) ×2 IMPLANT
BLADE SURG 15 STRL SS (BLADE) ×4
BLADE SURG MINI STRL (BLADE) ×2 IMPLANT
BNDG CONFORM 2 STRL LF (GAUZE/BANDAGES/DRESSINGS) ×2 IMPLANT
BNDG CONFORM 3 STRL LF (GAUZE/BANDAGES/DRESSINGS) ×1 IMPLANT
BNDG ELASTIC 4X5.8 VLCR STR LF (GAUZE/BANDAGES/DRESSINGS) ×2 IMPLANT
BNDG ESMARK 4X12 TAN STRL LF (GAUZE/BANDAGES/DRESSINGS) ×2 IMPLANT
BNDG GAUZE ELAST 4 BULKY (GAUZE/BANDAGES/DRESSINGS) ×2 IMPLANT
CUFF TOURN SGL QUICK 12 (TOURNIQUET CUFF) ×1 IMPLANT
CUFF TOURN SGL QUICK 18X4 (TOURNIQUET CUFF) ×1 IMPLANT
DRAPE FLUOR MINI C-ARM 54X84 (DRAPES) ×1 IMPLANT
DURAPREP 26ML APPLICATOR (WOUND CARE) ×2 IMPLANT
ELECT REM PT RETURN 9FT ADLT (ELECTROSURGICAL) ×2
ELECTRODE REM PT RTRN 9FT ADLT (ELECTROSURGICAL) ×1 IMPLANT
GAUZE SPONGE 4X4 12PLY STRL (GAUZE/BANDAGES/DRESSINGS) ×2 IMPLANT
GAUZE XEROFORM 1X8 LF (GAUZE/BANDAGES/DRESSINGS) ×2 IMPLANT
GLOVE SURG ENC MOIS LTX SZ7.5 (GLOVE) ×2 IMPLANT
GLOVE SURG UNDER LTX SZ8 (GLOVE) ×2 IMPLANT
GOWN STRL REUS W/ TWL LRG LVL3 (GOWN DISPOSABLE) ×2 IMPLANT
GOWN STRL REUS W/TWL LRG LVL3 (GOWN DISPOSABLE) ×4
HANDPIECE VERSAJET DEBRIDEMENT (MISCELLANEOUS) ×2 IMPLANT
IV NS IRRIG 3000ML ARTHROMATIC (IV SOLUTION) ×1 IMPLANT
KIT TURNOVER KIT A (KITS) ×2 IMPLANT
LABEL OR SOLS (LABEL) ×2 IMPLANT
MANIFOLD NEPTUNE II (INSTRUMENTS) ×2 IMPLANT
NDL FILTER BLUNT 18X1 1/2 (NEEDLE) ×1 IMPLANT
NDL HYPO 25X1 1.5 SAFETY (NEEDLE) ×2 IMPLANT
NEEDLE FILTER BLUNT 18X 1/2SAF (NEEDLE) ×1
NEEDLE FILTER BLUNT 18X1 1/2 (NEEDLE) ×1 IMPLANT
NEEDLE HYPO 25X1 1.5 SAFETY (NEEDLE) ×4 IMPLANT
NS IRRIG 500ML POUR BTL (IV SOLUTION) ×2 IMPLANT
PACK EXTREMITY ARMC (MISCELLANEOUS) ×2 IMPLANT
PAD ABD DERMACEA PRESS 5X9 (GAUZE/BANDAGES/DRESSINGS) ×4 IMPLANT
SOL PREP PVP 2OZ (MISCELLANEOUS) ×2
SOLUTION PREP PVP 2OZ (MISCELLANEOUS) ×1 IMPLANT
STOCKINETTE BIAS CUT 4 980044 (GAUZE/BANDAGES/DRESSINGS) ×2 IMPLANT
STOCKINETTE STRL 6IN 960660 (GAUZE/BANDAGES/DRESSINGS) ×2 IMPLANT
STRIP CLOSURE SKIN 1/4X4 (GAUZE/BANDAGES/DRESSINGS) ×2 IMPLANT
SUT ETHILON 3-0 FS-10 30 BLK (SUTURE) ×2
SUT ETHILON 4-0 (SUTURE)
SUT ETHILON 4-0 FS2 18XMFL BLK (SUTURE)
SUTURE EHLN 3-0 FS-10 30 BLK (SUTURE) ×1 IMPLANT
SUTURE ETHLN 4-0 FS2 18XMF BLK (SUTURE) IMPLANT
SWAB CULTURE AMIES ANAERIB BLU (MISCELLANEOUS) ×1 IMPLANT
SYR 10ML LL (SYRINGE) ×2 IMPLANT
WATER STERILE IRR 500ML POUR (IV SOLUTION) ×2 IMPLANT

## 2021-07-09 SURGICAL SUPPLY — 8 items
CATH ANGIO 5F PIGTAIL 65CM (CATHETERS) ×1 IMPLANT
COVER PROBE U/S 5X48 (MISCELLANEOUS) ×1 IMPLANT
DEVICE STARCLOSE SE CLOSURE (Vascular Products) ×1 IMPLANT
PACK ANGIOGRAPHY (CUSTOM PROCEDURE TRAY) ×2 IMPLANT
SHEATH BRITE TIP 5FRX11 (SHEATH) ×1 IMPLANT
SYR MEDRAD MARK 7 150ML (SYRINGE) ×1 IMPLANT
TUBING CONTRAST HIGH PRESS 72 (TUBING) ×1 IMPLANT
WIRE GUIDERIGHT .035X150 (WIRE) ×1 IMPLANT

## 2021-07-09 NOTE — Plan of Care (Signed)
  Problem: Education: Goal: Knowledge of General Education information will improve Description: Including pain rating scale, medication(s)/side effects and non-pharmacologic comfort measures Outcome: Progressing   Problem: Clinical Measurements: Goal: Will remain free from infection Outcome: Progressing   Problem: Clinical Measurements: Goal: Respiratory complications will improve Outcome: Progressing   Problem: Clinical Measurements: Goal: Cardiovascular complication will be avoided Outcome: Progressing   Problem: Safety: Goal: Ability to remain free from injury will improve Outcome: Progressing   

## 2021-07-09 NOTE — Progress Notes (Addendum)
3710 ?Report given at this time to [place] in day surgery. Plan for surgery today around 430pm. Pt is NPO ? ?6269 ?Verbal order from dr Sabino Gasser ok for pt to take coreg PO and Potassium PO with sips of water.  ? ?4854 ?Consent signed for left 2nd toe amputation later today this evening. This nurse witness pt signature.  ? ?1226 ?Nurse informed by Special procedures nurse Juliann Pulse that pt angiogram has been pumped down to 330-4pm today. Nurse will inform pt ? ?1420 ?Report given to [facility] in vascular, pt will transport down to angio shortly ? ?1517 ?Pt off floor transported to vascular ? ?1630 ?Received report from [place] in angio pt well be transported back to room within 43mns ? ?1700 ?Angio RN and OR tech with this nurse in hallway at bedside with pt. Pt being transferred down to OR at this time  ?

## 2021-07-09 NOTE — Anesthesia Preprocedure Evaluation (Signed)
Anesthesia Evaluation  Patient identified by MRN, date of birth, ID band Patient awake    Reviewed: Allergy & Precautions, NPO status , Patient's Chart, lab work & pertinent test results  History of Anesthesia Complications Negative for: history of anesthetic complications  Airway Mallampati: III  TM Distance: <3 FB Neck ROM: full    Dental  (+) Chipped, Poor Dentition, Missing   Pulmonary neg shortness of breath, former smoker,    Pulmonary exam normal        Cardiovascular hypertension, (-) angina(-) Past MI Normal cardiovascular exam     Neuro/Psych  Neuromuscular disease negative psych ROS   GI/Hepatic Neg liver ROS, GERD  Controlled,  Endo/Other  diabetes, Type 2Hypothyroidism   Renal/GU Renal disease  negative genitourinary   Musculoskeletal   Abdominal   Peds  Hematology negative hematology ROS (+)   Anesthesia Other Findings Past Medical History: No date: AAA (abdominal aortic aneurysm) No date: Allergic state No date: Anemia     Comment:  IRON DEF.ANEMIA No date: Arthritis     Comment:  OSTEOARTHRITIS OF BIL.KNEES No date: B12 deficiency No date: Basal cell carcinoma No date: Callus of foot     Comment:  bil. 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 No date: Chronic diastolic heart failure (HCC) No date: Chronic kidney disease, stage 3a (HCC) No date: Diabetes mellitus without complication (HCC) No date: Diabetic neuropathy ([place]) No date: Diverticulosis No date: Drug-induced myopathy No date: Eczema No date: GERD (gastroesophageal reflux disease) No date: Gout No date: Hemorrhoids No date: History of chicken pox No date: History of hypercalcemia No date: Hypercholesterolemia No date: Hyperparathyroidism ([place]) No date: Hypertension No date: Hypothyroidism No date: Incontinent of  urine No date: Lumbago No date: Mycotic toenails No date: Neuropathy due to secondary diabetes mellitus ([place]) No date: Wet senile macular degeneration [facility])  Past Surgical History: 2006: BUNIONECTOMY No date: COLONOSCOPY 12/09/2014: COLONOSCOPY WITH PROPOFOL; N/A     Comment:  Procedure: COLONOSCOPY WITH PROPOFOL;  Surgeon: Lollie Sails, MD;  Location: [facility] ENDOSCOPY;  Service:               Endoscopy;  Laterality: N/A; 05/03/2017: COLONOSCOPY WITH PROPOFOL; N/A     Comment:  Procedure: COLONOSCOPY WITH PROPOFOL;  Surgeon:               Lollie Sails, MD;  Location: [facility] ENDOSCOPY;                Service: Endoscopy;  Laterality: N/A; No date: DEBRIDEMENT OF CALLUSES; Bilateral     Comment:  FEET No date: DIAGNOSTIC LAPAROSCOPY No date: ESOPHAGOGASTRODUODENOSCOPY No date: EYE SURGERY No date: GOUT SURGERY No date: KNEE ARTHROSCOPY 03/16/2010: LAPAROSCOPIC PARTIAL COLECTOMY; Right 12/2008: MOHS SURGERY No date: MOUTH SURGERY 2001: PARATHYROIDECTOMY No date: TONSILLECTOMY  BMI    Body Mass Index: 29.29 kg/m      Reproductive/Obstetrics negative OB ROS                             Anesthesia Physical Anesthesia Plan  ASA: 3  Anesthesia Plan: General   Post-op Pain Management:    Induction: Intravenous  PONV Risk Score and Plan: Propofol infusion and TIVA  Airway Management Planned: Natural Airway and Nasal Cannula  Additional Equipment:   Intra-op Plan:   Post-operative Plan:   Informed Consent: I have reviewed the patients History and Physical, chart, labs and discussed the procedure including the risks, benefits and alternatives for the proposed anesthesia with the patient or authorized representative who has indicated his/her understanding and acceptance.   Patient has DNR.  Discussed DNR with patient and Suspend DNR.   Dental Advisory Given  Plan Discussed with: Anesthesiologist, CRNA and  Surgeon  Anesthesia Plan Comments: (Patient consented for risks of anesthesia including but not limited to:  - adverse reactions to medications - risk of airway placement if required - damage to eyes, teeth, lips or other oral mucosa - nerve damage due to positioning  - sore throat or hoarseness - Damage to heart, brain, nerves, lungs, other parts of body or loss of life  Patient voiced understanding.)        Anesthesia Quick Evaluation

## 2021-07-09 NOTE — Interval H&P Note (Signed)
History and Physical Interval Note: ? ?07/09/2021 ?4:58 PM ? ?Andrea Lawson  has presented today for surgery, with the diagnosis of osteomyelitis.  The various methods of treatment have been discussed with the patient and family. After consideration of risks, benefits and other options for treatment, the patient has consented to  Procedure(s): ?AMPUTATION LEFT 2nd TOE (Left) as a surgical intervention.  The patient's history has been reviewed, patient examined, no change in status, stable for surgery.  I have reviewed the patient's chart and labs.  Questions were answered to the patient's satisfaction.   ? ? ?Durward Fortes ? ? ?

## 2021-07-09 NOTE — Progress Notes (Signed)
Per instruction pt can have early breakfast per surgery Andrea Lawson. Pt ate creals with milk at 0636. Specials called and made aware and states will need pt to be NPO for another 6 hours. Will notify incoming shift. Will continue to monitor. ?

## 2021-07-09 NOTE — Progress Notes (Signed)
PT Cancellation Note ? ?Patient Details ?Name: Andrea Lawson ?MRN: [id number] ?DOB: January 24, 1932 ? ? ?Cancelled Treatment:    Reason Eval/Treat Not Completed: Patient at procedure or test/unavailable.  Chart reviewed and attempted to see pt.  Pt currently off floor for procedure at this time.  Will attempt to see tomorrow s/p surgical intervention.  Updated notes for weight bearing precautions following surgery via secure chat with MD.   ? ? ?Gwenlyn Saran, PT, DPT ?07/09/21, 3:29 PM ? ?

## 2021-07-09 NOTE — Progress Notes (Signed)
?Progress Note ? ? ? ?Andrea Lawson   ?SHF:[id number]  ?DOB: 1932-01-06  ?DOA: 07/06/2021     3 ?PCP: Venia Carbon, MD ? ?Initial CC: toe infection ? ?Hospital Course: ?[name] is an 88 yo female with PMH chronic dCHF, CKD3a, DMII, GERD, HLD, hyperparathyroidism, HTN, hypothyroidism, diabetic neuropathy, macular degeneration who presented to the hospital after referral from her podiatrist office due to concern for osteomyelitis involving her left foot second digit.  She was started on vancomycin and Zosyn.  She was evaluated by podiatry with tentative plans for second toe amputation during hospitalization.  ? ?Interval History:  ?No events overnight. Understands plan for surgery today. She had no questions this morning.  ? ?Assessment and Plan: ?* Osteomyelitis of second toe of left foot ([place]) ?- Continue vancomycin; d/c zosyn (given CKD3, would like to avoid increased risk of renal toxicity with vanc/zosyn combo) ?- changed zosyn to rocephin and flagyl; if clinical worsening can consider cefepime for pseudomonal coverage but holding off for now ?- informed by Dr. Silvio Pate on 3/30 that outpt culture noted with E. Faecalis (sens to amp and vanc). We'll narrow after surgery  ?- appreciate podiatry consult; undergoing toe amputation today ?- follow up vascular surgery consult as well (tentative plan for angiogram, timing u/k) ?- continue pain control ? ?Lactic acidosis ?- s/p IVF with good response ? ?Type 2 diabetes mellitus with other circulatory complications (HCC) ?- F0Y 10% ?- resume basal insulin; will adjust as needed ?-Metformin on hold during hospitalization, but will max dose at discharge after clarifying if she is tolerating ?- continue SSI and CBG monitoring  ?- greatly appreciate diabetes coordinator note; agree that ALF needs to check CBG at least daily and also administer insulin or at least observe/monitor/educate patient on proper administration  ? ?Chronic anemia ?- Baseline approximately 10 to 11  g/dL ?- Currently at baseline ? ?HTN (hypertension) ?Controlled.  Continue carvedilol ? ?Chronic kidney disease, stage 3a ([place]) ?- patient has history of CKD3a. Baseline creat ~ 1.2, eGFR 50 ? ? ?Chronic diastolic heart failure (HCC) ?- no s/s exacerbation  ?- Continue carvedilol ? ? ?Old records reviewed in assessment of this patient ? ?Antimicrobials: ?Vancomycin 07/06/2021 >> current ?Zosyn 07/06/2021 >>3/28 ?Rocephin 3/28 >> current ?Flagyl 3/28 >> current  ? ?DVT prophylaxis:  ?enoxaparin (LOVENOX) injection 40 mg Start: 07/07/21 2200 ?SCDs Start: 07/06/21 2347 ? ? ?Code Status:   Code Status: DNR ? ?Disposition Plan:  Likely SNF in 3-4 days ?Status is: Inpt ? ?Objective: ?Blood pressure 128/69, pulse 79, temperature 97.8 ?F (36.6 ?C), temperature source Oral, resp. rate (!) 21, height 5' (1.524 m), weight 68 kg, SpO2 97 %.  ?Examination:  ?Physical Exam ?Constitutional:   ?   General: She is not in acute distress. ?   Appearance: Normal appearance.  ?HENT:  ?   Head: Normocephalic and atraumatic.  ?   Mouth/Throat:  ?   Mouth: Mucous membranes are moist.  ?Eyes:  ?   Extraocular Movements: Extraocular movements intact.  ?Cardiovascular:  ?   Rate and Rhythm: Normal rate and regular rhythm.  ?Pulmonary:  ?   Effort: Pulmonary effort is normal.  ?   Breath sounds: Normal breath sounds.  ?Abdominal:  ?   General: Bowel sounds are normal. There is no distension.  ?   Palpations: Abdomen is soft.  ?   Tenderness: There is no abdominal tenderness.  ?Musculoskeletal:  ?   Cervical back: Normal range of motion and neck supple.  ?  Comments: Left foot 2nd digit noted with swollen toe with purulent drainage from tip of toe; lower leg also noted with mild edema and erythema (both improved today)  ?Neurological:  ?   Mental Status: She is alert.  ?  ? ?Consultants:  ?Podiatry ?Vascular Surgery ? ?Procedures:  ? ? ?Data Reviewed: ?Results for orders placed or performed during the hospital encounter of 07/06/21 (from the  past 24 hour(s))  ?Glucose, capillary     Status: Abnormal  ? Collection Time: 07/08/21  6:56 PM  ?Result Value Ref Range  ? Glucose-Capillary 158 (H) 70 - 99 mg/dL  ?Glucose, capillary     Status: Abnormal  ? Collection Time: 07/08/21  9:19 PM  ?Result Value Ref Range  ? Glucose-Capillary 157 (H) 70 - 99 mg/dL  ?Basic metabolic panel     Status: Abnormal  ? Collection Time: 07/09/21  7:22 AM  ?Result Value Ref Range  ? Sodium 137 135 - 145 mmol/L  ? Potassium 3.3 (L) 3.5 - 5.1 mmol/L  ? Chloride 104 98 - 111 mmol/L  ? CO2 22 22 - 32 mmol/L  ? Glucose, Bld 297 (H) 70 - 99 mg/dL  ? BUN 13 8 - 23 mg/dL  ? Creatinine, Ser 0.91 0.44 - 1.00 mg/dL  ? Calcium 7.6 (L) 8.9 - 10.3 mg/dL  ? GFR, Estimated >60 >60 mL/min  ? Anion gap 11 5 - 15  ?CBC with Differential/Platelet     Status: Abnormal  ? Collection Time: 07/09/21  7:22 AM  ?Result Value Ref Range  ? WBC 10.2 4.0 - 10.5 K/uL  ? RBC 3.96 3.87 - 5.11 MIL/uL  ? Hemoglobin 11.4 (L) 12.0 - 15.0 g/dL  ? HCT 37.3 36.0 - 46.0 %  ? MCV 94.2 80.0 - 100.0 fL  ? MCH 28.8 26.0 - 34.0 pg  ? MCHC 30.6 30.0 - 36.0 g/dL  ? RDW 13.0 11.5 - 15.5 %  ? Platelets 163 150 - 400 K/uL  ? nRBC 0.0 0.0 - 0.2 %  ? Neutrophils Relative % 81 %  ? Neutro Abs 8.3 (H) 1.7 - 7.7 K/uL  ? Lymphocytes Relative 10 %  ? Lymphs Abs 1.0 0.7 - 4.0 K/uL  ? Monocytes Relative 7 %  ? Monocytes Absolute 0.7 0.1 - 1.0 K/uL  ? Eosinophils Relative 1 %  ? Eosinophils Absolute 0.1 0.0 - 0.5 K/uL  ? Basophils Relative 0 %  ? Basophils Absolute 0.0 0.0 - 0.1 K/uL  ? Immature Granulocytes 1 %  ? Abs Immature Granulocytes 0.08 (H) 0.00 - 0.07 K/uL  ?Magnesium     Status: Abnormal  ? Collection Time: 07/09/21  7:22 AM  ?Result Value Ref Range  ? Magnesium 1.6 (L) 1.7 - 2.4 mg/dL  ?Glucose, capillary     Status: Abnormal  ? Collection Time: 07/09/21  7:45 AM  ?Result Value Ref Range  ? Glucose-Capillary 278 (H) 70 - 99 mg/dL  ?Glucose, capillary     Status: Abnormal  ? Collection Time: 07/09/21 11:44 AM  ?Result Value Ref  Range  ? Glucose-Capillary 200 (H) 70 - 99 mg/dL  ?BUN     Status: None  ? Collection Time: 07/09/21  2:51 PM  ?Result Value Ref Range  ? BUN 12 8 - 23 mg/dL  ?Creatinine, serum     Status: None  ? Collection Time: 07/09/21  2:51 PM  ?Result Value Ref Range  ? Creatinine, Ser 0.73 0.44 - 1.00 mg/dL  ? GFR, Estimated >60 >60 mL/min  ?  Glucose, capillary     Status: None  ? Collection Time: 07/09/21  3:28 PM  ?Result Value Ref Range  ? Glucose-Capillary 99 70 - 99 mg/dL  ?  ?I have Reviewed nursing notes, Vitals, and Lab results since pt's last encounter. Pertinent lab results : see above ?I have ordered test including BMP, CBC, Mg ?I have reviewed the last note from staff over past 24 hours ?I have discussed pt's care plan and test results with nursing staff, case manager ? ? LOS: 3 days  ? ?Dwyane Dee, MD ?[facility] ?07/09/2021, 4:21 PM ? ?

## 2021-07-09 NOTE — Transfer of Care (Signed)
Immediate Anesthesia Transfer of Care Note ? ?Patient: Andrea Lawson ? ?Procedure(s) Performed: AMPUTATION LEFT 2nd TOE (Left: Toe) ? ?Patient Location: PACU ? ?Anesthesia Type:MAC ? ?Level of Consciousness: drowsy ? ?Airway & Oxygen Therapy: Patient Spontanous Breathing and Patient connected to face mask oxygen ? ?Post-op Assessment: Report given to RN and Post -op Vital signs reviewed and stable ? ?Post vital signs: Reviewed and stable ? ?Last Vitals:  ?Vitals Value Taken Time  ?BP 105/55 07/09/21 1917  ?Temp    ?Pulse 77 07/09/21 1919  ?Resp 20 07/09/21 1919  ?SpO2 99 % 07/09/21 1919  ?Vitals shown include unvalidated device data. ? ?Last Pain:  ?Vitals:  ? 07/09/21 1714  ?TempSrc: Temporal  ?PainSc: 2   ?   ? ?  ? ?Complications: No notable events documented. ?

## 2021-07-09 NOTE — Anesthesia Postprocedure Evaluation (Signed)
Anesthesia Post Note ? ?Patient: TALLIE DODDS ? ?Procedure(s) Performed: AMPUTATION LEFT 2nd TOE (Left: Toe) ? ?Patient location during evaluation: PACU ?Anesthesia Type: General ?Level of consciousness: awake and alert ?Pain management: pain level controlled ?Vital Signs Assessment: post-procedure vital signs reviewed and stable ?Respiratory status: spontaneous breathing, nonlabored ventilation, respiratory function stable and patient connected to nasal cannula oxygen ?Cardiovascular status: blood pressure returned to baseline and stable ?Postop Assessment: no apparent nausea or vomiting ?Anesthetic complications: no ? ? ?No notable events documented. ? ? ?Last Vitals:  ?Vitals:  ? 07/09/21 1930 07/09/21 1940  ?BP: 110/64 119/67  ?Pulse: 83 79  ?Resp: 17 18  ?Temp: 37.1 ?C 36.6 ?C  ?SpO2: 96% 93%  ?  ?Last Pain:  ?Vitals:  ? 07/09/21 1940  ?TempSrc:   ?PainSc: 0-No pain  ? ? ?  ?  ?  ?  ?  ?  ? ?Precious Haws Reymundo Winship ? ? ? ? ?

## 2021-07-09 NOTE — Op Note (Signed)
Date of operation: 07/09/2021. ? ?Surgeon: Durward Fortes D.P.M. ? ?Preoperative diagnosis: Osteomyelitis left second toe. ? ?Postoperative diagnosis: Same. ? ?Procedure: Amputation left second toe metatarsophalangeal joint. ? ?Anesthesia: Local MAC. ? ?Hemostasis: Esmarch tourniquet left ankle. ? ?Estimated blood loss: Less than 5 cc. ? ?Cultures: Bone culture distal left second toe. ? ?Pathology: Left second toe. ? ?Complications: None apparent. ? ?Operative indications: This is an 88 year old female with chronic ulceration on the left second toe with recent development of osteomyelitis.  Admitted for IV antibiotics and definitive treatment. ? ?Operative procedure: Patient was taken to the operating room and placed on the table in the supine position.  Following satisfactory sedation the left foot was anesthetized with 10 cc of 0.5% Marcaine plain around the second metatarsal.  The foot was then prepped and draped in usual sterile fashion.  Foot was exsanguinated using an Esmarch which was then left around the ankle to act as a tourniquet.  Attention was directed to the left foot where an incision was made from dorsal to plantar around the medial and lateral base of the second toe.  Incision carried sharply down to the level of bone and dissection carried back proximally to the joint where the toe was disarticulated and removed in toto.  Sample of bone from the distal and middle phalanx was taken for bone culture and the toe passed off for pathology.  The wound was flushed with copious amounts of sterile saline and closed using 3-0 nylon simple interrupted sutures.  Xeroform 4 x 4's ABD and Kerlix applied to the left lower extremity and the tourniquet was released.  Ace wrap applied.  Patient was awakened and transported the PACU with vital signs stable and in good condition having tolerated the anesthesia and procedures well. ?

## 2021-07-09 NOTE — Op Note (Signed)
[facility] ? Percutaneous Study/Intervention Procedural Note ? ? ?Date of Surgery: 07/09/2021 ? ?Surgeon(s):Brandun Pinn   ? ?Assistants:none ? ?Pre-operative Diagnosis: PAD with gangrene left foot ? ?Post-operative diagnosis:  Same ? ?Procedure(s) Performed: ?            1.  Ultrasound guidance for vascular access right femoral artery ?            2.  Catheter placement into left superficial femoral artery from right femoral approach ?            3.  Aortogram and selective left lower extremity angiogram ?            4.  StarClose closure device right femoral artery ? ?EBL: 3 cc ? ?Contrast: 40 cc ? ?Fluoro Time: 1.3 minutes ? ?Moderate Conscious Sedation Time: approximately 14 minutes using 1.5 mg of Versed and 25 mcg of Fentanyl ?             ?Indications:  Patient is a 88 y.o.female with nonhealing ulceration and gangrenous changes to toes on the left foot. The patient is brought in for angiography for further evaluation and potential treatment.  Due to the limb threatening nature of the situation, angiogram was performed for attempted limb salvage. The patient is aware that if the procedure fails, amputation would be expected.  The patient also understands that even with successful revascularization, amputation may still be required due to the severity of the situation.  Risks and benefits are discussed and informed consent is obtained.  ? ?Procedure:  The patient was identified and appropriate procedural time out was performed.  The patient was then placed supine on the table and prepped and draped in the usual sterile fashion. Moderate conscious sedation was administered during a face to face encounter with the patient throughout the procedure with my supervision of the RN administering medicines and monitoring the patient's vital signs, pulse oximetry, telemetry and mental status throughout from the start of the procedure until the patient was taken to the recovery room. Ultrasound was  used to evaluate the right common femoral artery.  It was patent .  A digital ultrasound image was acquired.  A Seldinger needle was used to access the right common femoral artery under direct ultrasound guidance and a permanent image was performed.  A 0.035 J wire was advanced without resistance and a 5Fr sheath was placed.  Pigtail catheter was placed into the aorta and an AP aortogram was performed. This demonstrated 2 right renal arteries with the larger of the 2 being highly stenotic.  The left renal artery origin was not well seen due to the SMA overlying.  The aorta and iliac arteries were widely patent without significant stenosis. I then crossed the aortic bifurcation and advanced to the left femoral head.  I advanced into the mid left SFA to help opacify distally.  Selective left lower extremity angiogram was then performed. This demonstrated fairly normal common femoral artery, profunda femoris artery, superficial femoral and popliteal arteries.  There was a typical tibial trifurcation with a chronic occlusion of the anterior tibial artery that did not reconstitute distally.  The posterior tibial artery is very large and filled the foot quite well.  The peroneal artery provided a second runoff vessel distally and did not have focal stenosis.  No intervention was required.  I elected to terminate the procedure. The sheath was removed and StarClose closure device was deployed in the right femoral artery with excellent hemostatic result.  The patient was taken to the recovery room in stable condition having tolerated the procedure well. ? ?Findings:   ?            Aortogram: 2 right renal arteries with the larger of the 2 being highly stenotic.  The left renal artery origin was not well seen due to the SMA overlying.  The aorta and iliac arteries were widely patent without significant stenosis. ?            Left lower Extremity:  This demonstrated fairly normal common femoral artery, profunda femoris artery,  superficial femoral and popliteal arteries.  There was a typical tibial trifurcation with a chronic occlusion of the anterior tibial artery that did not reconstitute distally.  The posterior tibial artery is very large and filled the foot quite well.  The peroneal artery provided a second runoff vessel distally and did not have focal stenosis. ? ? ?Disposition: Patient was taken to the recovery room in stable condition having tolerated the procedure well. ? ?Complications: None ? ?Leotis Pain ?07/09/2021 ?4:32 PM ? ? ?This note was created with Dragon Medical transcription system. Any errors in dictation are purely unintentional.  ?

## 2021-07-10 ENCOUNTER — Encounter: Payer: Self-pay | Admitting: Podiatry

## 2021-07-10 DIAGNOSIS — M869 Osteomyelitis, unspecified: Secondary | ICD-10-CM | POA: Diagnosis not present

## 2021-07-10 LAB — CBC WITH DIFFERENTIAL/PLATELET
Abs Immature Granulocytes: 0.12 10*3/uL — ABNORMAL HIGH (ref 0.00–0.07)
Basophils Absolute: 0 10*3/uL (ref 0.0–0.1)
Basophils Relative: 0 %
Eosinophils Absolute: 0.3 10*3/uL (ref 0.0–0.5)
Eosinophils Relative: 2 %
HCT: 38.7 % (ref 36.0–46.0)
Hemoglobin: 11.7 g/dL — ABNORMAL LOW (ref 12.0–15.0)
Immature Granulocytes: 1 %
Lymphocytes Relative: 9 %
Lymphs Abs: 1.1 10*3/uL (ref 0.7–4.0)
MCH: 28.6 pg (ref 26.0–34.0)
MCHC: 30.2 g/dL (ref 30.0–36.0)
MCV: 94.6 fL (ref 80.0–100.0)
Monocytes Absolute: 0.9 10*3/uL (ref 0.1–1.0)
Monocytes Relative: 7 %
Neutro Abs: 10.6 10*3/uL — ABNORMAL HIGH (ref 1.7–7.7)
Neutrophils Relative %: 81 %
Platelets: 185 10*3/uL (ref 150–400)
RBC: 4.09 MIL/uL (ref 3.87–5.11)
RDW: 13.1 % (ref 11.5–15.5)
WBC: 13 10*3/uL — ABNORMAL HIGH (ref 4.0–10.5)
nRBC: 0 % (ref 0.0–0.2)

## 2021-07-10 LAB — BASIC METABOLIC PANEL
Anion gap: 10 (ref 5–15)
BUN: 10 mg/dL (ref 8–23)
CO2: 22 mmol/L (ref 22–32)
Calcium: 7.8 mg/dL — ABNORMAL LOW (ref 8.9–10.3)
Chloride: 106 mmol/L (ref 98–111)
Creatinine, Ser: 0.77 mg/dL (ref 0.44–1.00)
GFR, Estimated: >60 mL/min (ref >60)
Glucose, Bld: 106 mg/dL — ABNORMAL HIGH (ref 70–99)
Potassium: 3.3 mmol/L — ABNORMAL LOW (ref 3.5–5.1)
Sodium: 138 mmol/L (ref 135–145)

## 2021-07-10 LAB — GLUCOSE, CAPILLARY
Glucose-Capillary: 122 mg/dL — ABNORMAL HIGH (ref 70–99)
Glucose-Capillary: 191 mg/dL — ABNORMAL HIGH (ref 70–99)
Glucose-Capillary: 197 mg/dL — ABNORMAL HIGH (ref 70–99)
Glucose-Capillary: 184 mg/dL — ABNORMAL HIGH (ref 70–99)

## 2021-07-10 LAB — MAGNESIUM: Magnesium: 1.9 mg/dL (ref 1.7–2.4)

## 2021-07-10 MED ORDER — POTASSIUM CHLORIDE CRYS ER 20 MEQ PO TBCR
40.0000 meq | EXTENDED_RELEASE_TABLET | Freq: Once | ORAL | Status: AC
  Administered 2021-07-10: 40 meq via ORAL
  Filled 2021-07-10: qty 2

## 2021-07-10 MED ORDER — DIPHENHYDRAMINE HCL 50 MG/ML IJ SOLN: 12.5000 mg | Freq: Three times a day (TID) | INTRAMUSCULAR | Status: DC | PRN

## 2021-07-10 MED ORDER — AMOXICILLIN 500 MG PO CAPS
500.0000 mg | ORAL_CAPSULE | Freq: Three times a day (TID) | ORAL | Status: DC
  Administered 2021-07-10 – 2021-07-11 (×4): 500 mg via ORAL
  Filled 2021-07-10 (×4): qty 1

## 2021-07-10 NOTE — NC FL2 (Signed)
?[place] MEDICAID FL2 LEVEL OF CARE SCREENING TOOL  ?  ? ?IDENTIFICATION  ?Patient Name: ?Andrea Lawson Birthdate: Apr 02, 1932 Sex: female Admission Date (Current Location): ?07/06/2021  ?[place] and [place] Number: ? [place] ?  Facility and Address:  ?[facility], [address], [place], [place] [address] ?     Provider Number: ?[id number]  ?Attending Physician Name and Address:  ?Dwyane Dee, MD ? Relative Name and Phone Number:  ?  ?   ?Current Level of Care: ?Hospital Recommended Level of Care: ?[place] Prior Approval Number: ?  ? ?Date Approved/Denied: ?  PASRR Number: ?[id number] A ? ?Discharge Plan: ?SNF ?  ? ?Current Diagnoses: ?Patient Active Problem List  ? Diagnosis Date Noted  ? Osteomyelitis of second toe of left foot ([place]) 07/06/2021  ? Lactic acidosis 07/06/2021  ? Aortic atherosclerosis ([place]) 05/29/2021  ? Chronic anemia 05/29/2021  ? Thrombocytopenia ([place]) 05/29/2021  ? HTN (hypertension) 12/05/2020  ? Abdominal hernia 12/05/2020  ? Type 2 diabetes mellitus with other circulatory complications ([place]) 56/21/3086  ? Chronic diastolic heart failure ([place])   ? GERD (gastroesophageal reflux disease)   ? Chronic kidney disease, stage 3a ([place])   ? Wet senile macular degeneration ([place])   ? AAA (abdominal aortic aneurysm)   ? ? ?Orientation RESPIRATION BLADDER Height & Weight   ?  ?Self, Time, Situation, Place ? Normal Continent, External catheter Weight: 150 lb (68 kg) ?Height:  5' (152.4 cm)  ?BEHAVIORAL SYMPTOMS/MOOD NEUROLOGICAL BOWEL NUTRITION STATUS  ? (None)  (None) Continent Diet (Carb modified)  ?AMBULATORY STATUS COMMUNICATION OF NEEDS Skin   ?Limited Assist Verbally Surgical wounds (Incision left foot: Compression wrap) ?  ?  ?  ?    ?     ?     ? ? ?Personal Care Assistance Level of Assistance  ?    ?  ?  ?   ? ?Functional Limitations Info  ?Sight, Hearing, Speech Sight Info: Adequate ?Hearing Info: Adequate ?Speech Info: Adequate  ? ? ?SPECIAL CARE  FACTORS FREQUENCY  ?PT (By licensed PT)   ?  ?PT Frequency: 5 x week ?  ?  ?  ?  ?   ? ? ?Contractures Contractures Info: Not present  ? ? ?Additional Factors Info  ?Code Status, Allergies Code Status Info: DNR ?Allergies Info: Simvastatin, Ace Inhibitors, Angiotensin Receptor Blockers, Lactose, Losartan, Penicillins ?  ?  ?  ?   ? ?Current Medications (07/10/2021):  This is the current hospital active medication list ?Current Facility-Administered Medications  ?Medication Dose Route Frequency Provider Last Rate Last Admin  ? acetaminophen (TYLENOL) tablet 650 mg  650 mg Oral Q6H PRN Sharlotte Alamo, DPM      ? Or  ? acetaminophen (TYLENOL) suppository 650 mg  650 mg Rectal Q6H PRN Sharlotte Alamo, DPM      ? amoxicillin (AMOXIL) capsule 500 mg  500 mg Oral Lenise Arena, MD   500 mg at 07/10/21 1325  ? carvedilol (COREG) tablet 6.25 mg  6.25 mg Oral BID WC Sharlotte Alamo, DPM   6.25 mg at 07/10/21 5784  ? diphenhydrAMINE (BENADRYL) injection 12.5 mg  12.5 mg Intravenous Q8H PRN Dwyane Dee, MD      ? enoxaparin (LOVENOX) injection 40 mg  40 mg Subcutaneous Daily Sharlotte Alamo, DPM   40 mg at 07/09/21 2223  ? HYDROcodone-acetaminophen (NORCO/VICODIN) 5-325 MG per tablet 1-2 tablet  1-2 tablet Oral Q4H PRN Sharlotte Alamo, DPM      ? HYDROmorphone (DILAUDID)  injection 1 mg  1 mg Intravenous Once PRN Sharlotte Alamo, DPM      ? insulin aspart (novoLOG) injection 0-15 Units  0-15 Units Subcutaneous TID WC Sharlotte Alamo, DPM   3 Units at 07/10/21 1326  ? insulin aspart (novoLOG) injection 0-5 Units  0-5 Units Subcutaneous QHS Sharlotte Alamo, DPM      ? insulin glargine-yfgn [facility]) injection 5 Units  5 Units Subcutaneous QHS Sharlotte Alamo, DPM   5 Units at 07/09/21 2223  ? latanoprost (XALATAN) 0.005 % ophthalmic solution 1 drop  1 drop Both Eyes QPM Sharlotte Alamo, DPM   1 drop at 07/08/21 2224  ? ondansetron (ZOFRAN) tablet 4 mg  4 mg Oral Q6H PRN Sharlotte Alamo, DPM      ? Or  ? ondansetron (ZOFRAN) injection 4 mg  4 mg Intravenous Q6H PRN  Sharlotte Alamo, DPM      ? ? ? ?Discharge Medications: ?Please see discharge summary for a list of discharge medications. ? ?Relevant Imaging Results: ? ?Relevant Lab Results: ? ? ?Additional Information ?SS#: [id number] ? ?Candie Chroman, LCSW ? ? ? ? ?

## 2021-07-10 NOTE — Progress Notes (Signed)
PT Cancellation Note ? ?Patient Details ?Name: Andrea Lawson ?MRN: [id number] ?DOB: 1931/09/09 ? ? ?Cancelled Treatment:    Reason Eval/Treat Not Completed: Other (comment).  Chart reviewed and attempted to see pt.  Upon arrival to room, surgical boot still not present in room.  Nursing advised and would be ordering it for future session.  Will re-attempt as medically necessary at later date/time. ? ? ?Gwenlyn Saran, PT, DPT ?07/10/21, 1:16 PM ? ?

## 2021-07-10 NOTE — Progress Notes (Signed)
1 Day Post-Op  ? ?Subjective/Chief Complaint: ?Patient seen.  No specific complaints. ? ? ?Objective: ?Vital signs in last 24 hours: ?Temp:  [97.4 ?F (36.3 ?C)-98.7 ?F (37.1 ?C)] 98.1 ?F (36.7 ?C) (03/31 0370) ?Pulse Rate:  [76-96] 96 (03/31 0833) ?Resp:  [14-25] 18 (03/31 4888) ?BP: (105-174)/(55-83) 174/83 (03/31 9169) ?SpO2:  [87 %-99 %] 97 % (03/31 0833) ?Weight:  [68 kg] 68 kg (03/30 1714) ?Last BM Date : 07/09/21 ? ?Intake/Output from previous day: ?03/30 0701 - 03/31 0700 ?In: 1150 [I.V.:700; IV Piggyback:450] ?Out: 603 [Urine:600; Blood:3] ?Intake/Output this shift: ?Total I/O ?In: 200 [P.O.:200] ?Out: -  ? ?Minimal bleeding noted on the bandaging.  Upon removal the incision is well coapted.  No signs of any purulence.  Mild erythema and edema. ? ?Lab Results:  ?Recent Labs  ?  07/09/21 ?0722 07/10/21 ?0818  ?WBC 10.2 13.0*  ?HGB 11.4* 11.7*  ?HCT 37.3 38.7  ?PLT 163 185  ? ?BMET ?Recent Labs  ?  07/09/21 ?0722 07/09/21 ?1451 07/10/21 ?0818  ?NA 137  --  138  ?K 3.3*  --  3.3*  ?CL 104  --  106  ?CO2 22  --  22  ?GLUCOSE 297*  --  106*  ?BUN '13 12 10  '$ ?CREATININE 0.91 0.73 0.77  ?CALCIUM 7.6*  --  7.8*  ? ?PT/INR ?No results for input(s): LABPROT, INR in the last 72 hours. ?ABG ?No results for input(s): PHART, HCO3 in the last 72 hours. ? ?Invalid input(s): PCO2, PO2 ? ?Studies/Results: ?PERIPHERAL VASCULAR CATHETERIZATION ? ?Result Date: 07/09/2021 ?See surgical note for result.  ? ?Anti-infectives: ?Anti-infectives (From admission, onward)  ? ? Start     Dose/Rate Route Frequency Ordered Stop  ? 07/10/21 1400  amoxicillin (AMOXIL) capsule 500 mg       ? 500 mg Oral Every 8 hours 07/10/21 1204 07/16/21 0559  ? 07/09/21 1530  clindamycin (CLEOCIN) IVPB 300 mg       ? 300 mg ?100 mL/hr over 30 Minutes Intravenous  Once 07/08/21 1357 07/09/21 1628  ? 07/09/21 1515  clindamycin (CLEOCIN) IVPB 300 mg  Status:  Discontinued       ? 300 mg ?100 mL/hr over 30 Minutes Intravenous  Once 07/09/21 1421 07/09/21 1425  ?  07/08/21 2200  vancomycin (VANCOREADY) IVPB 1250 mg/250 mL  Status:  Discontinued       ? 1,250 mg ?166.7 mL/hr over 90 Minutes Intravenous Every 48 hours 07/07/21 0059 07/07/21 0926  ? 07/07/21 2200  vancomycin (VANCOREADY) IVPB 750 mg/150 mL  Status:  Discontinued       ? 750 mg ?150 mL/hr over 60 Minutes Intravenous Every 24 hours 07/07/21 0926 07/10/21 1204  ? 07/07/21 2200  cefTRIAXone (ROCEPHIN) 2 g in sodium chloride 0.9 % 100 mL IVPB  Status:  Discontinued       ? 2 g ?200 mL/hr over 30 Minutes Intravenous Every 24 hours 07/07/21 1537 07/10/21 1204  ? 07/07/21 2200  metroNIDAZOLE (FLAGYL) IVPB 500 mg  Status:  Discontinued       ? 500 mg ?100 mL/hr over 60 Minutes Intravenous Every 12 hours 07/07/21 1537 07/10/21 1204  ? 07/07/21 0500  piperacillin-tazobactam (ZOSYN) IVPB 3.375 g  Status:  Discontinued       ? 3.375 g ?12.5 mL/hr over 240 Minutes Intravenous Every 8 hours 07/07/21 0126 07/07/21 1536  ? 07/06/21 2300  vancomycin (VANCOREADY) IVPB 1500 mg/300 mL       ? 1,500 mg ?150 mL/hr over 120  Minutes Intravenous  Once 07/06/21 2247 07/07/21 0059  ? 07/06/21 2300  piperacillin-tazobactam (ZOSYN) IVPB 3.375 g       ? 3.375 g ?100 mL/hr over 30 Minutes Intravenous  Once 07/06/21 2247 07/06/21 2325  ? ?  ? ? ?Assessment/Plan: ?s/p Procedure(s): ?AMPUTATION LEFT 2nd TOE (Left) ?Assessment: Stable status post amputation left second toe. ? ?Plan: Betadine gauze and a sterile bandage reapplied to the left foot.  Patient instructed to keep this clean, dry, and do not remove.  Instructed on walking in her surgical shoe with pressure only on the heel.  From podiatry standpoint patient is stable for discharge or transfer to skilled nursing.  If still in over the weekend we will plan for dressing change prior to discharge, otherwise follow-up next week for reevaluation. ? LOS: 4 days  ? ? ?Durward Fortes ?07/10/2021 ? ?

## 2021-07-10 NOTE — Care Management Important Message (Signed)
Important Message ? ?Patient Details  ?Name: Andrea Lawson ?MRN: [id number] ?Date of Birth: Jun 07, 1931 ? ? ?Medicare Important Message Given:  Yes ? ? ? ? ?[name] ?07/10/2021, 2:45 PM ?

## 2021-07-10 NOTE — TOC Initial Note (Signed)
Transition of Care (TOC) - Initial/Assessment Note  ? ? ?Patient Details  ?Name: Andrea Lawson ?MRN: [id number] ?Date of Birth: [birth year] ? ?Transition of Care (TOC) CM/SW Contact:    ?Candie Chroman, LCSW ?Phone Number: ?07/10/2021, 3:59 PM ? ?Clinical Narrative:  CSW met with patient. No supports at bedside. CSW introduced role and explained that PT recommendations would be discussed. Patient initially not interested in rehab at [facility]. Called daughter and she said they have discussed it. Went back in the room and told her plan is for [facility] and patient understood. Daughter will likely transport her by car. Plan for discharge tomorrow. SNF admissions coordinator is aware. No further concerns. CSW encouraged patient and her daughter to contact CSW as needed. CSW will continue to follow patient and her daughter for support and facilitate discharge to SNF tomorrow.             ? ?Expected Discharge Plan: [place] ?Barriers to Discharge: Continued Medical Work up ? ? ?Patient Goals and CMS Choice ?  ?  ?Choice offered to / list presented to : Patient, Adult Children ? ?Expected Discharge Plan and Services ?Expected Discharge Plan: [place] ?  ?  ?Post Acute Care Choice: [place] ?Living arrangements for the past 2 months: [place] ?                ?  ?  ?  ?  ?  ?  ?  ?  ?  ?  ? ?Prior Living Arrangements/Services ?Living arrangements for the past 2 months: [place] ?Lives with:: Self ?Patient language and need for interpreter reviewed:: Yes ?Do you feel safe going back to the place where you live?: Yes      ?Need for Family Participation in Patient Care: Yes (Comment) ?Care giver support system in place?: Yes (comment) ?Current home services: DME ?Criminal Activity/Legal Involvement Pertinent to Current Situation/Hospitalization: No - Comment as needed ? ?Activities of Daily Living ?Home Assistive Devices/Equipment:  Tajae, Maiolo (specify type) ?ADL Screening (condition at time of admission) ?Patient's cognitive ability adequate to safely complete daily activities?: Yes ?Is the patient deaf or have difficulty hearing?: No ?Does the patient have difficulty seeing, even when wearing glasses/contacts?: No ?Does the patient have difficulty concentrating, remembering, or making decisions?: No ?Patient able to express need for assistance with ADLs?: Yes ?Does the patient have difficulty dressing or bathing?: No ?Independently performs ADLs?: Yes (appropriate for developmental age) ?Does the patient have difficulty walking or climbing stairs?: No ?Weakness of Legs: None ?Weakness of Arms/Hands: None ? ?Permission Sought/Granted ?Permission sought to share information with : Facility Sport and exercise psychologist, Family Supports ?Permission granted to share information with : Yes, Verbal Permission Granted ? Share Information with NAME: Lisabeth Pick ? Permission granted to share info w AGENCY: [facility] ? Permission granted to share info w Relationship: Daughter ? Permission granted to share info w Contact Information: [phone] ? ?Emotional Assessment ?Appearance:: Appears stated age ?Attitude/Demeanor/Rapport: Engaged ?Affect (typically observed): Calm ?Orientation: : Oriented to Self, Oriented to Place, Oriented to  Time, Oriented to Situation ?Alcohol / Substance Use: Not Applicable ?Psych Involvement: No (comment) ? ?Admission diagnosis:  Other osteomyelitis of left foot ([place]) [M86.8X7] ?Osteomyelitis of second toe of left foot ([place]) [M86.9] ?Patient Active Problem List  ? Diagnosis Date Noted  ? Osteomyelitis of second toe of left foot ([place]) 07/06/2021  ? Lactic acidosis 07/06/2021  ? Aortic atherosclerosis ([place]) 05/29/2021  ? Chronic anemia  05/29/2021  ? Thrombocytopenia ([place]) 05/29/2021  ? HTN (hypertension) 12/05/2020  ? Abdominal hernia 12/05/2020  ? Type 2 diabetes mellitus with other circulatory complications ([place])  37/95/5831  ? Chronic diastolic heart failure ([place])   ? GERD (gastroesophageal reflux disease)   ? Chronic kidney disease, stage 3a ([place])   ? Wet senile macular degeneration ([place])   ? AAA (abdominal aortic aneurysm)   ? ?PCP:  Venia Carbon, MD ?Pharmacy:   ?[facility] - [place], [place] - [place] ?[address] ?[place] [address] ?Phone: [phone] Fax: [phone] ? ? ? ? ?Social Determinants of Health (SDOH) Interventions ?  ? ?Readmission Risk Interventions ?   ? View : No data to display.  ?  ?  ?  ? ? ? ?

## 2021-07-10 NOTE — Progress Notes (Signed)
?Progress Note ? ? ? ?Andrea Lawson   ?VXY:[id number]  ?DOB: Sep 19, 1931  ?DOA: 07/06/2021     4 ?PCP: Venia Carbon, MD ? ?Initial CC: toe infection ? ?Hospital Course: ?Ms. Andrea Lawson is an 88 yo female with PMH chronic dCHF, CKD3a, DMII, GERD, HLD, hyperparathyroidism, HTN, hypothyroidism, diabetic neuropathy, macular degeneration who presented to the hospital after referral from her podiatrist office due to concern for osteomyelitis involving her left foot second digit.  She was started on vancomycin and Zosyn.  She was evaluated by podiatry and underwent left second toe amputation on 07/09/2021. ? ?Interval History:  ?No events overnight.  Tolerated surgery well yesterday and has been ambulating today with physical therapy. ? ?Assessment and Plan: ?* Osteomyelitis of second toe of left foot ([place]) ?- Continued on vancomycin; d/c'd zosyn (given CKD3, would like to avoid increased risk of renal toxicity with vanc/zosyn combo); changed zosyn to rocephin and flagyl ?- informed by Dr. Silvio Pate on 3/30 that outpt culture noted with E. Faecalis (sens to amp and vanc).  ?-Discussed with pharmacy, patient changed to amoxicillin to complete course ?-Left lower extremity angiogram with vascular surgery on 07/09/2021 was also unremarkable with no intervention required ?-Underwent amputation with podiatry on 07/09/2021 ? ?Lactic acidosis ?- s/p IVF with good response ? ?Type 2 diabetes mellitus with other circulatory complications (HCC) ?- M2L 10% ?- resume basal insulin; will adjust as needed ?-Metformin on hold during hospitalization, but will max dose at discharge after clarifying if she is tolerating ?- continue SSI and CBG monitoring  ?- greatly appreciate diabetes coordinator note; agree that ALF needs to check CBG at least daily and also administer insulin or at least observe/monitor/educate patient on proper administration  ? ?Chronic anemia ?- Baseline approximately 10 to 11 g/dL ?- Currently at baseline ? ?HTN  (hypertension) ?Controlled.  Continue carvedilol ? ?Chronic kidney disease, stage 3a ([place]) ?- patient has history of CKD3a. Baseline creat ~ 1.2, eGFR 50 ? ? ?Chronic diastolic heart failure (HCC) ?- no s/s exacerbation  ?- Continue carvedilol ? ? ?Old records reviewed in assessment of this patient ? ?Antimicrobials: ?Vancomycin 07/06/2021 >> 3/31 ?Zosyn 07/06/2021 >>3/28 ?Rocephin 3/28 >> 3/31 ?Flagyl 3/28 >> 3/31 ?Amoxicillin 3/31 >> current  ? ?DVT prophylaxis:  ?enoxaparin (LOVENOX) injection 40 mg Start: 07/07/21 2200 ?SCDs Start: 07/06/21 2347 ? ? ?Code Status:   Code Status: DNR ? ?Disposition Plan:  [place] ~Saturday  ?Status is: Inpt ? ?Objective: ?Blood pressure (!) 174/83, pulse 96, temperature 98.1 ?F (36.7 ?C), resp. rate 18, height 5' (1.524 m), weight 68 kg, SpO2 97 %.  ?Examination:  ?Physical Exam ?Constitutional:   ?   General: She is not in acute distress. ?   Appearance: Normal appearance.  ?HENT:  ?   Head: Normocephalic and atraumatic.  ?   Mouth/Throat:  ?   Mouth: Mucous membranes are moist.  ?Eyes:  ?   Extraocular Movements: Extraocular movements intact.  ?Cardiovascular:  ?   Rate and Rhythm: Normal rate and regular rhythm.  ?Pulmonary:  ?   Effort: Pulmonary effort is normal.  ?   Breath sounds: Normal breath sounds.  ?Abdominal:  ?   General: Bowel sounds are normal. There is no distension.  ?   Palpations: Abdomen is soft.  ?   Tenderness: There is no abdominal tenderness.  ?Musculoskeletal:  ?   Cervical back: Normal range of motion and neck supple.  ?   Comments: Left foot wrapped in surgical dressing; no soaking thru  ?  Neurological:  ?   Mental Status: She is alert.  ?  ? ?Consultants:  ?Podiatry ?Vascular Surgery ? ?Procedures:  ? Amputation left second toe metatarsophalangeal joint, 07/09/21 ? ?Data Reviewed: ?Results for orders placed or performed during the hospital encounter of 07/06/21 (from the past 24 hour(s))  ?Glucose, capillary     Status: None  ? Collection Time: 07/09/21   3:28 PM  ?Result Value Ref Range  ? Glucose-Capillary 99 70 - 99 mg/dL  ?Glucose, capillary     Status: None  ? Collection Time: 07/09/21  5:26 PM  ?Result Value Ref Range  ? Glucose-Capillary 89 70 - 99 mg/dL  ?Aerobic/Anaerobic Culture w Gram Stain (surgical/deep wound)     Status: None (Preliminary result)  ? Collection Time: 07/09/21  6:58 PM  ? Specimen: PATH Other; Wound  ?Result Value Ref Range  ? Specimen Description    ?  BONE ?Performed at [facility], [place] [address] [place], [place] [address] ?  ? Special Requests    ?  LEFT FOOT 2ND TOE ?Performed at [facility], [address] [place], [place] [address] ?  ? Gram Stain    ?  RARE WBC PRESENT, PREDOMINANTLY MONONUCLEAR ?NO ORGANISMS SEEN ?  ? Culture    ?  NO GROWTH < 12 HOURS ?Performed at [facility], [place] [address] [place], [place] [address] ?  ? Report Status PENDING   ?Glucose, capillary     Status: Abnormal  ? Collection Time: 07/09/21  7:18 PM  ?Result Value Ref Range  ? Glucose-Capillary 111 (H) 70 - 99 mg/dL  ? Comment 1 Notify RN   ? Comment 2 Document in Chart   ?Glucose, capillary     Status: Abnormal  ? Collection Time: 07/09/21  9:33 PM  ?Result Value Ref Range  ? Glucose-Capillary 109 (H) 70 - 99 mg/dL  ?Basic metabolic panel     Status: Abnormal  ? Collection Time: 07/10/21  8:18 AM  ?Result Value Ref Range  ? Sodium 138 135 - 145 mmol/L  ? Potassium 3.3 (L) 3.5 - 5.1 mmol/L  ? Chloride 106 98 - 111 mmol/L  ? CO2 22 22 - 32 mmol/L  ? Glucose, Bld 106 (H) 70 - 99 mg/dL  ? BUN 10 8 - 23 mg/dL  ? Creatinine, Ser 0.77 0.44 - 1.00 mg/dL  ? Calcium 7.8 (L) 8.9 - 10.3 mg/dL  ? GFR, Estimated >60 >60 mL/min  ? Anion gap 10 5 - 15  ?CBC with Differential/Platelet     Status: Abnormal  ? Collection Time: 07/10/21  8:18 AM  ?Result Value Ref Range  ? WBC 13.0 (H) 4.0 - 10.5 K/uL  ? RBC 4.09 3.87 - 5.11 MIL/uL  ? Hemoglobin 11.7 (L) 12.0 - 15.0 g/dL  ? HCT 38.7 36.0 - 46.0 %  ? MCV 94.6 80.0 - 100.0 fL  ? MCH 28.6 26.0 -  34.0 pg  ? MCHC 30.2 30.0 - 36.0 g/dL  ? RDW 13.1 11.5 - 15.5 %  ? Platelets 185 150 - 400 K/uL  ? nRBC 0.0 0.0 - 0.2 %  ? Neutrophils Relative % 81 %  ? Neutro Abs 10.6 (H) 1.7 - 7.7 K/uL  ? Lymphocytes Relative 9 %  ? Lymphs Abs 1.1 0.7 - 4.0 K/uL  ? Monocytes Relative 7 %  ? Monocytes Absolute 0.9 0.1 - 1.0 K/uL  ? Eosinophils Relative 2 %  ? Eosinophils Absolute 0.3 0.0 - 0.5 K/uL  ?  Basophils Relative 0 %  ? Basophils Absolute 0.0 0.0 - 0.1 K/uL  ? Immature Granulocytes 1 %  ? Abs Immature Granulocytes 0.12 (H) 0.00 - 0.07 K/uL  ?Magnesium     Status: None  ? Collection Time: 07/10/21  8:18 AM  ?Result Value Ref Range  ? Magnesium 1.9 1.7 - 2.4 mg/dL  ?Glucose, capillary     Status: Abnormal  ? Collection Time: 07/10/21  9:25 AM  ?Result Value Ref Range  ? Glucose-Capillary 122 (H) 70 - 99 mg/dL  ?Glucose, capillary     Status: Abnormal  ? Collection Time: 07/10/21 12:34 PM  ?Result Value Ref Range  ? Glucose-Capillary 191 (H) 70 - 99 mg/dL  ?  ?I have Reviewed nursing notes, Vitals, and Lab results since pt's last encounter. Pertinent lab results : see above ?I have ordered test including BMP, CBC, Mg ?I have reviewed the last note from staff over past 24 hours ?I have discussed pt's care plan and test results with nursing staff, case manager ? ? LOS: 4 days  ? ?Dwyane Dee, MD ?[facility] ?07/10/2021, 3:12 PM ? ?

## 2021-07-10 NOTE — Progress Notes (Signed)
Physical Therapy Treatment ?Patient Details ?Name: Andrea Lawson ?MRN: [id number] ?DOB: 08-16-1931 ?Today's Date: 07/10/2021 ? ? ?History of Present Illness 88 yo female with onset of non healing wound on L second toe on plantar surface was admitted on 3/27 for evaluation and vascular treatment.  Pt has osteomyeltiis in toe, pending decision on amputation.  PMHx:  AAA, anemia, B12 defic, cataract, CKD, DM, PN, gout, hypothyroid, HTN, incontinent, lumbar pain, macular degeneration ? ?  ?PT Comments  ? ? Pt received seated in recliner upon arrival to room and pt agreeable to therapy.  Therapist assisted with the donning of the surgical boot to keep weight off surgical site.  Pt then performed sit to stand transfer with good technique, however does not like to stay within the [place] once upright.  Pt given verbal cuing consistently throughout session, however pt does not have good carryover.  Pt able to ambulate tot he nursing station and back to room with CGA.  Pt's daughter arriving at conclusion of therapy session.  Pt left in recliner with LE's elevated at the reluctance of the pt, as pt does not like to have her LE's elevated.  Current discharge plans to SNF remain appropriate at this time, due to decreased strength and mobility with AD.  Pt will continue to benefit from skilled therapy in order to address deficits listed below. ?   ?Recommendations for follow up therapy are one component of a multi-disciplinary discharge planning process, led by the attending physician.  Recommendations may be updated based on patient status, additional functional criteria and insurance authorization. ? ?Follow Up Recommendations ? Skilled nursing-short term rehab (<3 hours/day) ?  ?  ?Assistance Recommended at Discharge Frequent or constant Supervision/Assistance  ?Patient can return home with the following A little help with walking and/or transfers;A little help with bathing/dressing/bathroom;Assistance with  cooking/housework;Assist for transportation;Help with stairs or ramp for entrance ?  ?Equipment Recommendations ? None recommended by PT  ?  ?Recommendations for Other Services   ? ? ?  ?Precautions / Restrictions Precautions ?Precautions: Fall ?Precaution Comments: L 2nd toe amputated ?Required Braces or Orthoses:  (no orthotic shoe ordered) ?Restrictions ?Weight Bearing Restrictions: No  ?  ? ?Mobility ? Bed Mobility ?Overal bed mobility: Needs Assistance ?Bed Mobility: Supine to Sit, Sit to Supine ?  ?  ?Supine to sit: Min assist ?Sit to supine: Min assist ?  ?  ?  ? ?Transfers ?Overall transfer level: Needs assistance ?Equipment used: Rolling [place] (2 wheels) ?Transfers: Sit to/from Stand ?Sit to Stand: Min assist ?  ?  ?  ?  ?  ?General transfer comment: assist to stand with cues for foot and hand placement ?  ? ?Ambulation/Gait ?Ambulation/Gait assistance: Min assist ?Gait Distance (Feet): 40 Feet ?Assistive device: Rolling [place] (2 wheels) ?Gait Pattern/deviations: Step-to pattern, Decreased stride length ?Gait velocity: decreased ?  ?  ?  ? ? ?Stairs ?  ?  ?  ?  ?  ? ? ?Wheelchair Mobility ?  ? ?Modified Rankin (Stroke Patients Only) ?  ? ? ?  ?Balance Overall balance assessment: Needs assistance ?Sitting-balance support: Feet supported, Single extremity supported ?Sitting balance-Leahy Scale: Fair ?Sitting balance - Comments: supporting herself with no drift ?  ?Standing balance support: Bilateral upper extremity supported, During functional activity ?Standing balance-Leahy Scale: Poor ?Standing balance comment: requires guard for standing mobility ?  ?  ?  ?  ?  ?  ?  ?  ?  ?  ?  ?  ? ?  ?  Cognition Arousal/Alertness: Awake/alert ?Behavior During Therapy: Flat affect ?Overall Cognitive Status: No family/caregiver present to determine baseline cognitive functioning ?  ?  ?  ?  ?  ?  ?  ?  ?  ?  ?  ?  ?  ?  ?  ?  ?General Comments: giving history to PT but no family to corroborate ?  ?  ? ?  ?Exercises  Other Exercises ?Other Exercises: Consistent education on elevation of the LE's, although pt refused initially, pt eventually performed at conclusion of session. ? ?  ?General Comments General comments (skin integrity, edema, etc.): pt able to ambulate, but has difficulty with navigation of the [place] at times. ?  ?  ? ?Pertinent Vitals/Pain Pain Assessment ?Pain Assessment: No/denies pain  ? ? ?Home Living   ?  ?  ?  ?  ?  ?  ?  ?  ?  ?   ?  ?Prior Function    ?  ?  ?   ? ?PT Goals (current goals can now be found in the care plan section) Acute Rehab PT Goals ?Patient Stated Goal: to get home and recover strength ?PT Goal Formulation: With patient ?Time For Goal Achievement: 07/22/21 ?Potential to Achieve Goals: Good ?Progress towards PT goals: Progressing toward goals ? ?  ?Frequency ? ? ? Min 2X/week ? ? ? ?  ?PT Plan Current plan remains appropriate  ? ? ?Co-evaluation   ?  ?  ?  ?  ? ?  ?AM-PAC PT "6 Clicks" Mobility   ?Outcome Measure ? Help needed turning from your back to your side while in a flat bed without using bedrails?: A Little ?Help needed moving from lying on your back to sitting on the side of a flat bed without using bedrails?: A Little ?Help needed moving to and from a bed to a chair (including a wheelchair)?: A Little ?Help needed standing up from a chair using your arms (e.g., wheelchair or bedside chair)?: A Little ?Help needed to walk in hospital room?: A Lot ?Help needed climbing 3-5 steps with a railing? : A Lot ?6 Click Score: 16 ? ?  ?End of Session Equipment Utilized During Treatment: Gait belt ?Activity Tolerance: Patient limited by fatigue ?Patient left: with call bell/phone within reach;in chair;with chair alarm set;with family/visitor present ?Nurse Communication: Mobility status ?PT Visit Diagnosis: Unsteadiness on feet (R26.81);Muscle weakness (generalized) (M62.81);Difficulty in walking, not elsewhere classified (R26.2) ?  ? ? ?Time: 5003-7048 ?PT Time Calculation (min) (ACUTE  ONLY): 32 min ? ?Charges:  $Gait Training: 23-37 mins          ?          ? ?Gwenlyn Saran, PT, DPT ?07/10/21, 4:36 PM ? ? ? ?Christie Nottingham ?07/10/2021, 4:34 PM ? ?

## 2021-07-10 NOTE — Progress Notes (Signed)
Pt ambulating in hallway with PT.  

## 2021-07-11 LAB — CBC WITH DIFFERENTIAL/PLATELET
Abs Immature Granulocytes: 0.07 10*3/uL (ref 0.00–0.07)
Basophils Absolute: 0 10*3/uL (ref 0.0–0.1)
Basophils Relative: 0 %
Eosinophils Absolute: 0.2 10*3/uL (ref 0.0–0.5)
Eosinophils Relative: 2 %
HCT: 31.2 % — ABNORMAL LOW (ref 36.0–46.0)
Hemoglobin: 9.5 g/dL — ABNORMAL LOW (ref 12.0–15.0)
Immature Granulocytes: 1 %
Lymphocytes Relative: 13 %
Lymphs Abs: 1.3 10*3/uL (ref 0.7–4.0)
MCH: 28.3 pg (ref 26.0–34.0)
MCHC: 30.4 g/dL (ref 30.0–36.0)
MCV: 92.9 fL (ref 80.0–100.0)
Monocytes Absolute: 0.6 10*3/uL (ref 0.1–1.0)
Monocytes Relative: 6 %
Neutro Abs: 7.6 10*3/uL (ref 1.7–7.7)
Neutrophils Relative %: 78 %
Platelets: 163 10*3/uL (ref 150–400)
RBC: 3.36 MIL/uL — ABNORMAL LOW (ref 3.87–5.11)
RDW: 13.1 % (ref 11.5–15.5)
WBC: 9.8 10*3/uL (ref 4.0–10.5)
nRBC: 0 % (ref 0.0–0.2)

## 2021-07-11 LAB — BASIC METABOLIC PANEL
Anion gap: 7 (ref 5–15)
BUN: 12 mg/dL (ref 8–23)
CO2: 21 mmol/L — ABNORMAL LOW (ref 22–32)
Calcium: 7.7 mg/dL — ABNORMAL LOW (ref 8.9–10.3)
Chloride: 109 mmol/L (ref 98–111)
Creatinine, Ser: 0.7 mg/dL (ref 0.44–1.00)
GFR, Estimated: >60 mL/min (ref >60)
Glucose, Bld: 176 mg/dL — ABNORMAL HIGH (ref 70–99)
Potassium: 3.7 mmol/L (ref 3.5–5.1)
Sodium: 137 mmol/L (ref 135–145)

## 2021-07-11 LAB — CULTURE, BLOOD (ROUTINE X 2)
Culture: NO GROWTH
Culture: NO GROWTH
Special Requests: ADEQUATE
Special Requests: ADEQUATE

## 2021-07-11 LAB — GLUCOSE, CAPILLARY
Glucose-Capillary: 178 mg/dL — ABNORMAL HIGH (ref 70–99)
Glucose-Capillary: 207 mg/dL — ABNORMAL HIGH (ref 70–99)

## 2021-07-11 LAB — MAGNESIUM: Magnesium: 1.8 mg/dL (ref 1.7–2.4)

## 2021-07-11 MED ORDER — OXYCODONE HCL 5 MG PO TABS: 5.0000 mg | ORAL_TABLET | Freq: Three times a day (TID) | ORAL | 0 refills | Status: DC | PRN

## 2021-07-11 MED ORDER — ACETAMINOPHEN 325 MG PO TABS: 650.0000 mg | ORAL_TABLET | ORAL | Status: DC | PRN

## 2021-07-11 MED ORDER — AMOXICILLIN 500 MG PO CAPS
500.0000 mg | ORAL_CAPSULE | Freq: Three times a day (TID) | ORAL | 0 refills | Status: AC
Start: 2021-07-11 — End: 2021-07-16

## 2021-07-11 NOTE — Discharge Summary (Signed)
?Physician Discharge Summary ?  ?Patient: Andrea Lawson MRN: [id number] DOB: 11/05/1931  ?Admit date:     07/06/2021  ?Discharge date: 07/11/21  ?Discharge Physician:    ? ?PCP: Letvak, Richard I, MD  ? ?Recommendations at discharge:  ? ? Follow up with podiatry; keep dressing clean, dry, intact until follow up ? ?Discharge Diagnoses: ?Principal Problem: ?  Osteomyelitis of second toe of left foot (HCC) ?Active Problems: ?  Type 2 diabetes mellitus with other circulatory complications (HCC) ?  Chronic diastolic heart failure (HCC) ?  Chronic kidney disease, stage 3a (HCC) ?  HTN (hypertension) ?  Chronic anemia ? ?Resolved Problems: ?  Lactic acidosis ? ?Hospital Course: ?[name] is an 89 yo female with PMH chronic dCHF, CKD3a, DMII, GERD, HLD, hyperparathyroidism, HTN, hypothyroidism, diabetic neuropathy, macular degeneration who presented to the hospital after referral from her podiatrist office due to concern for osteomyelitis involving her left foot second digit.  She was started on vancomycin and Zosyn.  She was evaluated by podiatry and underwent left second toe amputation on 07/09/2021. ?She was evaluated by physical therapy with recommendations for SNF.  She accepted a bed at [facility]. ? ?Assessment and Plan: ?* Osteomyelitis of second toe of left foot (HCC) ?- Continued on vancomycin; d/c'd zosyn (given CKD3, would like to avoid increased risk of renal toxicity with vanc/zosyn combo); changed zosyn to rocephin and flagyl ?- informed by Dr. Letvak on 3/30 that outpt culture noted with E. Faecalis (sens to amp and vanc).  ?-Discussed with pharmacy, patient changed to amoxicillin to complete course ?-Left lower extremity angiogram with vascular surgery on 07/09/2021 was also unremarkable with no intervention required ?-Underwent amputation with podiatry on 07/09/2021 ? ?Type 2 diabetes mellitus with other circulatory complications (HCC) ?- A1c 10% ?- resume basal insulin; will adjust as  needed ?-Metformin on hold during hospitalization, but resumed at discharge; low CrCl but GFR seems appropriate (>60) ?- continue SSI and CBG monitoring  ?- greatly appreciate diabetes coordinator note; agree that ALF needs to check CBG at least daily and also administer insulin or at least observe/monitor/educate patient on proper administration  ?-Discharging to SNF for now ? ?Lactic acidosis-resolved as of 07/11/2021 ?- s/p IVF with good response ? ?Chronic anemia ?- Baseline approximately 10 to 11 g/dL ?- Currently at baseline ? ?HTN (hypertension) ?Controlled.  Continue carvedilol ? ?Chronic kidney disease, stage 3a (HCC) ?- patient has history of CKD3a. Baseline creat ~ 1.2, eGFR 50 ? ? ?Chronic diastolic heart failure (HCC) ?- no s/s exacerbation  ?- Continue carvedilol ? ? ? ? ?  ? ?Consultants: Podiatry  ?Procedures performed: Amputation left second toe metatarsophalangeal joint, 07/09/21  ?Disposition: Skilled nursing facility ?Diet recommendation:  ?Discharge Diet Orders (From admission, onward)  ? ?  Start     Ordered  ? 07/11/21 0000  Diet Carb Modified       ? 07/11/21 1121  ? ?  ?  ? ?  ? ?Carb modified diet ?DISCHARGE MEDICATION: ?Allergies as of 07/11/2021   ? ?   Reactions  ? Simvastatin   ? Other reaction(s): Other (See Comments) ?Other Reaction: Myositis  ? Ace Inhibitors   ? Other reaction(s): Cough  ? Angiotensin Receptor Blockers Cough  ? Other reaction(s): Cough  ? Lactose Other (See Comments)  ? intolerant  ? Losartan   ? Other reaction(s): Unknown  ? Penicillins Rash  ? Other reaction(s): Other (See Comments) ?Other Reaction: ALLERGY ?3/31 - patient does not recall being allergic   to PCN or amoxicillin  ? ?  ? ?  ?Medication List  ?  ? ?STOP taking these medications   ? ?OneTouch Ultra test strip ?Generic drug: glucose blood ?  ? ?  ? ?TAKE these medications   ? ?acetaminophen 325 MG tablet ?Commonly known as: TYLENOL ?Take 2 tablets (650 mg total) by mouth every 4 (four) hours as needed for mild  pain (or Fever >/= 101). ?  ?amoxicillin 500 MG capsule ?Commonly known as: AMOXIL ?Take 1 capsule (500 mg total) by mouth every 8 (eight) hours for 5 days. ?  ?carvedilol 6.25 MG tablet ?Commonly known as: COREG ?TAKE ONE TABLET BY MOUTH TWICE DAILY ?  ?cholecalciferol 10 MCG (400 UNIT) Tabs tablet ?Commonly known as: VITAMIN D3 ?Take 1,000 Units by mouth. ?  ?furosemide 20 MG tablet ?Commonly known as: LASIX ?TAKE ONE TABLET BY MOUTH EVERY DAY ?  ?insulin degludec 100 UNIT/ML FlexTouch Pen ?Commonly known as: TRESIBA ?Inject 5 Units into the skin daily at 10 pm. ?  ?Insulin Pen Needle 32G X 4 MM Misc ?1 Dose by Does not apply route daily. ?  ?latanoprost 0.005 % ophthalmic solution ?Commonly known as: XALATAN ?Place 1 drop into both eyes every evening. ?  ?metFORMIN 500 MG tablet ?Commonly known as: GLUCOPHAGE ?TAKE ONE TABLET BY MOUTH TWICE DAILY ?  ?Nyamyc powder ?Generic drug: nystatin ?Apply topically. ?  ?oxyCODONE 5 MG immediate release tablet ?Commonly known as: Roxicodone ?Take 1 tablet (5 mg total) by mouth every 8 (eight) hours as needed. ?  ?potassium chloride 10 MEQ tablet ?Commonly known as: KLOR-CON M ?TAKE ONE TABLET BY MOUTH TWICE DAILY ?  ?VITAMIN B 12 PO ?Take 1,000 mcg by mouth daily. ?  ? ?  ? ?  ?  ? ? ?  ?Discharge Care Instructions  ?(From admission, onward)  ?  ? ? ?  ? ?  Start     Ordered  ? 07/11/21 0000  Discharge wound care:       ?Comments: Keep dressing clean, dry, intact until seen by podiatry outpatient  ? 07/11/21 1121  ? ?  ?  ? ?  ? ? ?Discharge Exam: ?Filed Weights  ? 07/06/21 1920 07/07/21 0047 07/09/21 1714  ?Weight: 68.5 kg 68 kg 68 kg  ? ?Physical Exam ?Constitutional:   ?   General: She is not in acute distress. ?   Appearance: Normal appearance.  ?HENT:  ?   Head: Normocephalic and atraumatic.  ?   Mouth/Throat:  ?   Mouth: Mucous membranes are moist.  ?Eyes:  ?   Extraocular Movements: Extraocular movements intact.  ?Cardiovascular:  ?   Rate and Rhythm: Normal rate and  regular rhythm.  ?Pulmonary:  ?   Effort: Pulmonary effort is normal.  ?   Breath sounds: Normal breath sounds.  ?Abdominal:  ?   General: Bowel sounds are normal. There is no distension.  ?   Palpations: Abdomen is soft.  ?   Tenderness: There is no abdominal tenderness.  ?Musculoskeletal:  ?   Cervical back: Normal range of motion and neck supple.  ?   Comments: Left foot wrapped in surgical dressing; no soaking thru  ?Neurological:  ?   Mental Status: She is alert.  ? ? ? ?Condition at discharge: stable ? ?The results of significant diagnostics from this hospitalization (including imaging, microbiology, ancillary and laboratory) are listed below for reference.  ? ?Imaging Studies: ?PERIPHERAL VASCULAR CATHETERIZATION ? ?Result Date: 07/09/2021 ?See surgical note for  result. ? ?DG Foot Complete Left ? ?Result Date: 07/06/2021 ?CLINICAL DATA:  Evaluation for osteomyelitis of the second and third left toes. EXAM: LEFT FOOT - COMPLETE 3+ VIEW COMPARISON:  None. FINDINGS: There is no evidence of an fracture or dislocation. Cortical destruction is seen along the distal phalanx of the second left toe. Marked severity diffuse soft tissue swelling of the second left toe is also noted. IMPRESSION: Findings suspicious for acute osteomyelitis involving the distal phalanx of the second left toe. Electronically Signed   By: Thaddeus  Houston M.D.   On: 07/06/2021 23:14   ? ?Microbiology: ?Results for orders placed or performed during the hospital encounter of 07/06/21  ?Blood culture (routine x 2)     Status: None  ? Collection Time: 07/06/21  7:30 PM  ? Specimen: BLOOD  ?Result Value Ref Range Status  ? Specimen Description BLOOD LEFT ANTECUBITAL  Final  ? Special Requests   Final  ?  BOTTLES DRAWN AEROBIC AND ANAEROBIC Blood Culture adequate volume  ? Culture   Final  ?  NO GROWTH 5 DAYS ?Performed at [facility], [address] , [place] [address] ?  ? Report Status 07/11/2021 FINAL  Final  ?Blood culture  (routine x 2)     Status: None  ? Collection Time: 07/06/21  7:47 PM  ? Specimen: BLOOD  ?Result Value Ref Range Status  ? Specimen Description BLOOD BLOOD RIGHT HAND  Final  ? Special Requests   Final  ?  BOTTLE

## 2021-07-11 NOTE — Progress Notes (Signed)
Pt discharged to [facility] rehab facility. Report called and given to [facility] LPN at facility. All of nurse's questions answered to her satisfaction. Pt left unit in wheelchair pushed by Nurse tech, Junie Panning, and met daughter outside at entrance. Daughter transporting pt to facility. Left in stable condition. No concerns voiced. ?

## 2021-07-13 ENCOUNTER — Encounter: Payer: Self-pay | Admitting: Vascular Surgery

## 2021-07-13 DIAGNOSIS — N1831 Chronic kidney disease, stage 3a: Secondary | ICD-10-CM | POA: Diagnosis not present

## 2021-07-13 DIAGNOSIS — F0150 Vascular dementia without behavioral disturbance: Secondary | ICD-10-CM | POA: Diagnosis not present

## 2021-07-13 DIAGNOSIS — I10 Essential (primary) hypertension: Secondary | ICD-10-CM

## 2021-07-13 DIAGNOSIS — M869 Osteomyelitis, unspecified: Secondary | ICD-10-CM | POA: Diagnosis not present

## 2021-07-13 DIAGNOSIS — I5032 Chronic diastolic (congestive) heart failure: Secondary | ICD-10-CM

## 2021-07-13 DIAGNOSIS — E1121 Type 2 diabetes mellitus with diabetic nephropathy: Secondary | ICD-10-CM | POA: Diagnosis not present

## 2021-07-13 LAB — SURGICAL PATHOLOGY

## 2021-07-14 LAB — AEROBIC/ANAEROBIC CULTURE W GRAM STAIN (SURGICAL/DEEP WOUND): Culture: NO GROWTH

## 2021-07-24 DIAGNOSIS — I739 Peripheral vascular disease, unspecified: Secondary | ICD-10-CM | POA: Diagnosis not present

## 2021-07-24 DIAGNOSIS — Z89422 Acquired absence of other left toe(s): Secondary | ICD-10-CM | POA: Diagnosis not present

## 2021-08-11 DIAGNOSIS — N1831 Chronic kidney disease, stage 3a: Secondary | ICD-10-CM

## 2021-08-11 DIAGNOSIS — F0150 Vascular dementia without behavioral disturbance: Secondary | ICD-10-CM | POA: Diagnosis not present

## 2021-08-11 DIAGNOSIS — E1121 Type 2 diabetes mellitus with diabetic nephropathy: Secondary | ICD-10-CM | POA: Diagnosis not present

## 2021-08-11 DIAGNOSIS — F39 Unspecified mood [affective] disorder: Secondary | ICD-10-CM | POA: Diagnosis not present

## 2021-08-11 DIAGNOSIS — Z89422 Acquired absence of other left toe(s): Secondary | ICD-10-CM

## 2021-08-11 DIAGNOSIS — I5032 Chronic diastolic (congestive) heart failure: Secondary | ICD-10-CM | POA: Diagnosis not present

## 2021-08-27 LAB — CBC AND DIFFERENTIAL
HCT: 35 — AB (ref 36–46)
Hemoglobin: 11.2 — AB (ref 12.0–16.0)
Neutrophils Absolute: 5366
Platelets: 151 10*3/uL (ref 150–400)
WBC: 7.6

## 2021-08-27 LAB — CBC: RBC: 3.97 (ref 3.87–5.11)

## 2021-08-27 LAB — COMPREHENSIVE METABOLIC PANEL: Calcium: 8.5 — AB (ref 8.7–10.7)

## 2021-08-27 LAB — HEMOGLOBIN A1C: Hemoglobin A1C: 8.3

## 2021-08-27 LAB — BASIC METABOLIC PANEL
BUN: 14 (ref 4–21)
CO2: 27 — AB (ref 13–22)
Chloride: 102 (ref 99–108)
Creatinine: 0.8 (ref 0.5–1.1)
Glucose: 133
Potassium: 4 mEq/L (ref 3.5–5.1)
Sodium: 138 (ref 137–147)

## 2021-09-09 DIAGNOSIS — K469 Unspecified abdominal hernia without obstruction or gangrene: Secondary | ICD-10-CM | POA: Diagnosis not present

## 2021-09-11 DIAGNOSIS — F0150 Vascular dementia without behavioral disturbance: Secondary | ICD-10-CM | POA: Diagnosis not present

## 2021-09-11 DIAGNOSIS — I5030 Unspecified diastolic (congestive) heart failure: Secondary | ICD-10-CM

## 2021-09-11 DIAGNOSIS — E1121 Type 2 diabetes mellitus with diabetic nephropathy: Secondary | ICD-10-CM

## 2021-09-11 DIAGNOSIS — Z89422 Acquired absence of other left toe(s): Secondary | ICD-10-CM | POA: Diagnosis not present

## 2021-09-11 DIAGNOSIS — I10 Essential (primary) hypertension: Secondary | ICD-10-CM

## 2021-09-11 DIAGNOSIS — F39 Unspecified mood [affective] disorder: Secondary | ICD-10-CM | POA: Diagnosis not present

## 2021-09-11 DIAGNOSIS — I872 Venous insufficiency (chronic) (peripheral): Secondary | ICD-10-CM | POA: Diagnosis not present

## 2021-09-11 DIAGNOSIS — N183 Chronic kidney disease, stage 3 unspecified: Secondary | ICD-10-CM

## 2021-10-12 DIAGNOSIS — F0150 Vascular dementia without behavioral disturbance: Secondary | ICD-10-CM | POA: Diagnosis not present

## 2021-10-12 DIAGNOSIS — I5032 Chronic diastolic (congestive) heart failure: Secondary | ICD-10-CM | POA: Diagnosis not present

## 2021-10-12 DIAGNOSIS — E1159 Type 2 diabetes mellitus with other circulatory complications: Secondary | ICD-10-CM | POA: Diagnosis not present

## 2021-10-12 DIAGNOSIS — F39 Unspecified mood [affective] disorder: Secondary | ICD-10-CM | POA: Diagnosis not present

## 2021-12-11 ENCOUNTER — Encounter: Payer: Self-pay | Admitting: Student

## 2021-12-11 ENCOUNTER — Non-Acute Institutional Stay (SKILLED_NURSING_FACILITY): Payer: Medicare Other | Admitting: Student

## 2021-12-11 DIAGNOSIS — G4700 Insomnia, unspecified: Secondary | ICD-10-CM

## 2021-12-11 DIAGNOSIS — F411 Generalized anxiety disorder: Secondary | ICD-10-CM

## 2021-12-11 NOTE — Progress Notes (Signed)
Location:   [facility] Room Number: 631 SHFWY of Service:  Nursing [phone]) Provider:  Dewayne Shorter, MD  Venia Carbon, MD  Patient Care Team: Venia Carbon, MD as PCP - General (Internal Medicine)  Extended Emergency Contact Information Primary Emergency Contact: Lisabeth Pick Mobile Phone: [phone] Relation: Daughter  Code Status:  DNAR Goals of care: Advanced Directive information    07/09/2021    5:17 PM  Advanced Directives  Does Patient Have a Medical Advance Directive? No  Type of Advance Directive Out of facility DNR (pink MOST or yellow form)  Does patient want to make changes to medical advance directive? No - Patient declined     No chief complaint on file.   HPI:  Pt is a 88 y.o. female seen today for an acute visit for poor sleep. Patient was told by podiatrist this morning it's normal for sleep to decline as we age. Validated this truth. Her daughter [name] is at bedside and provides parts of the history.   She has trouble falling asleep to the point she isn't sure what time she gets to sleep. Lies down at 9PM up at 7AM. She states she has thoughts that keep her up at night. Sometimes she falls asleep in the chair which helps. She has thoughts about current events such as the [facility]. She worries about her family. She prefers not to share all of the details surrounding her concerns. No other concerns today.  Past Medical History:  Diagnosis Date   AAA (abdominal aortic aneurysm) ([place])    Allergic state    Anemia    IRON DEF.ANEMIA   Arthritis    OSTEOARTHRITIS OF BIL.KNEES   B12 deficiency    Basal cell carcinoma    Callus of foot    bil.   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   Cataract cortical, senile    RIGHT EYE   Cholesterol-lowering agent myopathy 2004   Chronic diastolic heart failure (HCC)    Chronic kidney disease, stage 3a (HCC)    Diabetes mellitus without complication (HCC)    Diabetic neuropathy (HCC)     Diverticulosis    Drug-induced myopathy    Eczema    GERD (gastroesophageal reflux disease)    Gout    Hemorrhoids    History of chicken pox    History of hypercalcemia    Hypercholesterolemia    Hyperparathyroidism ([place])    Hypertension    Hypothyroidism    Incontinent of urine    Lumbago    Mycotic toenails    Neuropathy due to secondary diabetes mellitus ([place])    Wet senile macular degeneration ([place])    Past Surgical History:  Procedure Laterality Date   AMPUTATION TOE Left 07/09/2021   Procedure: AMPUTATION LEFT 2nd TOE;  Surgeon: Sharlotte Alamo, DPM;  Location: [facility] ORS;  Service: Podiatry;  Laterality: Left;   BUNIONECTOMY  2006   COLONOSCOPY     COLONOSCOPY WITH PROPOFOL N/A 12/09/2014   Procedure: COLONOSCOPY WITH PROPOFOL;  Surgeon: Lollie Sails, MD;  Location: [facility] ENDOSCOPY;  Service: Endoscopy;  Laterality: N/A;   COLONOSCOPY WITH PROPOFOL N/A 05/03/2017   Procedure: COLONOSCOPY WITH PROPOFOL;  Surgeon: Lollie Sails, MD;  Location: [facility] ENDOSCOPY;  Service: Endoscopy;  Laterality: N/A;   DEBRIDEMENT OF CALLUSES Bilateral    FEET   DIAGNOSTIC LAPAROSCOPY     ESOPHAGOGASTRODUODENOSCOPY     EYE SURGERY     GOUT SURGERY     KNEE  ARTHROSCOPY     LAPAROSCOPIC PARTIAL COLECTOMY Right 03/16/2010   LOWER EXTREMITY ANGIOGRAPHY Left 07/09/2021   Procedure: Lower Extremity Angiography;  Surgeon: Algernon Huxley, MD;  Location: [facility];  Service: Cardiovascular;  Laterality: Left;   MOHS SURGERY  12/2008   MOUTH SURGERY     PARATHYROIDECTOMY  2001   TONSILLECTOMY      Allergies  Allergen Reactions   Simvastatin     Other reaction(s): Other (See Comments) Other Reaction: Myositis   Ace Inhibitors     Other reaction(s): Cough   Angiotensin Receptor Blockers Cough    Other reaction(s): Cough   Lactose Other (See Comments)    intolerant   Losartan     Other reaction(s): Unknown   Penicillins Rash    Other reaction(s): Other (See Comments) Other  Reaction: ALLERGY 3/31 - patient does not recall being allergic to PCN or amoxicillin     Outpatient Encounter Medications as of 12/11/2021  Medication Sig   acetaminophen (TYLENOL) 325 MG tablet Take 2 tablets (650 mg total) by mouth every 4 (four) hours as needed for mild pain (or Fever >/= 101).   carvedilol (COREG) 6.25 MG tablet TAKE ONE TABLET BY MOUTH TWICE DAILY   cholecalciferol (VITAMIN D) 400 UNITS TABS tablet Take 1,000 Units by mouth.   Cyanocobalamin (VITAMIN B 12 PO) Take 1,000 mcg by mouth daily.   furosemide (LASIX) 20 MG tablet TAKE ONE TABLET BY MOUTH EVERY DAY   insulin degludec (TRESIBA FLEXTOUCH) 100 UNIT/ML FlexTouch Pen Inject 5 Units into the skin at bedtime.   Insulin Pen Needle 32G X 4 MM MISC 1 Dose by Does not apply route daily.   latanoprost (XALATAN) 0.005 % ophthalmic solution Place 1 drop into both eyes every evening.   metFORMIN (GLUCOPHAGE) 500 MG tablet TAKE ONE TABLET BY MOUTH TWICE DAILY   NYAMYC powder Apply topically.   potassium chloride (KLOR-CON) 10 MEQ tablet TAKE ONE TABLET BY MOUTH TWICE DAILY   sertraline (ZOLOFT) 25 MG tablet Take 100 mg by mouth daily.   [DISCONTINUED] cyanocobalamin (VITAMIN B12) 1000 MCG tablet Take by mouth.   [DISCONTINUED] furosemide (LASIX) 20 MG tablet Take 1 tablet by mouth daily.   [DISCONTINUED] insulin degludec (TRESIBA) 100 UNIT/ML FlexTouch Pen Inject 5 Units into the skin daily at 10 pm.   [DISCONTINUED] latanoprost (XALATAN) 0.005 % ophthalmic solution Apply to eye.   [DISCONTINUED] metFORMIN (GLUCOPHAGE) 500 MG tablet Take by mouth.   [DISCONTINUED] oxyCODONE (ROXICODONE) 5 MG immediate release tablet Take 1 tablet (5 mg total) by mouth every 8 (eight) hours as needed.   No facility-administered encounter medications on file as of 12/11/2021.    Review of Systems  Unable to perform ROS: Dementia  Psychiatric/Behavioral:         Patient stays up at night worrying and ruminating on issues. Doesn't feel  comfortable discussing them aloud today, however, daughter gives some insight.   All other systems reviewed and are negative.   Immunization History  Administered Date(s) Administered   Influenza Inj Mdck Quad Pf 01/20/2016   Influenza Split 02/04/2015   Influenza, High Dose Seasonal PF 01/11/2019   Influenza,inj,Quad PF,6+ Mos 02/10/2017   Influenza-Unspecified 01/02/2014, 01/26/2018, 01/24/2020   Moderna Sars-Covid-2 Vaccination 04/30/2019, 05/28/2019, 02/26/2020   PFIZER Comirnaty(Gray Top)Covid-19 Tri-Sucrose Vaccine 08/29/2020   Pneumococcal Polysaccharide-23 08/02/2014   Zoster Recombinat (Shingrix) 12/10/2017, 03/13/2018   Pertinent  Health Maintenance Due  Topic Date Due   FOOT EXAM  Never done   URINE  MICROALBUMIN  Never done   DEXA SCAN  Never done   OPHTHALMOLOGY EXAM  08/28/2021   INFLUENZA VACCINE  11/10/2021   HEMOGLOBIN A1C  01/07/2022      07/08/2021    7:53 PM 07/09/2021    7:35 AM 07/09/2021    8:05 PM 07/10/2021    7:51 AM 07/10/2021   10:05 PM  Fall Risk  Patient Fall Risk Level High fall risk High fall risk High fall risk High fall risk High fall risk   Functional Status Survey:    Vitals:   12/11/21 1713  BP: 110/66  Pulse: 79  Resp: 16  Temp: 97.8 F (36.6 C)  SpO2: 94%   There is no height or weight on file to calculate BMI. Physical Exam Constitutional:      Appearance: Normal appearance.  Cardiovascular:     Rate and Rhythm: Normal rate.  Pulmonary:     Effort: Pulmonary effort is normal.     Breath sounds: Normal breath sounds.  Skin:    General: Skin is warm and dry.  Neurological:     General: No focal deficit present.     Mental Status: She is alert. Mental status is at baseline.  Psychiatric:     Comments: Flat affect, short sentences, often uses one word answers.      Labs reviewed: Recent Labs    07/09/21 0722 07/09/21 1451 07/10/21 0818 07/11/21 0602  NA 137  --  138 137  K 3.3*  --  3.3* 3.7  CL 104  --  106 109   CO2 22  --  22 21*  GLUCOSE 297*  --  106* 176*  BUN '13 12 10 12  '$ CREATININE 0.91 0.73 0.77 0.70  CALCIUM 7.6*  --  7.8* 7.7*  MG 1.6*  --  1.9 1.8   No results for input(s): "AST", "ALT", "ALKPHOS", "BILITOT", "PROT", "ALBUMIN" in the last 8760 hours. Recent Labs    07/09/21 0722 07/10/21 0818 07/11/21 0602  WBC 10.2 13.0* 9.8  NEUTROABS 8.3* 10.6* 7.6  HGB 11.4* 11.7* 9.5*  HCT 37.3 38.7 31.2*  MCV 94.2 94.6 92.9  PLT 163 185 163   Lab Results  Component Value Date   TSH 6.306 (H) 01/16/2015   Lab Results  Component Value Date   HGBA1C 10.0 (H) 07/07/2021   No results found for: "CHOL", "HDL", "LDLCALC", "LDLDIRECT", "TRIG", "CHOLHDL"  Significant Diagnostic Results in last 30 days:  No results found.  Assessment/Plan 1. 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 Plan to discontinue given no effect. Discussed options of decreasing dose to 3 mg, trazodone, SSRI, as well as "z drugs" such as zolpidem and eszopiclone. Discussed concern for severity of adverse effects with "z drugs" and they agreed these meds are not in the best interest of the patient.   Also discussed benefits of PRN medication such as trazodone, however, would like to defer this trial at this time.   2. Generalized anxiety disorder Patient has been on sertraline 50 mg for 3 months and has significant improvement in symptoms of anxiety and depressed mood. Discussed with daughter her poor sleep could be as a result of under treated anxiety. Will increase to sertraline 100 mg daily. Discussed importance of talk therapy, patient declined. Also discussed importance of interaction and activity during the day to try to help with rumination issues. Will follow up in 1 month on mood and sleep.  Family/ staff Communication: Daughter, [name] and Nurse  Labs/tests ordered:  none

## 2021-12-17 DIAGNOSIS — F39 Unspecified mood [affective] disorder: Secondary | ICD-10-CM

## 2021-12-17 DIAGNOSIS — E1159 Type 2 diabetes mellitus with other circulatory complications: Secondary | ICD-10-CM

## 2021-12-17 DIAGNOSIS — F0150 Vascular dementia without behavioral disturbance: Secondary | ICD-10-CM

## 2021-12-17 DIAGNOSIS — I5032 Chronic diastolic (congestive) heart failure: Secondary | ICD-10-CM

## 2022-01-31 ENCOUNTER — Other Ambulatory Visit: Payer: Self-pay | Admitting: Orthopedic Surgery

## 2022-01-31 DIAGNOSIS — L03115 Cellulitis of right lower limb: Secondary | ICD-10-CM

## 2022-01-31 MED ORDER — DOXYCYCLINE HYCLATE 100 MG PO TABS: 100.0000 mg | ORAL_TABLET | Freq: Two times a day (BID) | ORAL | 0 refills | Status: DC

## 2022-01-31 NOTE — Progress Notes (Signed)
Nursing reports laceration to right lower leg with increased warmth, swelling and tenderness. Orders to start doxycycline 100 mg po BID x 7 days. Continue current dressing changes. Advised to notify Dr. Unk Lightning tomorrow.

## 2022-02-01 ENCOUNTER — Encounter: Payer: Self-pay | Admitting: Nurse Practitioner

## 2022-02-01 ENCOUNTER — Non-Acute Institutional Stay (SKILLED_NURSING_FACILITY): Payer: Medicare Other | Admitting: Nurse Practitioner

## 2022-02-01 DIAGNOSIS — T148XXA Other injury of unspecified body region, initial encounter: Secondary | ICD-10-CM | POA: Diagnosis not present

## 2022-02-01 DIAGNOSIS — L089 Local infection of the skin and subcutaneous tissue, unspecified: Secondary | ICD-10-CM | POA: Diagnosis not present

## 2022-02-01 NOTE — Progress Notes (Signed)
Careteam: Patient Care Team: Andrea Shorter, MD as PCP - General (Family Medicine) Place of service: [facility]   Advanced Directive information    Allergies  Allergen Reactions   Simvastatin     Other reaction(s): Other (See Comments) Other Reaction: Myositis   Ace Inhibitors     Other reaction(s): Cough   Angiotensin Receptor Blockers Cough    Other reaction(s): Cough   Lactose Other (See Comments)    intolerant   Losartan     Other reaction(s): Unknown   Penicillins Rash    Other reaction(s): Other (See Comments) Other Reaction: ALLERGY 3/31 - patient does not recall being allergic to PCN or amoxicillin     Chief Complaint  Patient presents with   Acute Visit    wound     HPI: Patient is a 88 y.o. female for acute visit due wound on right lower leg which occurred over the weekend. Unsure how the wound occurred.   Patient has dementia and can not tell how long the wound has been there. Nursing staff is unsure of how wound got there. There are two separate open locations in the same area.   Curently patient is on doxycycline per on call and nursing staff has been applying mepilex.   Wound care also following.   Pt reports some tenderness around wound. Areas is red.   Review of Systems:  Review of Systems  Unable to perform ROS: Dementia    Past Medical History:  Diagnosis Date   AAA (abdominal aortic aneurysm) ([facility])    Allergic state    Anemia    IRON DEF.ANEMIA   Arthritis    OSTEOARTHRITIS OF BIL.KNEES   B12 deficiency    Basal cell carcinoma    Callus of foot    bil.   Cancer ([place])    of CECUM   Cataract cortical, senile    RIGHT EYE   Cholesterol-lowering agent myopathy 2004   Chronic diastolic heart failure (HCC)    Chronic kidney disease, stage 3a (HCC)    Diabetes mellitus without complication (HCC)    Diabetic neuropathy (HCC)    Diverticulosis    Drug-induced myopathy    Eczema    GERD (gastroesophageal  reflux disease)    Gout    Hemorrhoids    History of chicken pox    History of hypercalcemia    Hypercholesterolemia    Hyperparathyroidism ([place])    Hypertension    Hypothyroidism    Incontinent of urine    Lumbago    Mycotic toenails    Neuropathy due to secondary diabetes mellitus ([place])    Wet senile macular degeneration ([place])    Past Surgical History:  Procedure Laterality Date   AMPUTATION TOE Left 07/09/2021   Procedure: AMPUTATION LEFT 2nd TOE;  Surgeon: Sharlotte Alamo, DPM;  Location: [facility] ORS;  Service: Podiatry;  Laterality: Left;   BUNIONECTOMY  2006   COLONOSCOPY     COLONOSCOPY WITH PROPOFOL N/A 12/09/2014   Procedure: COLONOSCOPY WITH PROPOFOL;  Surgeon: Lollie Sails, MD;  Location: [facility] END[facility];  Service: Endoscopy;  Laterality: N/A;   COLONOSCOPY WITH PROPOFOL N/A 05/03/2017   Procedure: COLONOSCOPY WITH PROPOFOL;  Surgeon: Lollie Sails, MD;  Location: [facility]COPY;  Service: Endoscopy;  Laterality: N/A;   DEBRIDEMENT OF CALLUSES Bilateral    FEET   DIAGNOSTIC LAPAROSCOPY     ESOPHAGOGASTRODUODENOSCOPY     EYE SURGERY     GOUT SURGERY     KNEE  ARTHROSCOPY     LAPAROSCOPIC PARTIAL COLECTOMY Right 03/16/2010   LOWER EXTREMITY ANGIOGRAPHY Left 07/09/2021   Procedure: Lower Extremity Angiography;  Surgeon: Algernon Huxley, MD;  Location: [facility];  Service: Cardiovascular;  Laterality: Left;   MOHS SURGERY  12/2008   MOUTH SURGERY     PARATHYROIDECTOMY  2001   TONSILLECTOMY     Social History:   reports that she has quit smoking. She has never used smokeless tobacco. She reports current alcohol use. She reports that she does not use drugs.  History reviewed. No pertinent family history.  Medications: Patient's Medications  New Prescriptions   No medications on file  Previous Medications   ACETAMINOPHEN (TYLENOL) 325 MG TABLET    Take 2 tablets (650 mg total) by mouth every 4 (four) hours as needed for mild pain (or Fever >/= 101).    CARVEDILOL (COREG) 6.25 MG TABLET    TAKE ONE TABLET BY MOUTH TWICE DAILY   CHOLECALCIFEROL (VITAMIN D) 400 UNITS TABS TABLET    Take 1,000 Units by mouth.   CYANOCOBALAMIN (VITAMIN B 12 PO)    Take 1,000 mcg by mouth daily.   DOXYCYCLINE (VIBRA-TABS) 100 MG TABLET    Take 1 tablet (100 mg total) by mouth 2 (two) times daily.   FUROSEMIDE (LASIX) 20 MG TABLET    TAKE ONE TABLET BY MOUTH EVERY DAY   HYDROCORTISONE 2.5 % CREAM    Apply to groin area every 12 hours as needed.   INFANT CARE PRODUCTS ([employer]) OINT    Apply to Left Buttock three times daily.   INSULIN DEGLUDEC (TRESIBA FLEXTOUCH) 100 UNIT/ML FLEXTOUCH PEN    Inject 5 Units into the skin at bedtime.   INSULIN PEN NEEDLE 32G X 4 MM MISC    1 Dose by Does not apply route daily.   LATANOPROST (XALATAN) 0.005 % OPHTHALMIC SOLUTION    Place 1 drop into both eyes every evening.   METFORMIN (GLUCOPHAGE) 500 MG TABLET    TAKE ONE TABLET BY MOUTH TWICE DAILY   NYAMYC POWDER    Apply topically.   POTASSIUM CHLORIDE (KLOR-CON) 10 MEQ TABLET    TAKE ONE TABLET BY MOUTH TWICE DAILY   SERTRALINE (ZOLOFT) 100 MG TABLET    Take 100 mg by mouth daily.  Modified Medications   No medications on file  Discontinued Medications   SERTRALINE (ZOLOFT) 25 MG TABLET    Take 100 mg by mouth daily.    Physical Exam:  There were no vitals filed for this visit. There is no height or weight on file to calculate BMI. Wt Readings from Last 3 Encounters:  07/09/21 150 lb (68 kg)  05/29/21 144 lb (65.3 kg)  02/26/21 143 lb 11.2 oz (65.2 kg)    Physical Exam Constitutional:      General: She is not in acute distress. Cardiovascular:     Rate and Rhythm: Normal rate and regular rhythm.     Pulses: Normal pulses.     Heart sounds: Normal heart sounds. No murmur heard. Pulmonary:     Effort: Pulmonary effort is normal. No respiratory distress.     Breath sounds: Normal breath sounds. No wheezing.  Skin:    Findings: Abrasion present.     Comments: ~  1 inch Abrasion on rt proximal calf. Superficial with no tunneling. Eschar with local surrounding erythema. No edema. Tenderness is present.  ~2 cm abrasion on rt proximal cath, located beneath above noted abrasion. Pink bed with no  tunneling. No drainage. No edema. Tenderness is present.    Neurological:     Mental Status: She is alert. Mental status is at baseline. She is disoriented.     Labs reviewed: Basic Metabolic Panel: Recent Labs    07/09/21 0722 07/09/21 1451 07/10/21 0818 07/11/21 0602  NA 137  --  138 137  K 3.3*  --  3.3* 3.7  CL 104  --  106 109  CO2 22  --  22 21*  GLUCOSE 297*  --  106* 176*  BUN '13 12 10 12  '$ CREATININE 0.91 0.73 0.77 0.70  CALCIUM 7.6*  --  7.8* 7.7*  MG 1.6*  --  1.9 1.8   Liver Function Tests: No results for input(s): "AST", "ALT", "ALKPHOS", "BILITOT", "PROT", "ALBUMIN" in the last 8760 hours. No results for input(s): "LIPASE", "AMYLASE" in the last 8760 hours. No results for input(s): "AMMONIA" in the last 8760 hours. CBC: Recent Labs    07/09/21 0722 07/10/21 0818 07/11/21 0602  WBC 10.2 13.0* 9.8  NEUTROABS 8.3* 10.6* 7.6  HGB 11.4* 11.7* 9.5*  HCT 37.3 38.7 31.2*  MCV 94.2 94.6 92.9  PLT 163 185 163   Lipid Panel: No results for input(s): "CHOL", "HDL", "LDLCALC", "TRIG", "CHOLHDL", "LDLDIRECT" in the last 8760 hours. TSH: No results for input(s): "TSH" in the last 8760 hours. A1C: Lab Results  Component Value Date   HGBA1C 10.0 (H) 07/07/2021     Assessment/Plan 1. Infected abrasion - Continue doxycycline as prescribed - Apply Silvadene with dressing, change daily and wound care to follow up.  -to notify as needed for worsening redness or drainage.    Student- Waunita Schooner, RN -I personally was present during the history, physical exam and medical decision-making activities of this service and have verified that the service and findings are accurately documented in the student's note  Sharetha Newson K. Ludden,  [facility] [phone]

## 2022-02-01 NOTE — Progress Notes (Deleted)
Location:   [place] of Service:   [place]  Dewayne Shorter, MD  Patient Care Team: Dewayne Shorter, MD as PCP - General [facility])  Extended Emergency Contact Information Primary Emergency Contact: Lisabeth Pick Mobile Phone: [phone] Relation: Daughter  Goals of care: Advanced Directive information    07/09/2021    5:17 PM  Advanced Directives  Does Patient Have a Medical Advance Directive? No  Type of Advance Directive Out of facility DNR (pink MOST or yellow form)  Does patient want to make changes to medical advance directive? No - Patient declined     Chief Complaint  Patient presents with   Acute Visit    wound    HPI:  Pt is a 88 y.o. female seen today for an acute visit for    Past Medical History:  Diagnosis Date   AAA (abdominal aortic aneurysm) ([place])    Allergic state    Anemia    IRON DEF.ANEMIA   Arthritis    OSTEOARTHRITIS OF BIL.KNEES   B12 deficiency    Basal cell carcinoma    Callus of foot    bil.   Cancer ([place])    of CECUM   Cataract cortical, senile    RIGHT EYE   Cholesterol-lowering agent myopathy 2004   Chronic diastolic heart failure (HCC)    Chronic kidney disease, stage 3a (HCC)    Diabetes mellitus without complication (HCC)    Diabetic neuropathy (HCC)    Diverticulosis    Drug-induced myopathy    Eczema    GERD (gastroesophageal reflux disease)    Gout    Hemorrhoids    History of chicken pox    History of hypercalcemia    Hypercholesterolemia    Hyperparathyroidism ([place])    Hypertension    Hypothyroidism    Incontinent of urine    Lumbago    Mycotic toenails    Neuropathy due to secondary diabetes mellitus ([place])    Wet senile macular degeneration ([place])    Past Surgical History:  Procedure Laterality Date   AMPUTATION TOE Left 07/09/2021   Procedure: AMPUTATION LEFT 2nd TOE;  Surgeon: Sharlotte Alamo, DPM;  Location: [facility] ORS;  Service: Podiatry;  Laterality: Left;    BUNIONECTOMY  2006   COLONOSCOPY     COLONOSCOPY WITH PROPOFOL N/A 12/09/2014   Procedure: COLONOSCOPY WITH PROPOFOL;  Surgeon: Lollie Sails, MD;  Location: [facility] ENDOSCOPY;  Service: Endoscopy;  Laterality: N/A;   COLONOSCOPY WITH PROPOFOL N/A 05/03/2017   Procedure: COLONOSCOPY WITH PROPOFOL;  Surgeon: Lollie Sails, MD;  Location: [facility] ENDOSCOPY;  Service: Endoscopy;  Laterality: N/A;   DEBRIDEMENT OF CALLUSES Bilateral    FEET   DIAGNOSTIC LAPAROSCOPY     ESOPHAGOGASTRODUODENOSCOPY     EYE SURGERY     GOUT SURGERY     KNEE ARTHROSCOPY     LAPAROSCOPIC PARTIAL COLECTOMY Right 03/16/2010   LOWER EXTREMITY ANGIOGRAPHY Left 07/09/2021   Procedure: Lower Extremity Angiography;  Surgeon: Algernon Huxley, MD;  Location: [place] [facility] LAB;  Service: Cardiovascular;  Laterality: Left;   MOHS SURGERY  12/2008   MOUTH SURGERY     PARATHYROIDECTOMY  2001   TONSILLECTOMY      Allergies  Allergen Reactions   Simvastatin     Other reaction(s): Other (See Comments) Other Reaction: Myositis   Ace Inhibitors     Other reaction(s): Cough   Angiotensin Receptor Blockers Cough    Other reaction(s): Cough  Lactose Other (See Comments)    intolerant   Losartan     Other reaction(s): Unknown   Penicillins Rash    Other reaction(s): Other (See Comments) Other Reaction: ALLERGY 3/31 - patient does not recall being allergic to PCN or amoxicillin     Outpatient Encounter Medications as of 02/01/2022  Medication Sig   acetaminophen (TYLENOL) 325 MG tablet Take 2 tablets (650 mg total) by mouth every 4 (four) hours as needed for mild pain (or Fever >/= 101).   carvedilol (COREG) 6.25 MG tablet TAKE ONE TABLET BY MOUTH TWICE DAILY   cholecalciferol (VITAMIN D) 400 UNITS TABS tablet Take 1,000 Units by mouth.   Cyanocobalamin (VITAMIN B 12 PO) Take 1,000 mcg by mouth daily.   doxycycline (VIBRA-TABS) 100 MG tablet Take 1 tablet (100 mg total) by mouth 2 (two) times daily.   furosemide  (LASIX) 20 MG tablet TAKE ONE TABLET BY MOUTH EVERY DAY   insulin degludec (TRESIBA FLEXTOUCH) 100 UNIT/ML FlexTouch Pen Inject 5 Units into the skin at bedtime.   Insulin Pen Needle 32G X 4 MM MISC 1 Dose by Does not apply route daily.   latanoprost (XALATAN) 0.005 % ophthalmic solution Place 1 drop into both eyes every evening.   metFORMIN (GLUCOPHAGE) 500 MG tablet TAKE ONE TABLET BY MOUTH TWICE DAILY   NYAMYC powder Apply topically.   potassium chloride (KLOR-CON) 10 MEQ tablet TAKE ONE TABLET BY MOUTH TWICE DAILY   sertraline (ZOLOFT) 25 MG tablet Take 100 mg by mouth daily.   No facility-administered encounter medications on file as of 02/01/2022.    Review of Systems  Immunization History  Administered Date(s) Administered   Influenza Inj Mdck Quad Pf 01/20/2016   Influenza Split 02/04/2015   Influenza, High Dose Seasonal PF 01/11/2019   Influenza,inj,Quad PF,6+ Mos 02/10/2017   Influenza-Unspecified 01/02/2014, 01/26/2018, 01/24/2020   Moderna Sars-Covid-2 Vaccination 04/30/2019, 05/28/2019, 02/26/2020   PFIZER Comirnaty(Gray Top)Covid-19 Tri-Sucrose Vaccine 08/29/2020   Pneumococcal Polysaccharide-23 08/02/2014   Zoster Recombinat (Shingrix) 12/10/2017, 03/13/2018   Pertinent  Health Maintenance Due  Topic Date Due   FOOT EXAM  Never done   DEXA SCAN  Never done   OPHTHALMOLOGY EXAM  08/28/2021   INFLUENZA VACCINE  11/10/2021   HEMOGLOBIN A1C  01/07/2022      07/08/2021    7:53 PM 07/09/2021    7:35 AM 07/09/2021    8:05 PM 07/10/2021    7:51 AM 07/10/2021   10:05 PM  Fall Risk  Patient Fall Risk Level High fall risk High fall risk High fall risk High fall risk High fall risk   Functional Status Survey:    There were no vitals filed for this visit. There is no height or weight on file to calculate BMI. Physical Exam  Labs reviewed: Recent Labs    07/09/21 0722 07/09/21 1451 07/10/21 0818 07/11/21 0602  NA 137  --  138 137  K 3.3*  --  3.3* 3.7  CL 104   --  106 109  CO2 22  --  22 21*  GLUCOSE 297*  --  106* 176*  BUN '13 12 10 12  '$ CREATININE 0.91 0.73 0.77 0.70  CALCIUM 7.6*  --  7.8* 7.7*  MG 1.6*  --  1.9 1.8   No results for input(s): "AST", "ALT", "ALKPHOS", "BILITOT", "PROT", "ALBUMIN" in the last 8760 hours. Recent Labs    07/09/21 0722 07/10/21 0818 07/11/21 0602  WBC 10.2 13.0* 9.8  NEUTROABS 8.3* 10.6* 7.6  HGB  11.4* 11.7* 9.5*  HCT 37.3 38.7 31.2*  MCV 94.2 94.6 92.9  PLT 163 185 163   Lab Results  Component Value Date   TSH 6.306 (H) 01/16/2015   Lab Results  Component Value Date   HGBA1C 10.0 (H) 07/07/2021   No results found for: "CHOL", "HDL", "LDLCALC", "LDLDIRECT", "TRIG", "CHOLHDL"  Significant Diagnostic Results in last 30 days:  No results found.  Assessment/Plan There are no diagnoses linked to this encounter.   Carlos American. King, [facility] [phone]

## 2022-02-08 ENCOUNTER — Encounter (INDEPENDENT_AMBULATORY_CARE_PROVIDER_SITE_OTHER): Payer: Self-pay

## 2022-02-10 ENCOUNTER — Non-Acute Institutional Stay (SKILLED_NURSING_FACILITY): Payer: Medicare Other | Admitting: Student

## 2022-02-10 ENCOUNTER — Encounter: Payer: Self-pay | Admitting: Student

## 2022-02-10 DIAGNOSIS — I2510 Atherosclerotic heart disease of native coronary artery without angina pectoris: Secondary | ICD-10-CM | POA: Diagnosis not present

## 2022-02-10 DIAGNOSIS — Z5189 Encounter for other specified aftercare: Secondary | ICD-10-CM | POA: Diagnosis not present

## 2022-02-10 NOTE — Progress Notes (Signed)
Location:  [facility] Room Number: 885-O Place of Service:  SNF [phone]) Provider:  Dewayne Shorter, MD  Patient Care Team: Dewayne Shorter, MD as PCP - General (Family Medicine)  Extended Emergency Contact Information Primary Emergency Contact: Lisabeth Pick Mobile Phone: [phone] Relation: Daughter  Code Status:  DNR Goals of care: Advanced Directive information    02/10/2022    3:09 PM  Advanced Directives  Does Patient Have a Medical Advance Directive? Yes  Type of Advance Directive Out of facility DNR (pink MOST or yellow form)  Does patient want to make changes to medical advance directive? No - Patient declined  Pre-existing out of facility DNR order (yellow form or pink MOST form) Yellow form placed in chart (order not valid for inpatient use)     Chief Complaint  Patient presents with   Acute Visit    Acute concerns, vitals and medications are a reflection of [facility]MR system, [employer].      HPI:  Pt is a 88 y.o. female seen today for an acute visit for wound of the right lower extremity. Denies pain, fevers, chills, n/v. Patient states she has been her normal self. She does not recall how she aquired the injury.   Nursing state they are concerned about the wound. They have been cleaning with hibiclens and placing silvadene on top. No improvement, however, no worsening of the wound.     Past Medical History:  Diagnosis Date   AAA (abdominal aortic aneurysm) ([place])    Allergic state    Anemia    IRON DEF.ANEMIA   Arthritis    OSTEOARTHRITIS OF BIL.KNEES   B12 deficiency    Basal cell carcinoma    Callus of foot    bil.   Cancer ([place])    of CECUM   Cataract cortical, senile    RIGHT EYE   Cholesterol-lowering agent myopathy 2004   Chronic diastolic heart failure (HCC)    Chronic kidney disease, stage 3a (HCC)    Diabetes mellitus without complication (HCC)    Diabetic neuropathy (HCC)    Diverticulosis     Drug-induced myopathy    Eczema    GERD (gastroesophageal reflux disease)    Gout    Hemorrhoids    History of chicken pox    History of hypercalcemia    Hypercholesterolemia    Hyperparathyroidism ([place])    Hypertension    Hypothyroidism    Incontinent of urine    Lumbago    Mycotic toenails    Neuropathy due to secondary diabetes mellitus ([place])    Wet senile macular degeneration ([place])    Past Surgical History:  Procedure Laterality Date   AMPUTATION TOE Left 07/09/2021   Procedure: AMPUTATION LEFT 2nd TOE;  Surgeon: Sharlotte Alamo, DPM;  Location: [facility] ORS;  Service: Podiatry;  Laterality: Left;   BUNIONECTOMY  2006   COLONOSCOPY     COLONOSCOPY WITH PROPOFOL N/A 12/09/2014   Procedure: COLONOSCOPY WITH PROPOFOL;  Surgeon: Lollie Sails, MD;  Location: [facility] ENDOSCOPY;  Service: Endoscopy;  Laterality: N/A;   COLONOSCOPY WITH PROPOFOL N/A 05/03/2017   Procedure: COLONOSCOPY WITH PROPOFOL;  Surgeon: Lollie Sails, MD;  Location: [facility] ENDOSCOPY;  Service: Endoscopy;  Laterality: N/A;   DEBRIDEMENT OF CALLUSES Bilateral    FEET   DIAGNOSTIC LAPAROSCOPY     ESOPHAGOGASTRODUODENOSCOPY     EYE SURGERY     GOUT SURGERY     KNEE ARTHROSCOPY     LAPAROSCOPIC PARTIAL  COLECTOMY Right 03/16/2010   LOWER EXTREMITY ANGIOGRAPHY Left 07/09/2021   Procedure: Lower Extremity Angiography;  Surgeon: Algernon Huxley, MD;  Location: [place] CV LAB;  Service: Cardiovascular;  Laterality: Left;   MOHS SURGERY  12/2008   MOUTH SURGERY     PARATHYROIDECTOMY  2001   TONSILLECTOMY      Allergies  Allergen Reactions   Simvastatin     Other reaction(s): Other (See Comments) Other Reaction: Myositis   Ace Inhibitors     Other reaction(s): Cough   Angiotensin Receptor Blockers Cough    Other reaction(s): Cough   Lactose Other (See Comments)    intolerant   Losartan     Other reaction(s): Unknown   Penicillins Rash    Other reaction(s): Other (See Comments) Other Reaction: ALLERGY 3/31  - patient does not recall being allergic to PCN or amoxicillin     Outpatient Encounter Medications as of 02/10/2022  Medication Sig   acetaminophen (TYLENOL) 325 MG tablet Take 2 tablets (650 mg total) by mouth every 4 (four) hours as needed for mild pain (or Fever >/= 101).   carvedilol (COREG) 6.25 MG tablet TAKE ONE TABLET BY MOUTH TWICE DAILY   Cholecalciferol (VITAMIN D3) 1000 units CAPS Take 1 capsule by mouth daily.   Cyanocobalamin (VITAMIN B 12 PO) Take 1,000 mcg by mouth daily.   furosemide (LASIX) 20 MG tablet TAKE ONE TABLET BY MOUTH EVERY DAY   hydrocortisone 2.5 % cream Apply to groin area every 12 hours as needed.   Infant Care Products [facility]) OINT Apply to Left Buttock three times daily.   insulin degludec (TRESIBA FLEXTOUCH) 100 UNIT/ML FlexTouch Pen Inject 5 Units into the skin at bedtime.   latanoprost (XALATAN) 0.005 % ophthalmic solution Place 1 drop into both eyes every evening.   metFORMIN (GLUCOPHAGE) 500 MG tablet TAKE ONE TABLET BY MOUTH TWICE DAILY   NYAMYC powder Apply topically.   potassium chloride (KLOR-CON) 10 MEQ tablet TAKE ONE TABLET BY MOUTH TWICE DAILY   sertraline (ZOLOFT) 100 MG tablet Take 100 mg by mouth daily.   silver sulfADIAZINE (SILVADENE) 1 % cream Apply 1 Application topically daily. Apply to right lower leg wound topically every day shift for right leg wound clean with hebi cleanse 4%   Insulin Pen Needle 32G X 4 MM MISC 1 Dose by Does not apply route daily.   [DISCONTINUED] cholecalciferol (VITAMIN D) 400 UNITS TABS tablet Take 1,000 Units by mouth.   [DISCONTINUED] doxycycline (VIBRA-TABS) 100 MG tablet Take 1 tablet (100 mg total) by mouth 2 (two) times daily. (Patient not taking: Reported on 02/10/2022)   No facility-administered encounter medications on file as of 02/10/2022.    Review of Systems  Immunization History  Administered Date(s) Administered   Influenza Inj Mdck Quad Pf 01/20/2016   Influenza Split 02/04/2015    Influenza, High Dose Seasonal PF 01/11/2019   Influenza,inj,Quad PF,6+ Mos 02/10/2017   Influenza-Unspecified 01/02/2014, 01/26/2018, 01/24/2020, 01/26/2022   Moderna Sars-Covid-2 Vaccination 04/30/2019, 05/28/2019, 02/26/2020   PFIZER Comirnaty(Gray Top)Covid-19 Tri-Sucrose Vaccine 08/29/2020   Pneumococcal Polysaccharide-23 08/02/2014   Zoster Recombinat (Shingrix) 12/10/2017, 03/13/2018   Pertinent  Health Maintenance Due  Topic Date Due   FOOT EXAM  Never done   DEXA SCAN  Never done   OPHTHALMOLOGY EXAM  08/28/2021   HEMOGLOBIN A1C  01/07/2022   INFLUENZA VACCINE  Completed      07/08/2021    7:53 PM 07/09/2021    7:35 AM 07/09/2021    8:05 PM  07/10/2021    7:51 AM 07/10/2021   10:05 PM  Fall Risk  Patient Fall Risk Level High fall risk High fall risk High fall risk High fall risk High fall risk   Functional Status Survey:    Vitals:   02/10/22 1459  BP: 133/75  Pulse: 73  Resp: 16  Temp: (!) 96.4 F (35.8 C)  Weight: 137 lb 6.4 oz (62.3 kg)  Height: 5' (1.524 m)   Body mass index is 26.83 kg/m. Physical Exam Skin:    Comments: RLE with wound with native skin intact. Covered in silvadene.  Neurological:     Mental Status: She is alert.     Labs reviewed: Recent Labs    07/09/21 0722 07/09/21 1451 07/10/21 0818 07/11/21 0602 08/27/21 0000  NA 137  --  138 137 138  K 3.3*  --  3.3* 3.7 4.0  CL 104  --  106 109 102  CO2 22  --  22 21* 27*  GLUCOSE 297*  --  106* 176*  --   BUN 13   < > '10 12 14  '$ CREATININE 0.91   < > 0.77 0.70 0.8  CALCIUM 7.6*  --  7.8* 7.7* 8.5*  MG 1.6*  --  1.9 1.8  --    < > = values in this interval not displayed.   No results for input(s): "AST", "ALT", "ALKPHOS", "BILITOT", "PROT", "ALBUMIN" in the last 8760 hours. Recent Labs    07/09/21 0722 07/10/21 0818 07/11/21 0602 08/27/21 0000  WBC 10.2 13.0* 9.8 7.6  NEUTROABS 8.3* 10.6* 7.6 5,366.00  HGB 11.4* 11.7* 9.5* 11.2*  HCT 37.3 38.7 31.2* 35*  MCV 94.2 94.6 92.9   --   PLT 163 185 163 151   Lab Results  Component Value Date   TSH 6.306 (H) 01/16/2015   Lab Results  Component Value Date   HGBA1C 10.0 (H) 07/07/2021   No results found for: "CHOL", "HDL", "LDLCALC", "LDLDIRECT", "TRIG", "CHOLHDL"  Significant Diagnostic Results in last 30 days:  No results found.  Assessment/Plan 1. Visit for wound check Patient has a wound for 10 days that is not improving. Skin tear due to unknown cause. Plan to change cleansing to sterile NS and apply vaseline to the area. No surrounding erythema or tenderness concerning for cellulitis at this time. Will plan to follow up early next week to reassess.   2. Coronary artery disease involving native coronary artery of native heart without angina pectoris Patient maintains DNR status  Family/ staff Communication: nursgin  Labs/tests ordered:  none

## 2022-02-15 ENCOUNTER — Non-Acute Institutional Stay (SKILLED_NURSING_FACILITY): Payer: Medicare Other | Admitting: Student

## 2022-02-15 ENCOUNTER — Encounter: Payer: Self-pay | Admitting: Student

## 2022-02-15 DIAGNOSIS — Z5189 Encounter for other specified aftercare: Secondary | ICD-10-CM | POA: Diagnosis not present

## 2022-02-15 NOTE — Progress Notes (Unsigned)
Location:  Other [facility]) Nursing Home Room Number: 696-V Place of Service:  SNF [phone]) Provider:  Dewayne Shorter, MD  Patient Care Team: Dewayne Shorter, MD as PCP - General (Family Medicine)  Extended Emergency Contact Information Primary Emergency Contact: Lisabeth Pick Mobile Phone: [phone] Relation: Daughter  Code Status:  DNR Goals of care: Advanced Directive information    02/15/2022    1:29 PM  Advanced Directives  Does Patient Have a Medical Advance Directive? Yes  Type of Advance Directive Out of facility DNR (pink MOST or yellow form)  Does patient want to make changes to medical advance directive? No - Patient declined  Pre-existing out of facility DNR order (yellow form or pink MOST form) Yellow form placed in chart (order not valid for inpatient use)     Chief Complaint  Patient presents with   Acute Visit    Acute concerns. Vitals and medications are a reflection of [facility]MR system, [employer] [facility]      HPI:  Pt is a 88 y.o. female seen today for an acute visit for wound check. Patient states she is in no pain. Denies any other symptoms.    Past Medical History:  Diagnosis Date   AAA (abdominal aortic aneurysm) ([place])    Allergic state    Anemia    IRON DEF.ANEMIA   Arthritis    OSTEOARTHRITIS OF BIL.KNEES   B12 deficiency    Basal cell carcinoma    Callus of foot    bil.   Cancer ([employer])    of CECUM   Cataract cortical, senile    RIGHT EYE   Cholesterol-lowering agent myopathy 2004   Chronic diastolic heart failure (HCC)    Chronic kidney disease, stage 3a (HCC)    Diabetes mellitus without complication (HCC)    Diabetic neuropathy (HCC)    Diverticulosis    Drug-induced myopathy    Eczema    GERD (gastroesophageal reflux disease)    Gout    Hemorrhoids    History of chicken pox    History of hypercalcemia    Hypercholesterolemia    Hyperparathyroidism ([place])    Hypertension    Hypothyroidism    Incontinent of urine     Lumbago    Mycotic toenails    Neuropathy due to secondary diabetes mellitus ([place])    Wet senile macular degeneration ([place])    Past Surgical History:  Procedure Laterality Date   AMPUTATION TOE Left 07/09/2021   Procedure: AMPUTATION LEFT 2nd TOE;  Surgeon: Sharlotte Alamo, DPM;  Location: [facility] ORS;  Service: Podiatry;  Laterality: Left;   BUNIONECTOMY  2006   COLONOSCOPY     COLONOSCOPY WITH PROPOFOL N/A 12/09/2014   Procedure: COLONOSCOPY WITH PROPOFOL;  Surgeon: Lollie Sails, MD;  Location: [facility] ENDOSCOPY;  Service: Endoscopy;  Laterality: N/A;   COLONOSCOPY WITH PROPOFOL N/A 05/03/2017   Procedure: COLONOSCOPY WITH PROPOFOL;  Surgeon: Lollie Sails, MD;  Location: [facility] ENDOSCOPY;  Service: Endoscopy;  Laterality: N/A;   DEBRIDEMENT OF CALLUSES Bilateral    FEET   DIAGNOSTIC LAPAROSCOPY     ESOPHAGOGASTRODUODENOSCOPY     EYE SURGERY     GOUT SURGERY     KNEE ARTHROSCOPY     LAPAROSCOPIC PARTIAL COLECTOMY Right 03/16/2010   LOWER EXTREMITY ANGIOGRAPHY Left 07/09/2021   Procedure: Lower Extremity Angiography;  Surgeon: Algernon Huxley, MD;  Location: [place] CV LAB;  Service: Cardiovascular;  Laterality: Left;   MOHS SURGERY  12/2008   MOUTH SURGERY  PARATHYROIDECTOMY  2001   TONSILLECTOMY      Allergies  Allergen Reactions   Simvastatin     Other reaction(s): Other (See Comments) Other Reaction: Myositis   Ace Inhibitors     Other reaction(s): Cough   Angiotensin Receptor Blockers Cough    Other reaction(s): Cough   Lactose Other (See Comments)    intolerant   Losartan     Other reaction(s): Unknown   Penicillins Rash    Other reaction(s): Other (See Comments) Other Reaction: ALLERGY 3/31 - patient does not recall being allergic to PCN or amoxicillin     Outpatient Encounter Medications as of 02/15/2022  Medication Sig   acetaminophen (TYLENOL) 325 MG tablet Take 2 tablets (650 mg total) by mouth every 4 (four) hours as needed for mild pain (or Fever  >/= 101).   carvedilol (COREG) 6.25 MG tablet TAKE ONE TABLET BY MOUTH TWICE DAILY   Cholecalciferol (VITAMIN D3) 1000 units CAPS Take 1 capsule by mouth daily.   Cyanocobalamin (VITAMIN B 12 PO) Take 1,000 mcg by mouth daily.   furosemide (LASIX) 20 MG tablet TAKE ONE TABLET BY MOUTH EVERY DAY   hydrocortisone 2.5 % cream Apply to groin area every 12 hours as needed.   Infant Care Products [facility]) OINT Apply to Left Buttock three times daily.   insulin degludec (TRESIBA FLEXTOUCH) 100 UNIT/ML FlexTouch Pen Inject 5 Units into the skin at bedtime.   Insulin Pen Needle 32G X 4 MM MISC 1 Dose by Does not apply route daily.   latanoprost (XALATAN) 0.005 % ophthalmic solution Place 1 drop into both eyes every evening.   metFORMIN (GLUCOPHAGE) 500 MG tablet TAKE ONE TABLET BY MOUTH TWICE DAILY   potassium chloride (KLOR-CON) 10 MEQ tablet TAKE ONE TABLET BY MOUTH TWICE DAILY   sertraline (ZOLOFT) 100 MG tablet Take 100 mg by mouth daily.   [DISCONTINUED] [facility] powder Apply topically.   [DISCONTINUED] silver sulfADIAZINE (SILVADENE) 1 % cream Apply 1 Application topically daily. Apply to right lower leg wound topically every day shift for right leg wound clean with hebi cleanse 4%   No facility-administered encounter medications on file as of 02/15/2022.    Review of Systems  Immunization History  Administered Date(s) Administered   Influenza Inj Mdck Quad Pf 01/20/2016   Influenza Split 02/04/2015   Influenza, High Dose Seasonal PF 01/11/2019   Influenza,inj,Quad PF,6+ Mos 02/10/2017   Influenza-Unspecified 01/02/2014, 01/26/2018, 01/24/2020, 01/26/2022   Moderna Sars-Covid-2 Vaccination 04/30/2019, 05/28/2019, 02/26/2020   PFIZER Comirnaty(Gray Top)Covid-19 Tri-Sucrose Vaccine 08/29/2020   Pneumococcal Polysaccharide-23 08/02/2014   Zoster Recombinat (Shingrix) 12/10/2017, 03/13/2018   Pertinent  Health Maintenance Due  Topic Date Due   DEXA SCAN  Never done   OPHTHALMOLOGY EXAM   08/28/2021   HEMOGLOBIN A1C  02/27/2022   FOOT EXAM  01/01/2023   INFLUENZA VACCINE  Completed      07/08/2021    7:53 PM 07/09/2021    7:35 AM 07/09/2021    8:05 PM 07/10/2021    7:51 AM 07/10/2021   10:05 PM  Fall Risk  Patient Fall Risk Level High fall risk High fall risk High fall risk High fall risk High fall risk   Functional Status Survey:    Vitals:   02/15/22 1315  BP: 115/73  Pulse: 69  Weight: 137 lb 6.4 oz (62.3 kg)  Height: 5' (1.524 m)   Body mass index is 26.83 kg/m. Physical Exam Skin:    General: Skin is warm and dry.  Comments: Photo below  Neurological:     Mental Status: She is alert.   \   Labs reviewed: Recent Labs    07/09/21 0722 07/09/21 1451 07/10/21 0818 07/11/21 0602 08/27/21 0000  NA 137  --  138 137 138  K 3.3*  --  3.3* 3.7 4.0  CL 104  --  106 109 102  CO2 22  --  22 21* 27*  GLUCOSE 297*  --  106* 176*  --   BUN 13   < > '10 12 14  '$ CREATININE 0.91   < > 0.77 0.70 0.8  CALCIUM 7.6*  --  7.8* 7.7* 8.5*  MG 1.6*  --  1.9 1.8  --    < > = values in this interval not displayed.   No results for input(s): "AST", "ALT", "ALKPHOS", "BILITOT", "PROT", "ALBUMIN" in the last 8760 hours. Recent Labs    07/09/21 0722 07/10/21 0818 07/11/21 0602 08/27/21 0000  WBC 10.2 13.0* 9.8 7.6  NEUTROABS 8.3* 10.6* 7.6 5,366.00  HGB 11.4* 11.7* 9.5* 11.2*  HCT 37.3 38.7 31.2* 35*  MCV 94.2 94.6 92.9  --   PLT 163 185 163 151   Lab Results  Component Value Date   TSH 6.306 (H) 01/16/2015   Lab Results  Component Value Date   HGBA1C 8.3 08/27/2021   No results found for: "CHOL", "HDL", "LDLCALC", "LDLDIRECT", "TRIG", "CHOLHDL"  Significant Diagnostic Results in last 30 days:  No results found.  Assessment/Plan 1. Visit for wound check Wound without purulent drainage or expanding erythema. No pain associated with the wound. Continue calcium alginate and cover wound daily.    Family/ staff Communication: nurse  Labs/tests  ordered:  none

## 2022-02-22 LAB — HEMOGLOBIN A1C: Hemoglobin A1C: 8.1

## 2022-02-23 ENCOUNTER — Non-Acute Institutional Stay (SKILLED_NURSING_FACILITY): Payer: Medicare Other | Admitting: Nurse Practitioner

## 2022-02-23 ENCOUNTER — Encounter: Payer: Self-pay | Admitting: Nurse Practitioner

## 2022-02-23 DIAGNOSIS — E1159 Type 2 diabetes mellitus with other circulatory complications: Secondary | ICD-10-CM

## 2022-02-23 DIAGNOSIS — F01B0 Vascular dementia, moderate, without behavioral disturbance, psychotic disturbance, mood disturbance, and anxiety: Secondary | ICD-10-CM

## 2022-02-23 DIAGNOSIS — I5032 Chronic diastolic (congestive) heart failure: Secondary | ICD-10-CM

## 2022-02-23 DIAGNOSIS — N1831 Chronic kidney disease, stage 3a: Secondary | ICD-10-CM | POA: Diagnosis not present

## 2022-02-23 DIAGNOSIS — I10 Essential (primary) hypertension: Secondary | ICD-10-CM

## 2022-02-23 NOTE — Progress Notes (Signed)
Location:  Other Nursing Home Room Number: 709-G Place of Service:  SNF (31)  Dewayne Shorter, MD  Patient Care Team: Dewayne Shorter, MD as PCP - General [facility] Medicine)  Extended Emergency Contact Information Primary Emergency Contact: Lisabeth Pick Mobile Phone: [phone] Relation: Daughter  Goals of care: Advanced Directive information    02/23/2022    2:14 PM  Advanced Directives  Does Patient Have a Medical Advance Directive? Yes  Type of Advance Directive Out of facility DNR (pink MOST or yellow form)  Does patient want to make changes to medical advance directive? No - Patient declined  Pre-existing out of facility DNR order (yellow form or pink MOST form) Yellow form placed in chart (order not valid for inpatient use)     Chief Complaint  Patient presents with   Medical Management of Chronic Issues    Routine follow up visit.   Immunizations    Tetanus/Tdap vaccine, Pneumonia vaccine due   Quality Metric Gaps    Dexa scan,Medicare annual wellness visit and eye exam due    HPI:  Pt is a 88 y.o. female seen for routine follow up.  Pt continues to be followed by wound nurse for ongoing wound to right lower leg. No pain noted.   Pt with recent A1c of 8, blood sugars reviefwed and overall well controlled however staff reports family brings in candy so she has been eating more of that. No low blood sugars.   Mood stable- continues on zoloft 100 mg daily  CHF- without worsening edema or shortness of breath, continues on lasix 20 mg daily and potassium supplement  Pt with dementia- overall stable per nursing.   Past Medical History:  Diagnosis Date   AAA (abdominal aortic aneurysm) ([place])    Allergic state    Anemia    IRON DEF.ANEMIA   Arthritis    OSTEOARTHRITIS OF BIL.KNEES   B12 deficiency    Basal cell carcinoma    Callus of foot    bil.   Cancer ([place])    of CECUM   Cataract cortical, senile    RIGHT EYE   Cholesterol-lowering agent  myopathy 2004   Chronic diastolic heart failure (HCC)    Chronic kidney disease, stage 3a (HCC)    Diabetes mellitus without complication (HCC)    Diabetic neuropathy (HCC)    Diverticulosis    Drug-induced myopathy    Eczema    GERD (gastroesophageal reflux disease)    Gout    Hemorrhoids    History of chicken pox    History of hypercalcemia    Hypercholesterolemia    Hyperparathyroidism ([place])    Hypertension    Hypothyroidism    Incontinent of urine    Lumbago    Mycotic toenails    Neuropathy due to secondary diabetes mellitus ([place])    Wet senile macular degeneration ([place])    Past Surgical History:  Procedure Laterality Date   AMPUTATION TOE Left 07/09/2021   Procedure: AMPUTATION LEFT 2nd TOE;  Surgeon: Sharlotte Alamo, DPM;  Location: [facility] ORS;  Service: Podiatry;  Laterality: Left;   BUNIONECTOMY  2006   COLONOSCOPY     COLONOSCOPY WITH PROPOFOL N/A 12/09/2014   Procedure: COLONOSCOPY WITH PROPOFOL;  Surgeon: Lollie Sails, MD;  Location: [facility] ENDOSCOPY;  Service: Endoscopy;  Laterality: N/A;   COLONOSCOPY WITH PROPOFOL N/A 05/03/2017   Procedure: COLONOSCOPY WITH PROPOFOL;  Surgeon: Lollie Sails, MD;  Location: [facility] ENDOSCOPY;  Service: Endoscopy;  Laterality: N/A;   DEBRIDEMENT OF CALLUSES  Bilateral    FEET   DIAGNOSTIC LAPAROSCOPY     ESOPHAGOGASTRODUODENOSCOPY     EYE SURGERY     GOUT SURGERY     KNEE ARTHROSCOPY     LAPAROSCOPIC PARTIAL COLECTOMY Right 03/16/2010   LOWER EXTREMITY ANGIOGRAPHY Left 07/09/2021   Procedure: Lower Extremity Angiography;  Surgeon: Algernon Huxley, MD;  Location: [place] CV LAB;  Service: Cardiovascular;  Laterality: Left;   MOHS SURGERY  12/2008   MOUTH SURGERY     PARATHYROIDECTOMY  2001   TONSILLECTOMY      Allergies  Allergen Reactions   Simvastatin     Other reaction(s): Other (See Comments) Other Reaction: Myositis   Ace Inhibitors     Other reaction(s): Cough   Angiotensin Receptor Blockers Cough    Other  reaction(s): Cough   Lactose Other (See Comments)    intolerant   Losartan     Other reaction(s): Unknown   Penicillins Rash    Other reaction(s): Other (See Comments) Other Reaction: ALLERGY 3/31 - patient does not recall being allergic to PCN or amoxicillin     Outpatient Encounter Medications as of 02/23/2022  Medication Sig   acetaminophen (TYLENOL) 325 MG tablet Take 2 tablets (650 mg total) by mouth every 4 (four) hours as needed for mild pain (or Fever >/= 101).   carvedilol (COREG) 6.25 MG tablet TAKE ONE TABLET BY MOUTH TWICE DAILY   Cholecalciferol (VITAMIN D3) 1000 units CAPS Take 1 capsule by mouth daily.   Cyanocobalamin (VITAMIN B 12 PO) Take 1,000 mcg by mouth daily.   furosemide (LASIX) 20 MG tablet TAKE ONE TABLET BY MOUTH EVERY DAY   hydrocortisone 2.5 % cream Apply to groin area every 12 hours as needed.   Infant Care Products [facility]) OINT Apply to Left Buttock three times daily.   insulin degludec (TRESIBA FLEXTOUCH) 100 UNIT/ML FlexTouch Pen Inject 5 Units into the skin at bedtime.   Insulin Pen Needle 32G X 4 MM MISC 1 Dose by Does not apply route daily.   latanoprost (XALATAN) 0.005 % ophthalmic solution Place 1 drop into both eyes every evening.   metFORMIN (GLUCOPHAGE) 500 MG tablet TAKE ONE TABLET BY MOUTH TWICE DAILY   potassium chloride (KLOR-CON) 10 MEQ tablet TAKE ONE TABLET BY MOUTH TWICE DAILY   sertraline (ZOLOFT) 100 MG tablet Take 100 mg by mouth daily.   No facility-administered encounter medications on file as of 02/23/2022.    Review of Systems  Constitutional:  Negative for activity change, appetite change, fatigue and unexpected weight change.  HENT:  Negative for congestion and hearing loss.   Eyes: Negative.   Respiratory:  Negative for cough and shortness of breath.   Cardiovascular:  Negative for chest pain, palpitations and leg swelling.  Gastrointestinal:  Negative for abdominal pain, constipation and diarrhea.  Genitourinary:   Negative for difficulty urinating and dysuria.  Musculoskeletal:  Negative for arthralgias and myalgias.  Skin:  Negative for color change and wound.  Neurological:  Negative for dizziness and weakness.  Psychiatric/Behavioral:  Positive for confusion. Negative for agitation and behavioral problems.     Immunization History  Administered Date(s) Administered   Influenza Inj Mdck Quad Pf 01/20/2016   Influenza Split 02/04/2015   Influenza, High Dose Seasonal PF 01/11/2019   Influenza,inj,Quad PF,6+ Mos 02/10/2017   Influenza-Unspecified 01/02/2014, 01/26/2018, 01/24/2020, 01/26/2022   Moderna Sars-Covid-2 Vaccination 04/30/2019, 05/28/2019, 02/26/2020   PFIZER Comirnaty(Gray Top)Covid-19 Tri-Sucrose Vaccine 08/29/2020   Pneumococcal Polysaccharide-23 08/02/2014   Unspecified SARS-COV-2 Vaccination  09/08/2021, 02/19/2022   Zoster Recombinat (Shingrix) 12/10/2017, 03/13/2018   Pertinent  Health Maintenance Due  Topic Date Due   DEXA SCAN  Never done   OPHTHALMOLOGY EXAM  08/28/2021   HEMOGLOBIN A1C  08/23/2022   FOOT EXAM  01/01/2023   INFLUENZA VACCINE  Completed      07/08/2021    7:53 PM 07/09/2021    7:35 AM 07/09/2021    8:05 PM 07/10/2021    7:51 AM 07/10/2021   10:05 PM  Fall Risk  Patient Fall Risk Level High fall risk High fall risk High fall risk High fall risk High fall risk   Functional Status Survey:    Vitals:   02/23/22 1414  BP: 110/73  Pulse: 70  Resp: 16  Temp: (!) 96.4 F (35.8 C)  SpO2: 97%  Weight: 129 lb 3.2 oz (58.6 kg)  Height: 5' (1.524 m)   Body mass index is 25.23 kg/m. Physical Exam  Labs reviewed: Recent Labs    07/09/21 0722 07/09/21 1451 07/10/21 0818 07/11/21 0602 08/27/21 0000  NA 137  --  138 137 138  K 3.3*  --  3.3* 3.7 4.0  CL 104  --  106 109 102  CO2 22  --  22 21* 27*  GLUCOSE 297*  --  106* 176*  --   BUN 13   < > '10 12 14  '$ CREATININE 0.91   < > 0.77 0.70 0.8  CALCIUM 7.6*  --  7.8* 7.7* 8.5*  MG 1.6*  --  1.9  1.8  --    < > = values in this interval not displayed.   No results for input(s): "AST", "ALT", "ALKPHOS", "BILITOT", "PROT", "ALBUMIN" in the last 8760 hours. Recent Labs    07/09/21 0722 07/10/21 0818 07/11/21 0602 08/27/21 0000  WBC 10.2 13.0* 9.8 7.6  NEUTROABS 8.3* 10.6* 7.6 5,366.00  HGB 11.4* 11.7* 9.5* 11.2*  HCT 37.3 38.7 31.2* 35*  MCV 94.2 94.6 92.9  --   PLT 163 185 163 151   Lab Results  Component Value Date   TSH 6.306 (H) 01/16/2015   Lab Results  Component Value Date   HGBA1C 8.1 02/22/2022   No results found for: "CHOL", "HDL", "LDLCALC", "LDLDIRECT", "TRIG", "CHOLHDL"  Significant Diagnostic Results in last 30 days:  No results found.  Assessment/Plan 1. Chronic diastolic heart failure (HCC) -stable, continues on lasix and coreg -will follow up bmp  2. Primary hypertension -Blood pressure well controlled, goal bp <140/90 Continue current medications and dietary modifications follow metabolic panel  3. Chronic kidney disease, stage 3a (HCC) -Chronic and stable Encourage proper hydration Follow metabolic panel Avoid nephrotoxic meds (NSAIDS)  5. Type 2 diabetes mellitus with other circulatory complications (HCC) Elevated A1c at 8, goal <7.5 for age. She continues on glargine and metformin BID, overall fasting blood sguars <150. Will have staff talk to family about bringing in candy which she has been eating more recently.  Encouraged dietary compliance, routine foot care/monitoring and to keep up with diabetic eye exams through ophthalmology   6. Moderate vascular dementia without behavioral disturbance, psychotic disturbance, mood disturbance, or anxiety (HCC) -Stable, no acute changes in cognitive or functional status, continue supportive care.    Carlos American. Long, [place] [facility] [phone]

## 2022-05-12 ENCOUNTER — Non-Acute Institutional Stay (SKILLED_NURSING_FACILITY): Payer: Medicare Other | Admitting: Student

## 2022-05-12 ENCOUNTER — Encounter: Payer: Self-pay | Admitting: Student

## 2022-05-12 DIAGNOSIS — I5032 Chronic diastolic (congestive) heart failure: Secondary | ICD-10-CM

## 2022-05-12 DIAGNOSIS — N1831 Chronic kidney disease, stage 3a: Secondary | ICD-10-CM

## 2022-05-12 DIAGNOSIS — I701 Atherosclerosis of renal artery: Secondary | ICD-10-CM

## 2022-05-12 DIAGNOSIS — E1159 Type 2 diabetes mellitus with other circulatory complications: Secondary | ICD-10-CM

## 2022-05-12 DIAGNOSIS — D696 Thrombocytopenia, unspecified: Secondary | ICD-10-CM

## 2022-05-12 DIAGNOSIS — F01B0 Vascular dementia, moderate, without behavioral disturbance, psychotic disturbance, mood disturbance, and anxiety: Secondary | ICD-10-CM | POA: Insufficient documentation

## 2022-05-12 DIAGNOSIS — L97529 Non-pressure chronic ulcer of other part of left foot with unspecified severity: Secondary | ICD-10-CM

## 2022-05-12 DIAGNOSIS — I5033 Acute on chronic diastolic (congestive) heart failure: Secondary | ICD-10-CM

## 2022-05-12 DIAGNOSIS — C189 Malignant neoplasm of colon, unspecified: Secondary | ICD-10-CM | POA: Insufficient documentation

## 2022-05-12 DIAGNOSIS — H4089 Other specified glaucoma: Secondary | ICD-10-CM

## 2022-05-12 DIAGNOSIS — H353230 Exudative age-related macular degeneration, bilateral, stage unspecified: Secondary | ICD-10-CM | POA: Diagnosis not present

## 2022-05-12 NOTE — Progress Notes (Signed)
Location:  Other [place].  Nursing Home Room Number: [place]:  SNF [phone]) Provider:  Dewayne Shorter, MD  Patient Care Team: Dewayne Shorter, MD as PCP - General [facility] Medicine)  Extended Emergency Contact Information Primary Emergency Contact: Lisabeth Pick Mobile Phone: [phone] Relation: Daughter  Code Status:  DNR Goals of care: Advanced Directive information    05/12/2022   10:01 AM  Advanced Directives  Does Patient Have a Medical Advance Directive? Yes  Type of Advance Directive Out of facility DNR (pink MOST or yellow form)  Does patient want to make changes to medical advance directive? No - Patient declined     Chief Complaint  Patient presents with   Medical Management of Chronic Issues    Medical Management of Chronic Issues.     HPI:  Pt is a 89 y.o. female seen today for medical management of chronic diseases.    Patient is smiling and happy to invite me into her room. She states she isn't sure if she has had dinner or not. She is from [facility]. Unsure of the month or year. She thinks we could be in [place]. She is reading on her [place]. She likes anything she can find.   Nursing have no concerns. She is in a great mood today.    Past Medical History:  Diagnosis Date   AAA (abdominal aortic aneurysm) ([place])    Allergic state    Anemia    IRON DEF.ANEMIA   Arthritis    OSTEOARTHRITIS OF BIL.KNEES   B12 deficiency    Basal cell carcinoma    Callus of foot    bil.   Cancer ([place])    of CECUM   Cataract cortical, senile    RIGHT EYE   Cholesterol-lowering agent myopathy 2004   Chronic diastolic heart failure (HCC)    Chronic kidney disease, stage 3a (HCC)    Diabetes mellitus without complication (HCC)    Diabetic neuropathy (HCC)    Diverticulosis    Drug-induced myopathy    Eczema    GERD (gastroesophageal reflux disease)    Gout    Hemorrhoids    History of chicken pox    History of hypercalcemia     Hypercholesterolemia    Hyperparathyroidism ([place])    Hypertension    Hypothyroidism    Incontinent of urine    Lumbago    Mycotic toenails    Neuropathy due to secondary diabetes mellitus ([place])    Wet senile macular degeneration ([place])    Past Surgical History:  Procedure Laterality Date   AMPUTATION TOE Left 07/09/2021   Procedure: AMPUTATION LEFT 2nd TOE;  Surgeon: Sharlotte Alamo, DPM;  Location: [facility] ORS;  Service: Podiatry;  Laterality: Left;   BUNIONECTOMY  2006   COLONOSCOPY     COLONOSCOPY WITH PROPOFOL N/A 12/09/2014   Procedure: COLONOSCOPY WITH PROPOFOL;  Surgeon: Lollie Sails, MD;  Location: [facility] ENDOSCOPY;  Service: Endoscopy;  Laterality: N/A;   COLONOSCOPY WITH PROPOFOL N/A 05/03/2017   Procedure: COLONOSCOPY WITH PROPOFOL;  Surgeon: Lollie Sails, MD;  Location: [facility] ENDOSCOPY;  Service: Endoscopy;  Laterality: N/A;   DEBRIDEMENT OF CALLUSES Bilateral    FEET   DIAGNOSTIC LAPAROSCOPY     ESOPHAGOGASTRODUODENOSCOPY     EYE SURGERY     GOUT SURGERY     KNEE ARTHROSCOPY     LAPAROSCOPIC PARTIAL COLECTOMY Right 03/16/2010   LOWER EXTREMITY ANGIOGRAPHY Left 07/09/2021   Procedure: Lower Extremity Angiography;  Surgeon: Lucky Cowboy,  Erskine Squibb, MD;  Location: [place] CV LAB;  Service: Cardiovascular;  Laterality: Left;   MOHS SURGERY  12/2008   MOUTH SURGERY     PARATHYROIDECTOMY  2001   TONSILLECTOMY      Allergies  Allergen Reactions   Simvastatin     Other reaction(s): Other (See Comments) Other Reaction: Myositis   Ace Inhibitors     Other reaction(s): Cough   Angiotensin Receptor Blockers Cough    Other reaction(s): Cough   Lactose Other (See Comments)    intolerant   Losartan     Other reaction(s): Unknown   Penicillins Rash    Other reaction(s): Other (See Comments) Other Reaction: ALLERGY 3/31 - patient does not recall being allergic to PCN or amoxicillin     Outpatient Encounter Medications as of 05/12/2022  Medication Sig   acetaminophen  (TYLENOL) 325 MG tablet Take 2 tablets (650 mg total) by mouth every 4 (four) hours as needed for mild pain (or Fever >/= 101).   carvedilol (COREG) 6.25 MG tablet TAKE ONE TABLET BY MOUTH TWICE DAILY   Cholecalciferol (VITAMIN D3) 1000 units CAPS Take 1 capsule by mouth daily.   Cyanocobalamin (VITAMIN B 12 PO) Take 1,000 mcg by mouth daily.   furosemide (LASIX) 20 MG tablet TAKE ONE TABLET BY MOUTH EVERY DAY   Glucerna (GLUCERNA) LIQD Take 237 mLs by mouth daily.   hydrocortisone 2.5 % cream Apply to groin area every 12 hours as needed.   hydroxypropyl methylcellulose / hypromellose (ISOPTO TEARS / GONIOVISC) 2.5 % ophthalmic solution Place 1 drop into both eyes every 2 (two) hours as needed for dry eyes.   Infant Care Products [facility]) OINT Apply to Left Buttock three times daily.   insulin degludec (TRESIBA FLEXTOUCH) 100 UNIT/ML FlexTouch Pen Inject 5 Units into the skin at bedtime.   Insulin Pen Needle 32G X 4 MM MISC 1 Dose by Does not apply route daily.   latanoprost (XALATAN) 0.005 % ophthalmic solution Place 1 drop into both eyes every evening.   metFORMIN (GLUCOPHAGE) 500 MG tablet TAKE ONE TABLET BY MOUTH TWICE DAILY   potassium chloride (KLOR-CON) 10 MEQ tablet TAKE ONE TABLET BY MOUTH TWICE DAILY   sertraline (ZOLOFT) 100 MG tablet Take 100 mg by mouth daily.   No facility-administered encounter medications on file as of 05/12/2022.    Review of Systems  Immunization History  Administered Date(s) Administered   Influenza Inj Mdck Quad Pf 01/20/2016   Influenza Split 02/04/2015   Influenza, High Dose Seasonal PF 01/11/2019   Influenza,inj,Quad PF,6+ Mos 02/10/2017   Influenza-Unspecified 01/02/2014, 01/26/2018, 01/24/2020, 01/27/2021, 01/26/2022   Moderna Sars-Covid-2 Vaccination 04/30/2019, 05/28/2019, 02/26/2020   PFIZER Comirnaty(Gray Top)Covid-19 Tri-Sucrose Vaccine 08/29/2020   Pneumococcal Polysaccharide-23 08/02/2014   Unspecified SARS-COV-2 Vaccination  09/08/2021, 02/19/2022   Zoster Recombinat (Shingrix) 12/10/2017, 03/13/2018   Pertinent  Health Maintenance Due  Topic Date Due   DEXA SCAN  Never done   OPHTHALMOLOGY EXAM  08/28/2021   HEMOGLOBIN A1C  08/23/2022   FOOT EXAM  01/01/2023   INFLUENZA VACCINE  Completed      07/08/2021    7:53 PM 07/09/2021    7:35 AM 07/09/2021    8:05 PM 07/10/2021    7:51 AM 07/10/2021   10:05 PM  Fall Risk  (RETIRED) Patient Fall Risk Level High fall risk High fall risk High fall risk High fall risk High fall risk   Functional Status Survey:    Vitals:   05/12/22 0943  BP: Marland Kitchen)  171/88  Pulse: 87  Resp: 14  Temp: 98.2 F (36.8 C)  SpO2: 96%  Weight: 129 lb 9.6 oz (58.8 kg)  Height: 5' (1.524 m)   Body mass index is 25.31 kg/m. Physical Exam Constitutional:      Appearance: Normal appearance.  Cardiovascular:     Pulses: Normal pulses.  Pulmonary:     Effort: Pulmonary effort is normal.  Neurological:     Mental Status: She is alert. Mental status is at baseline. She is disoriented.  Psychiatric:        Mood and Affect: Mood normal.     Labs reviewed: Recent Labs    07/09/21 0722 07/09/21 1451 07/10/21 0818 07/11/21 0602 08/27/21 0000  NA 137  --  138 137 138  K 3.3*  --  3.3* 3.7 4.0  CL 104  --  106 109 102  CO2 22  --  22 21* 27*  GLUCOSE 297*  --  106* 176*  --   BUN 13   < > '10 12 14  '$ CREATININE 0.91   < > 0.77 0.70 0.8  CALCIUM 7.6*  --  7.8* 7.7* 8.5*  MG 1.6*  --  1.9 1.8  --    < > = values in this interval not displayed.   No results for input(s): "AST", "ALT", "ALKPHOS", "BILITOT", "PROT", "ALBUMIN" in the last 8760 hours. Recent Labs    07/09/21 0722 07/10/21 0818 07/11/21 0602 08/27/21 0000  WBC 10.2 13.0* 9.8 7.6  NEUTROABS 8.3* 10.6* 7.6 5,366.00  HGB 11.4* 11.7* 9.5* 11.2*  HCT 37.3 38.7 31.2* 35*  MCV 94.2 94.6 92.9  --   PLT 163 185 163 151   Lab Results  Component Value Date   TSH 6.306 (H) 01/16/2015   Lab Results  Component Value  Date   HGBA1C 8.1 02/22/2022   No results found for: "CHOL", "HDL", "LDLCALC", "LDLDIRECT", "TRIG", "CHOLHDL"  Significant Diagnostic Results in last 30 days:  No results found.  Assessment/Plan 1. Moderate vascular dementia without behavioral disturbance, psychotic disturbance, mood disturbance, or anxiety ([place]) Patient continues to have progression of memory changes with baseline disorientation. No disturbances at this time. Mood improving on current regimen. Continue sertraline 100 mg daily.   3. Bilateral exudative age-related macular degeneration, unspecified stage ([place])  Glaucoma Patient seen by ophthalmology regularly. Receives treatment. Continue latanoprost and artificial tears.   4. Non-pressure chronic ulcer of other part of left foot with unspecified severity ([place]) No evidence of ulcer at this time. CTM and offload to prevent future injury.   5. Acute on chronic diastolic CHF (congestive heart failure), NYHA class 3 ([place]) 6. Chronic diastolic heart failure ([place]) Patient appears euvolemic on exam. Rate controlled. Continue lasix daily. BMP ordered. Continue carvedilol 6.25 mg. Continue potassium supplementation.   7. Type 2 diabetes mellitus with other circulatory complications ([place]) Most recent A1c 8.1. Currently on metformin 500 mg BID. Will continue current dose. Consider increasing dose if recheck still >8. Continue glucerna. Continue Tresiba 5 units.   8. Atherosclerosis of renal artery (HCC) BP varies from normal to elevated. More normal values than elevated. Continue current regimen.  9. Thrombocytopenia ([place]) Most recent normal at 151. Will recheck with next set of labs.   10. Chronic kidney disease, stage 3a ([place]) Continue to monitor for progression. Minimize nephrotoxic medications.     Family/ staff Communication: nursing  Labs/tests ordered:  CBC, BMP, A1c with next round of labs.

## 2022-05-21 IMAGING — DX DG FOOT COMPLETE 3+V*L*
3 series · 3 of 3 positions shown · non-contrast
Comparison: None.

CLINICAL DATA: Evaluation for osteomyelitis of the second and third
left toes.

EXAM:
LEFT FOOT - COMPLETE 3+ VIEW

[foot ap]
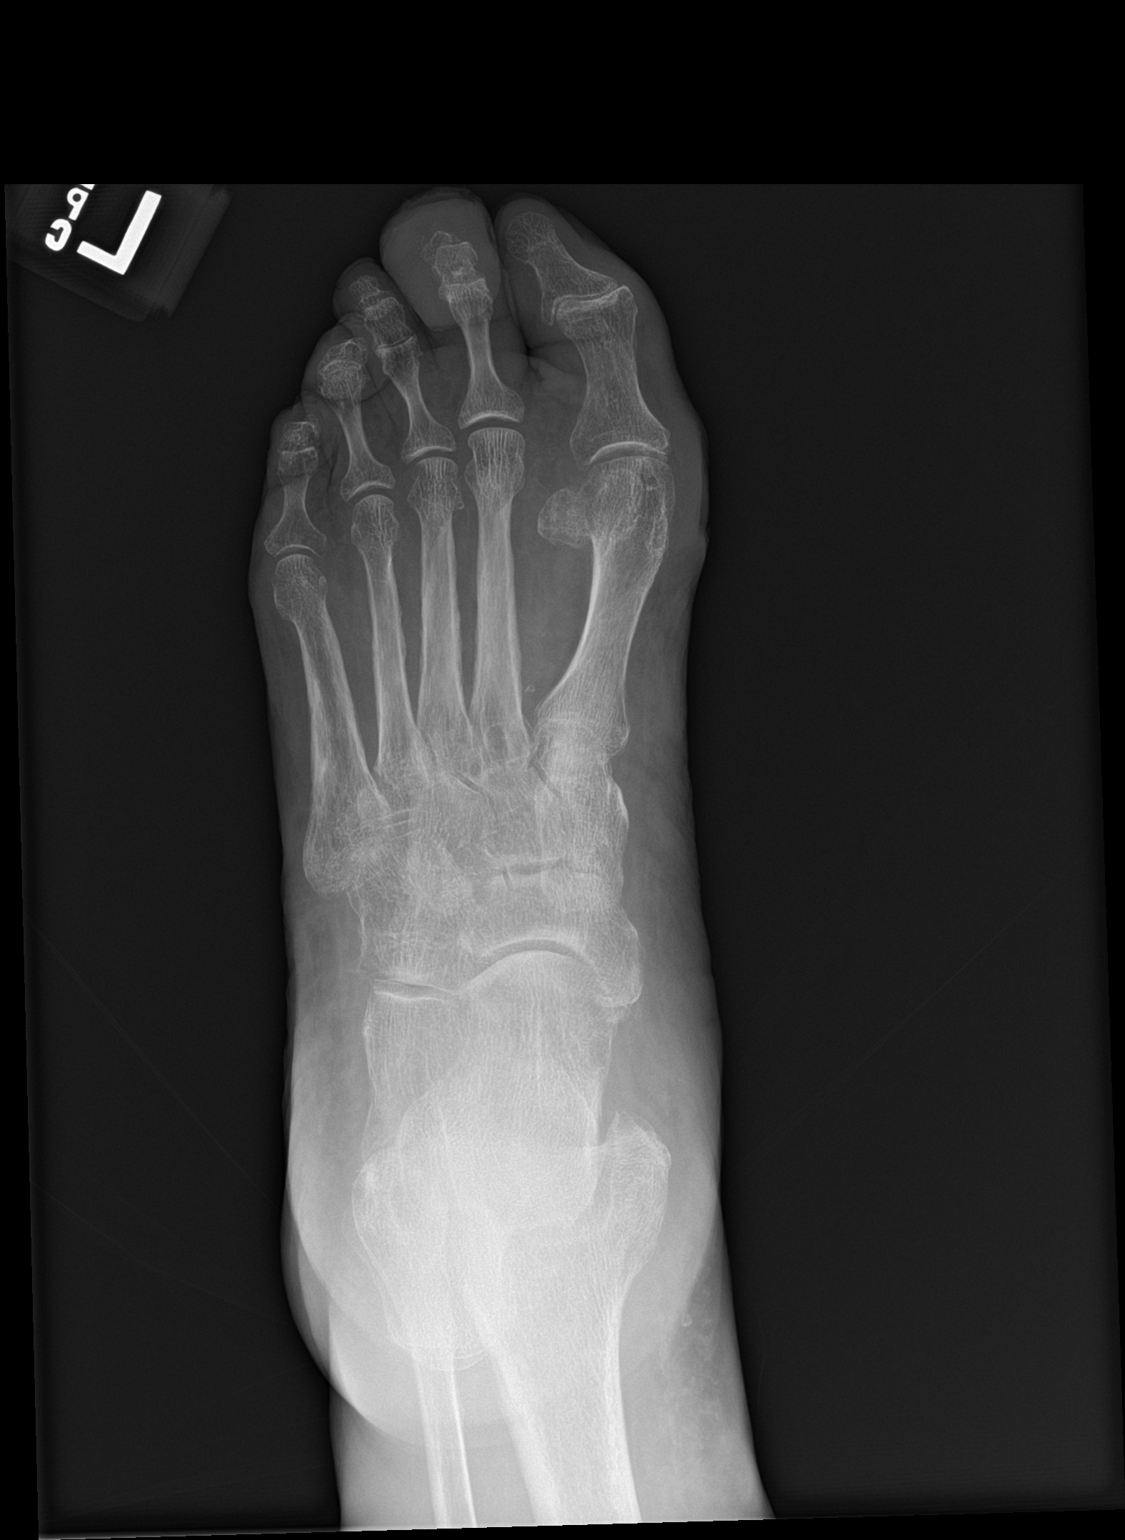

[foot obl]
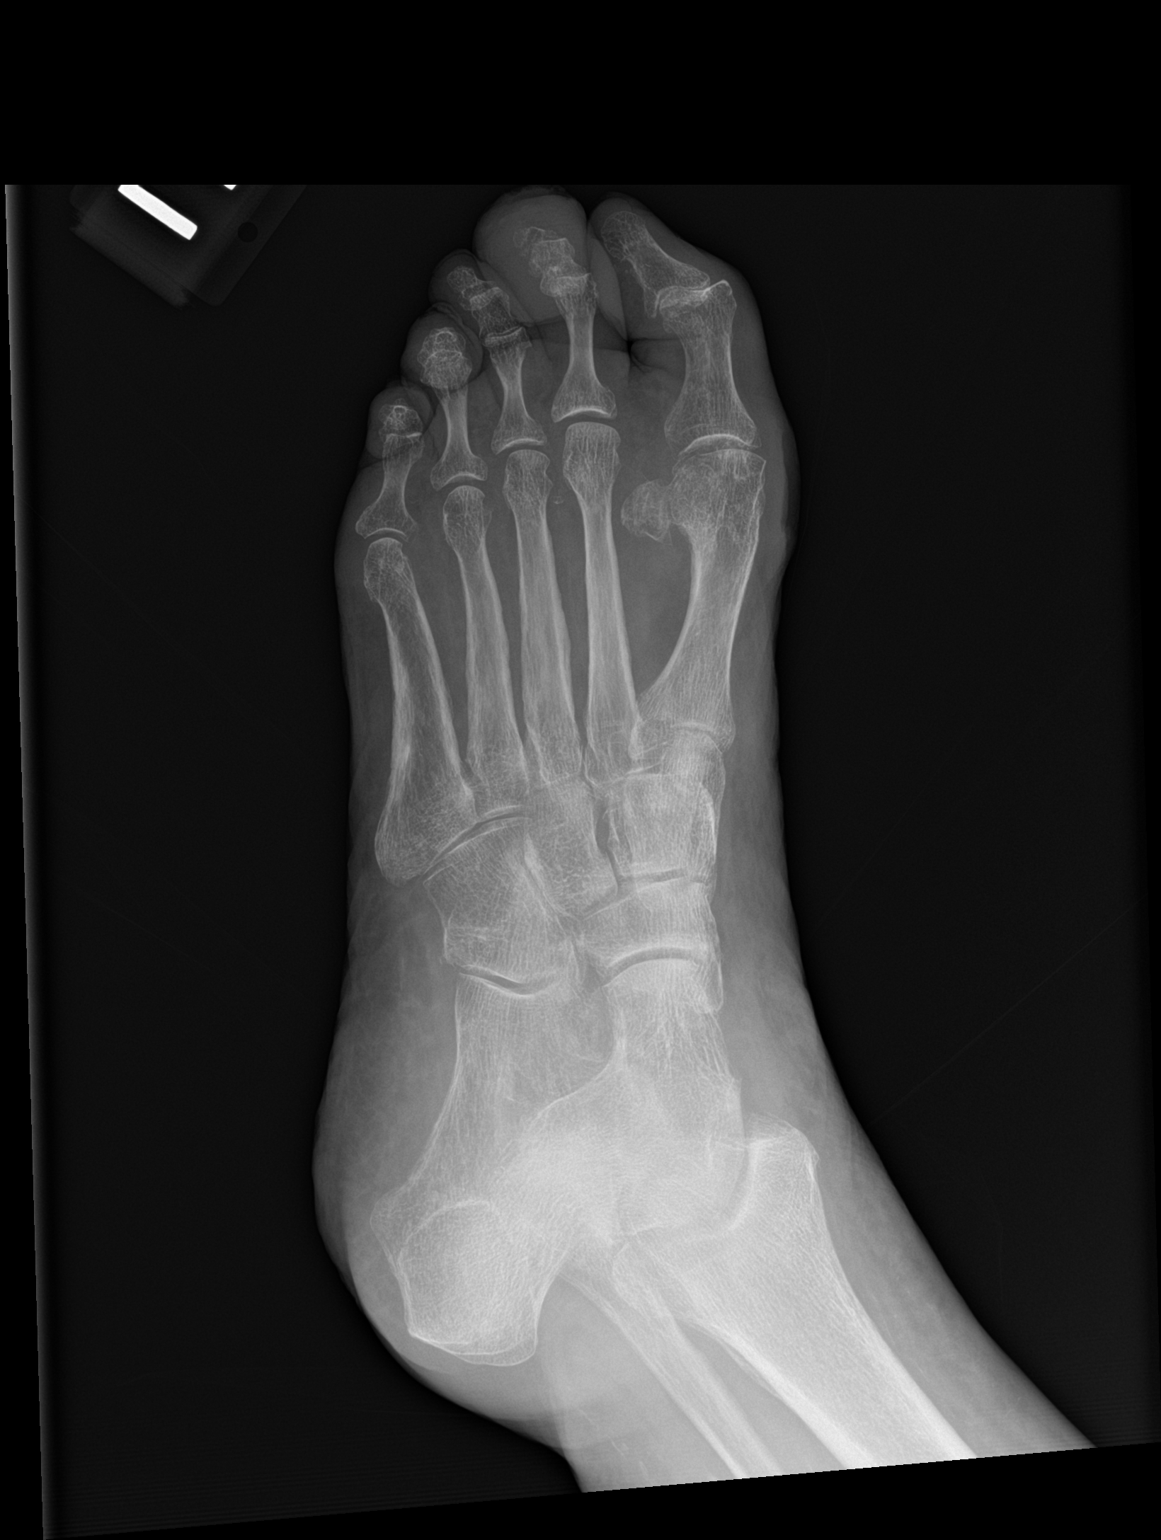

[foot lat]
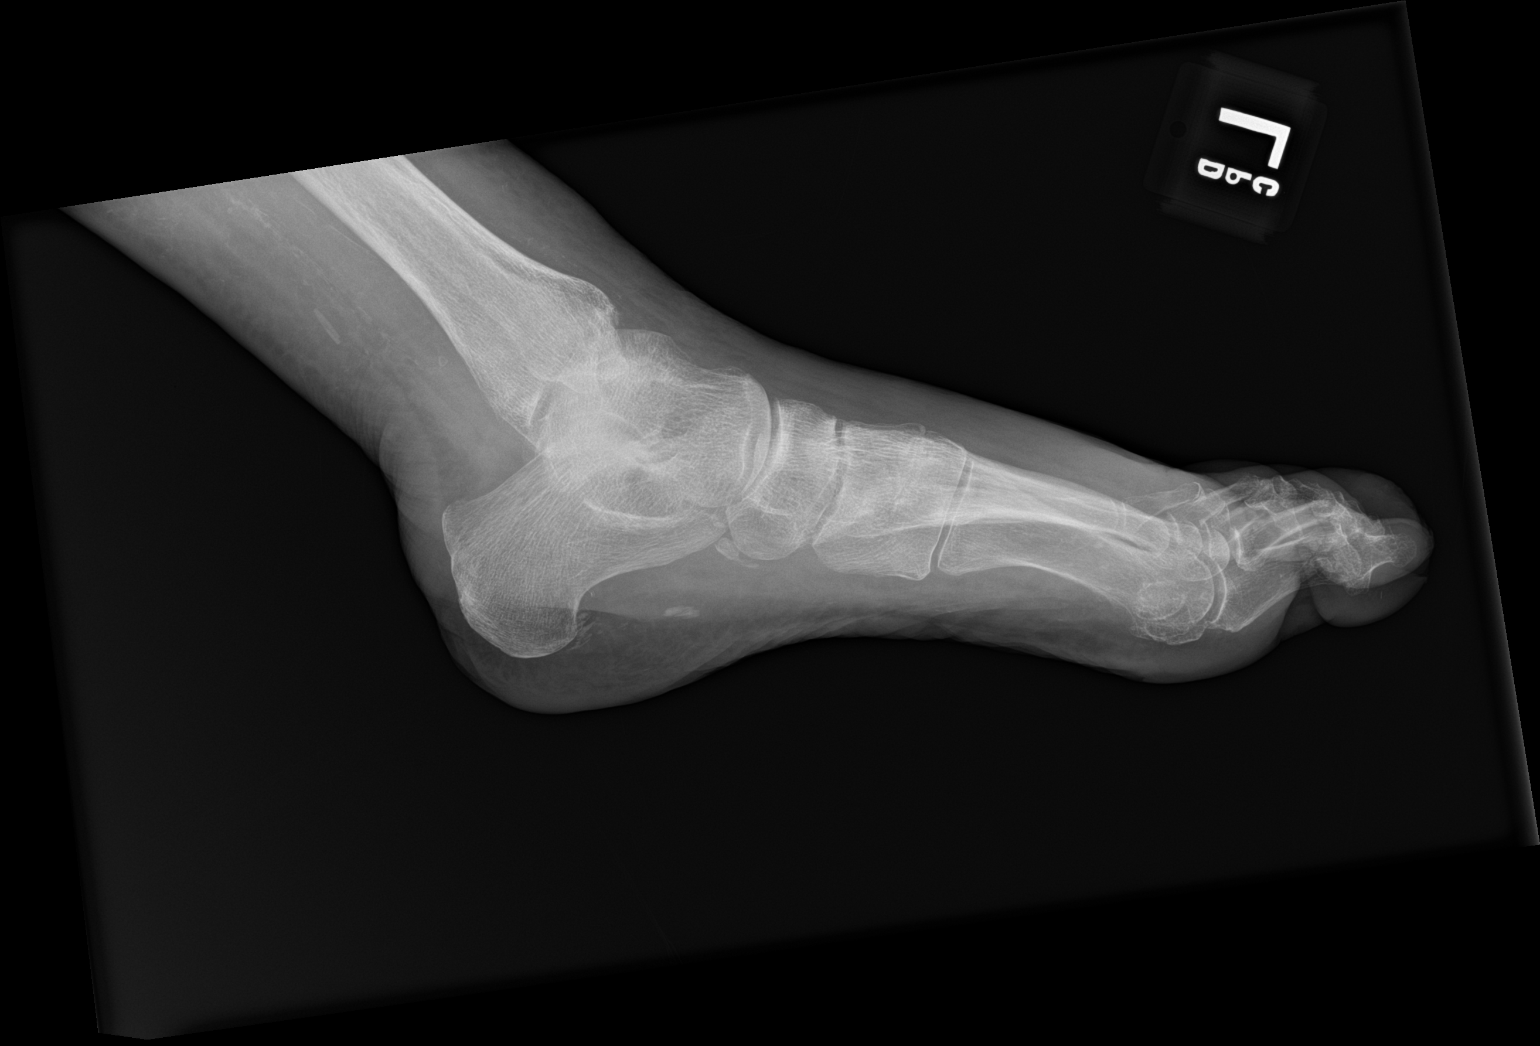

[3 of 3 positions shown; findings below may reference images not displayed]

FINDINGS: There is no evidence of an fracture or dislocation. Cortical
destruction is seen along the distal phalanx of the second left toe.
Marked severity diffuse soft tissue swelling of the second left toe
is also noted.
IMPRESSION: Findings suspicious for acute osteomyelitis involving the distal
phalanx of the second left toe.

## 2022-06-17 ENCOUNTER — Non-Acute Institutional Stay (SKILLED_NURSING_FACILITY): Payer: Medicare Other | Admitting: Nurse Practitioner

## 2022-06-17 ENCOUNTER — Encounter: Payer: Self-pay | Admitting: Nurse Practitioner

## 2022-06-17 DIAGNOSIS — F01B0 Vascular dementia, moderate, without behavioral disturbance, psychotic disturbance, mood disturbance, and anxiety: Secondary | ICD-10-CM

## 2022-06-17 DIAGNOSIS — I5032 Chronic diastolic (congestive) heart failure: Secondary | ICD-10-CM

## 2022-06-17 DIAGNOSIS — N1831 Chronic kidney disease, stage 3a: Secondary | ICD-10-CM | POA: Diagnosis not present

## 2022-06-17 DIAGNOSIS — E1159 Type 2 diabetes mellitus with other circulatory complications: Secondary | ICD-10-CM

## 2022-06-17 DIAGNOSIS — F411 Generalized anxiety disorder: Secondary | ICD-10-CM

## 2022-06-17 DIAGNOSIS — D631 Anemia in chronic kidney disease: Secondary | ICD-10-CM

## 2022-06-17 DIAGNOSIS — K219 Gastro-esophageal reflux disease without esophagitis: Secondary | ICD-10-CM

## 2022-06-17 DIAGNOSIS — I10 Essential (primary) hypertension: Secondary | ICD-10-CM | POA: Diagnosis not present

## 2022-06-17 DIAGNOSIS — I2510 Atherosclerotic heart disease of native coronary artery without angina pectoris: Secondary | ICD-10-CM

## 2022-06-17 NOTE — Progress Notes (Signed)
Location:  Other [facility]) [place] Number: [id number] A Place of Service:  SNF (31)  Joshuajames Moehring K. Dewaine Oats, NP   Patient Care Team: Dewayne Shorter, MD as PCP - General [facility] Medicine)  Extended Emergency Contact Information Primary Emergency Contact: Lisabeth Pick Mobile Phone: [phone] Relation: Daughter  Goals of care: Advanced Directive information    06/17/2022   12:25 PM  Advanced Directives  Does Patient Have a Medical Advance Directive? Yes  Type of Advance Directive Out of facility DNR (pink MOST or yellow form)  Does patient want to make changes to medical advance directive? No - Patient declined     Chief Complaint  Patient presents with   Medical Management of Chronic Issues    Routine visit. Discuss need for td/tdap or post pone if patient refuses or is not a candidate.     HPI:  Pt is a 89 y.o. female seen today for medical management of chronic disease. Pt is a long term skilled resident at [facility]   DM- noncompliant with dietary modifications but blood sugars are doing well.  Will need follow up A1c Denies any low blood sugars.   Anxiety- reports this is doing well. No increase in anxiety or depression.   States she does not sleep well- nursing reports she has a hard time getting up in the morning.  Reports a lot of napping during the day   Eats well, weight has been stable.   No pain. Bowels and bladder moving well.    Past Medical History:  Diagnosis Date   AAA (abdominal aortic aneurysm) ([place])    Allergic state    Anemia    IRON DEF.ANEMIA   Arthritis    OSTEOARTHRITIS OF BIL.KNEES   B12 deficiency    Basal cell carcinoma    Callus of foot    bil.   Cancer ([place])    of CECUM   Cataract cortical, senile    RIGHT EYE   Cholesterol-lowering agent myopathy 2004   Chronic diastolic heart failure (HCC)    Chronic kidney disease, stage 3a (HCC)    Diabetes mellitus without complication (HCC)    Diabetic neuropathy (HCC)     Diverticulosis    Drug-induced myopathy    Eczema    GERD (gastroesophageal reflux disease)    Gout    Hemorrhoids    History of chicken pox    History of hypercalcemia    Hypercholesterolemia    Hyperparathyroidism ([place])    Hypertension    Hypothyroidism    Incontinent of urine    Lumbago    Mycotic toenails    Neuropathy due to secondary diabetes mellitus ([place])    Wet senile macular degeneration ([place])    Past Surgical History:  Procedure Laterality Date   AMPUTATION TOE Left 07/09/2021   Procedure: AMPUTATION LEFT 2nd TOE;  Surgeon: Sharlotte Alamo, DPM;  Location: [facility] ORS;  Service: Podiatry;  Laterality: Left;   BUNIONECTOMY  2006   COLONOSCOPY     COLONOSCOPY WITH PROPOFOL N/A 12/09/2014   Procedure: COLONOSCOPY WITH PROPOFOL;  Surgeon: Lollie Sails, MD;  Location: [facility] ENDOSCOPY;  Service: Endoscopy;  Laterality: N/A;   COLONOSCOPY WITH PROPOFOL N/A 05/03/2017   Procedure: COLONOSCOPY WITH PROPOFOL;  Surgeon: Lollie Sails, MD;  Location: [facility] ENDOSCOPY;  Service: Endoscopy;  Laterality: N/A;   DEBRIDEMENT OF CALLUSES Bilateral    FEET   DIAGNOSTIC LAPAROSCOPY     ESOPHAGOGASTRODUODENOSCOPY     EYE SURGERY     GOUT  SURGERY     KNEE ARTHROSCOPY     LAPAROSCOPIC PARTIAL COLECTOMY Right 03/16/2010   LOWER EXTREMITY ANGIOGRAPHY Left 07/09/2021   Procedure: Lower Extremity Angiography;  Surgeon: Algernon Huxley, MD;  Location: [place] CV LAB;  Service: Cardiovascular;  Laterality: Left;   MOHS SURGERY  12/2008   MOUTH SURGERY     PARATHYROIDECTOMY  2001   TONSILLECTOMY      Allergies  Allergen Reactions   Simvastatin     Other reaction(s): Other (See Comments) Other Reaction: Myositis   Ace Inhibitors     Other reaction(s): Cough   Angiotensin Receptor Blockers Cough    Other reaction(s): Cough   Lactose Other (See Comments)    intolerant   Losartan     Other reaction(s): Unknown   Penicillins Rash    Other reaction(s): Other (See Comments) Other  Reaction: ALLERGY 3/31 - patient does not recall being allergic to PCN or amoxicillin     Outpatient Encounter Medications as of 06/17/2022  Medication Sig   acetaminophen (TYLENOL) 325 MG tablet Take 2 tablets (650 mg total) by mouth every 4 (four) hours as needed for mild pain (or Fever >/= 101).   carvedilol (COREG) 6.25 MG tablet TAKE ONE TABLET BY MOUTH TWICE DAILY   Cholecalciferol (VITAMIN D3) 1000 units CAPS Take 1 capsule by mouth daily.   Cyanocobalamin (VITAMIN B 12 PO) Take 1,000 mcg by mouth daily.   furosemide (LASIX) 20 MG tablet TAKE ONE TABLET BY MOUTH EVERY DAY   hydrocortisone 2.5 % cream Apply to groin area every 12 hours as needed.   hydroxypropyl methylcellulose / hypromellose (ISOPTO TEARS / GONIOVISC) 2.5 % ophthalmic solution Place 1 drop into both eyes every 2 (two) hours as needed for dry eyes.   Infant Care Products [facility]) OINT Apply to Left Buttock three times daily.   insulin degludec (TRESIBA FLEXTOUCH) 100 UNIT/ML FlexTouch Pen Inject 5 Units into the skin at bedtime.   Insulin Pen Needle 32G X 4 MM MISC 1 Dose by Does not apply route daily.   latanoprost (XALATAN) 0.005 % ophthalmic solution Place 1 drop into both eyes every evening.   metFORMIN (GLUCOPHAGE) 500 MG tablet TAKE ONE TABLET BY MOUTH TWICE DAILY   potassium chloride (KLOR-CON) 10 MEQ tablet TAKE ONE TABLET BY MOUTH TWICE DAILY   sertraline (ZOLOFT) 100 MG tablet Take 100 mg by mouth daily.   [DISCONTINUED] Glucerna (GLUCERNA) LIQD Take 237 mLs by mouth daily.   No facility-administered encounter medications on file as of 06/17/2022.    Review of Systems  Constitutional:  Negative for activity change, appetite change, fatigue and unexpected weight change.  HENT:  Negative for congestion and hearing loss.   Eyes: Negative.   Respiratory:  Negative for cough and shortness of breath.   Cardiovascular:  Negative for chest pain, palpitations and leg swelling.  Gastrointestinal:  Negative for  abdominal pain, constipation and diarrhea.  Genitourinary:  Negative for difficulty urinating and dysuria.  Musculoskeletal:  Negative for arthralgias and myalgias.  Skin:  Negative for color change and wound.  Neurological:  Negative for dizziness and weakness.  Psychiatric/Behavioral:  Positive for confusion. Negative for agitation and behavioral problems.      Immunization History  Administered Date(s) Administered   Influenza Inj Mdck Quad Pf 01/20/2016   Influenza Split 02/04/2015   Influenza, High Dose Seasonal PF 01/11/2019   Influenza,inj,Quad PF,6+ Mos 02/10/2017   Influenza-Unspecified 01/02/2014, 01/26/2018, 01/24/2020, 01/27/2021, 01/26/2022   Moderna Sars-Covid-2 Vaccination 04/30/2019, 05/28/2019, 02/26/2020  PFIZER Comirnaty(Gray Top)Covid-19 Tri-Sucrose Vaccine 08/29/2020   Pneumococcal Polysaccharide-23 08/02/2014   Unspecified SARS-COV-2 Vaccination 09/08/2021, 02/19/2022   Zoster Recombinat (Shingrix) 12/10/2017, 03/13/2018   Pertinent  Health Maintenance Due  Topic Date Due   DEXA SCAN  04/13/2023 (Originally 11/14/1998)   HEMOGLOBIN A1C  08/23/2022   FOOT EXAM  01/01/2023   OPHTHALMOLOGY EXAM  04/29/2023   INFLUENZA VACCINE  Completed      07/08/2021    7:53 PM 07/09/2021    7:35 AM 07/09/2021    8:05 PM 07/10/2021    7:51 AM 07/10/2021   10:05 PM  Fall Risk  (RETIRED) Patient Fall Risk Level High fall risk High fall risk High fall risk High fall risk High fall risk   Functional Status Survey:    Vitals:   06/17/22 1224  BP: 117/70  Pulse: 67  Weight: 127 lb 6.4 oz (57.8 kg)  Height: 5' (1.524 m)   Body mass index is 24.88 kg/m. Physical Exam Constitutional:      General: She is not in acute distress.    Appearance: She is well-developed. She is not diaphoretic.  HENT:     Head: Normocephalic and atraumatic.     Mouth/Throat:     Pharynx: No oropharyngeal exudate.  Eyes:     Conjunctiva/sclera: Conjunctivae normal.     Pupils: Pupils are  equal, round, and reactive to light.  Cardiovascular:     Rate and Rhythm: Normal rate and regular rhythm.     Heart sounds: Normal heart sounds.  Pulmonary:     Effort: Pulmonary effort is normal.     Breath sounds: Normal breath sounds.  Abdominal:     General: Bowel sounds are normal.     Palpations: Abdomen is soft.  Musculoskeletal:     Cervical back: Normal range of motion and neck supple.     Right lower leg: No edema.     Left lower leg: No edema.  Skin:    General: Skin is warm and dry.  Neurological:     Mental Status: She is alert.  Psychiatric:        Mood and Affect: Mood normal.     Labs reviewed: Recent Labs    07/09/21 0722 07/09/21 1451 07/10/21 0818 07/11/21 0602 08/27/21 0000  NA 137  --  138 137 138  K 3.3*  --  3.3* 3.7 4.0  CL 104  --  106 109 102  CO2 22  --  22 21* 27*  GLUCOSE 297*  --  106* 176*  --   BUN 13   < > '10 12 14  '$ CREATININE 0.91   < > 0.77 0.70 0.8  CALCIUM 7.6*  --  7.8* 7.7* 8.5*  MG 1.6*  --  1.9 1.8  --    < > = values in this interval not displayed.   No results for input(s): "AST", "ALT", "ALKPHOS", "BILITOT", "PROT", "ALBUMIN" in the last 8760 hours. Recent Labs    07/09/21 0722 07/10/21 0818 07/11/21 0602 08/27/21 0000  WBC 10.2 13.0* 9.8 7.6  NEUTROABS 8.3* 10.6* 7.6 5,366.00  HGB 11.4* 11.7* 9.5* 11.2*  HCT 37.3 38.7 31.2* 35*  MCV 94.2 94.6 92.9  --   PLT 163 185 163 151   Lab Results  Component Value Date   TSH 6.306 (H) 01/16/2015   Lab Results  Component Value Date   HGBA1C 8.1 02/22/2022   No results found for: "CHOL", "HDL", "LDLCALC", "LDLDIRECT", "TRIG", "CHOLHDL"  Significant Diagnostic Results in  last 30 days:  No results found.  Assessment/Plan 1. Chronic diastolic heart failure (HCC) -eulvocemic, continues on coreg and lasix, continue medication with low sodium diet  2. Type 2 diabetes mellitus with other circulatory complications (HCC) -Encouraged dietary compliance, routine foot  care/monitoring and to keep up with diabetic eye exams through ophthalmology  -continues on metformin and tresiba   3. Chronic kidney disease, stage 3a (HCC) -Chronic and stable Encourage proper hydration Follow metabolic panel Avoid nephrotoxic meds (NSAIDS)  4. Primary hypertension -Blood pressure well controlled, goal bp <140/90 Continue current medications and dietary modifications follow metabolic panel  5. Moderate vascular dementia without behavioral disturbance, psychotic disturbance, mood disturbance, or anxiety (HCC) -Stable, no acute changes in cognitive or functional status, continue supportive care with staff and family   6. Gastroesophageal reflux disease without esophagitis Stable at this time, not currently on medications.   7. Anemia due to stage 3a chronic kidney disease ([place]) Will follow up cbc  8. Coronary artery disease involving native coronary artery of native heart without angina pectoris -no chest pains at this time, continues on coreg BID   9. Generalized anxiety disorder -well controlled on zoloft. Continue current dose.   Carlos American. Ragan, [facility] [phone]

## 2022-06-21 LAB — BASIC METABOLIC PANEL
BUN: 25 — AB (ref 4–21)
CO2: 25 — AB (ref 13–22)
Chloride: 103 (ref 99–108)
Creatinine: 0.9 (ref 0.5–1.1)
Glucose: 122
Potassium: 3.9 mEq/L (ref 3.5–5.1)
Sodium: 139 (ref 137–147)

## 2022-06-21 LAB — CBC AND DIFFERENTIAL
HCT: 31 — AB (ref 36–46)
Hemoglobin: 10 — AB (ref 12.0–16.0)
Platelets: 119 10*3/uL — AB (ref 150–400)
WBC: 6.3

## 2022-06-21 LAB — HEPATIC FUNCTION PANEL
ALT: 10 U/L (ref 7–35)
AST: 29 (ref 13–35)
Alkaline Phosphatase: 70 (ref 25–125)
Bilirubin, Total: 0.7

## 2022-06-21 LAB — COMPREHENSIVE METABOLIC PANEL
Albumin: 3.4 — AB (ref 3.5–5.0)
Calcium: 7.9 — AB (ref 8.7–10.7)
Globulin: 2.9
eGFR: 66

## 2022-06-21 LAB — HEMOGLOBIN A1C: Hemoglobin A1C: 7.6

## 2022-06-21 LAB — CBC: RBC: 3.61 — AB (ref 3.87–5.11)

## 2022-06-21 LAB — HM DIABETES EYE EXAM

## 2022-07-27 ENCOUNTER — Non-Acute Institutional Stay (SKILLED_NURSING_FACILITY): Payer: Medicare Other | Admitting: Nurse Practitioner

## 2022-07-27 ENCOUNTER — Encounter: Payer: Self-pay | Admitting: Nurse Practitioner

## 2022-07-27 DIAGNOSIS — L89623 Pressure ulcer of left heel, stage 3: Secondary | ICD-10-CM | POA: Diagnosis not present

## 2022-07-27 NOTE — Progress Notes (Signed)
Location:  Other [facility].  Nursing Home Room Number: [facility] 522A Place of Service:  SNF [phone]) Andrea Chatters, NP  PCP: Earnestine Mealing, MD  Patient Care Team: Earnestine Mealing, MD as PCP - General [facility] Medicine)  Extended Emergency Contact Information Primary Emergency Contact: Andrea Lawson Mobile Phone: [phone] Relation: Daughter  Goals of care: Advanced Directive information    07/27/2022    1:07 PM  Advanced Directives  Does Patient Have a Medical Advance Directive? Yes  Type of Advance Directive Out of facility DNR (pink MOST or yellow form)  Does patient want to make changes to medical advance directive? No - Patient declined     Chief Complaint  Patient presents with   Acute Visit    Left Heel Wound    HPI:  Pt is a 89 y.o. female seen today for an acute visit for Left Heel Wound.  Wound has been followed with nursing and recently opened back up with odorous discharge. Now applying santyl and with pressure reduction.    Past Medical History:  Diagnosis Date   AAA (abdominal aortic aneurysm)    Allergic state    Anemia    IRON DEF.ANEMIA   Arthritis    OSTEOARTHRITIS OF BIL.KNEES   B12 deficiency    Basal cell carcinoma    Callus of foot    bil.   Cancer    of CECUM   Cataract cortical, senile    RIGHT EYE   Cholesterol-lowering agent myopathy 2004   Chronic diastolic heart failure    Chronic kidney disease, stage 3a    Diabetes mellitus without complication    Diabetic neuropathy    Diverticulosis    Drug-induced myopathy    Eczema    GERD (gastroesophageal reflux disease)    Gout    Hemorrhoids    History of chicken pox    History of hypercalcemia    Hypercholesterolemia    Hyperparathyroidism    Hypertension    Hypothyroidism    Incontinent of urine    Lumbago    Mycotic toenails    Neuropathy due to secondary diabetes mellitus    Wet senile macular degeneration    Past Surgical History:  Procedure Laterality Date    AMPUTATION TOE Left 07/09/2021   Procedure: AMPUTATION LEFT 2nd TOE;  Surgeon: Linus Galas, DPM;  Location: [facility] ORS;  Service: Podiatry;  Laterality: Left;   BUNIONECTOMY  2006   COLONOSCOPY     COLONOSCOPY WITH PROPOFOL N/A 12/09/2014   Procedure: COLONOSCOPY WITH PROPOFOL;  Surgeon: Christena Deem, MD;  Location: [facility] ENDOSCOPY;  Service: Endoscopy;  Laterality: N/A;   COLONOSCOPY WITH PROPOFOL N/A 05/03/2017   Procedure: COLONOSCOPY WITH PROPOFOL;  Surgeon: Christena Deem, MD;  Location: [facility] ENDOSCOPY;  Service: Endoscopy;  Laterality: N/A;   DEBRIDEMENT OF CALLUSES Bilateral    FEET   DIAGNOSTIC LAPAROSCOPY     ESOPHAGOGASTRODUODENOSCOPY     EYE SURGERY     GOUT SURGERY     KNEE ARTHROSCOPY     LAPAROSCOPIC PARTIAL COLECTOMY Right 03/16/2010   LOWER EXTREMITY ANGIOGRAPHY Left 07/09/2021   Procedure: Lower Extremity Angiography;  Surgeon: Annice Needy, MD;  Location: [facility] INVASIVE CV LAB;  Service: Cardiovascular;  Laterality: Left;   MOHS SURGERY  12/2008   MOUTH SURGERY     PARATHYROIDECTOMY  2001   TONSILLECTOMY      Allergies  Allergen Reactions   Simvastatin     Other reaction(s): Other (See Comments) Other Reaction: Myositis  Ace Inhibitors     Other reaction(s): Cough   Angiotensin Receptor Blockers Cough    Other reaction(s): Cough   Lactose Other (See Comments)    intolerant   Losartan     Other reaction(s): Unknown   Penicillins Rash    Other reaction(s): Other (See Comments) Other Reaction: ALLERGY 3/31 - patient does not recall being allergic to PCN or amoxicillin     Outpatient Encounter Medications as of 07/27/2022  Medication Sig   acetaminophen (TYLENOL) 325 MG tablet Take 2 tablets (650 mg total) by mouth every 4 (four) hours as needed for mild pain (or Fever >/= 101).   carvedilol (COREG) 6.25 MG tablet TAKE ONE TABLET BY MOUTH TWICE DAILY   Cholecalciferol (VITAMIN D3) 1000 units CAPS Take 1 capsule by mouth daily.   collagenase (SANTYL)  250 UNIT/GM ointment Apply to Left Heel topically in the morning for pressure injury   Cyanocobalamin (VITAMIN B 12 PO) Take 1,000 mcg by mouth daily.   furosemide (LASIX) 20 MG tablet TAKE ONE TABLET BY MOUTH EVERY DAY   hydroxypropyl methylcellulose / hypromellose (ISOPTO TEARS / GONIOVISC) 2.5 % ophthalmic solution Place 1 drop into both eyes every 2 (two) hours as needed for dry eyes.   Infant Care Products [facility]) OINT Apply to Left Buttock three times daily.   insulin degludec (TRESIBA FLEXTOUCH) 100 UNIT/ML FlexTouch Pen Inject 5 Units into the skin at bedtime.   Insulin Pen Needle 32G X 4 MM MISC 1 Dose by Does not apply route daily.   latanoprost (XALATAN) 0.005 % ophthalmic solution Place 1 drop into both eyes every evening.   metFORMIN (GLUCOPHAGE) 500 MG tablet TAKE ONE TABLET BY MOUTH TWICE DAILY   Multiple Vitamins-Minerals (PRESERVISION AREDS 2 PO) Give one capsule by mouth twice daily.   potassium chloride (KLOR-CON) 10 MEQ tablet TAKE ONE TABLET BY MOUTH TWICE DAILY   sertraline (ZOLOFT) 100 MG tablet Take 100 mg by mouth daily.   [DISCONTINUED] hydrocortisone 2.5 % cream Apply to groin area every 12 hours as needed.   No facility-administered encounter medications on file as of 07/27/2022.    Review of Systems  Constitutional:  Negative for activity change, appetite change, chills, diaphoresis, fatigue and fever.  Skin:  Positive for color change and wound.    Immunization History  Administered Date(s) Administered   Influenza Inj Mdck Quad Pf 01/20/2016   Influenza Split 02/04/2015   Influenza, High Dose Seasonal PF 01/11/2019   Influenza,inj,Quad PF,6+ Mos 02/10/2017   Influenza-Unspecified 01/02/2014, 01/26/2018, 01/24/2020, 01/27/2021, 01/26/2022   Moderna Sars-Covid-2 Vaccination 04/30/2019, 05/28/2019, 02/26/2020   PFIZER Comirnaty(Gray Top)Covid-19 Tri-Sucrose Vaccine 08/29/2020   Pneumococcal Polysaccharide-23 08/02/2014   Unspecified SARS-COV-2  Vaccination 09/08/2021, 02/19/2022   Zoster Recombinat (Shingrix) 12/10/2017, 03/13/2018   Pertinent  Health Maintenance Due  Topic Date Due   DEXA SCAN  04/13/2023 (Originally 11/14/1998)   INFLUENZA VACCINE  11/11/2022   HEMOGLOBIN A1C  12/22/2022   FOOT EXAM  01/01/2023   OPHTHALMOLOGY EXAM  06/21/2023      07/08/2021    7:53 PM 07/09/2021    7:35 AM 07/09/2021    8:05 PM 07/10/2021    7:51 AM 07/10/2021   10:05 PM  Fall Risk  (RETIRED) Patient Fall Risk Level High fall risk High fall risk High fall risk High fall risk High fall risk   Functional Status Survey:    Vitals:   07/27/22 1252  BP: 112/71  Pulse: 78  Resp: 18  Temp: 98.4 F (36.9 C)  SpO2: 98%  Weight: 125 lb (56.7 kg)  Height: 5' (1.524 m)   Body mass index is 24.41 kg/m. Physical Exam Constitutional:      Appearance: Normal appearance.  Pulmonary:     Effort: Pulmonary effort is normal.  Neurological:     Mental Status: She is alert. Mental status is at baseline.  Psychiatric:        Mood and Affect: Mood normal.    Labs reviewed: Recent Labs    08/27/21 0000 06/21/22 0000  NA 138 139  K 4.0 3.9  CL 102 103  CO2 27* 25*  BUN 14 25*  CREATININE 0.8 0.9  CALCIUM 8.5* 7.9*   Recent Labs    06/21/22 0000  AST 29  ALT 10  ALKPHOS 70  ALBUMIN 3.4*   Recent Labs    08/27/21 0000 06/21/22 0000  WBC 7.6 6.3  NEUTROABS 5,366.00  --   HGB 11.2* 10.0*  HCT 35* 31*  PLT 151 119*   Lab Results  Component Value Date   TSH 6.306 (H) 01/16/2015   Lab Results  Component Value Date   HGBA1C 7.6 06/21/2022   No results found for: "CHOL", "HDL", "LDLCALC", "LDLDIRECT", "TRIG", "CHOLHDL"  Significant Diagnostic Results in last 30 days:  No results found.  Assessment/Plan 1. Pressure injury of left heel, stage 3 -continue wound care with santyl daily -pressure reduction with heel support -wound care consult in place -will culture wound due to odor and start doxycycline 100 mg BID for  7 days   Szymon Foiles K. Biagio Borg [facility] [phone]

## 2022-08-02 LAB — HEMOGLOBIN A1C: Hemoglobin A1C: 7.3

## 2022-09-01 ENCOUNTER — Encounter: Payer: Self-pay | Admitting: Student

## 2022-09-01 ENCOUNTER — Non-Acute Institutional Stay (SKILLED_NURSING_FACILITY): Payer: Medicare Other | Admitting: Student

## 2022-09-01 DIAGNOSIS — I10 Essential (primary) hypertension: Secondary | ICD-10-CM

## 2022-09-01 DIAGNOSIS — I5032 Chronic diastolic (congestive) heart failure: Secondary | ICD-10-CM

## 2022-09-01 DIAGNOSIS — F01B0 Vascular dementia, moderate, without behavioral disturbance, psychotic disturbance, mood disturbance, and anxiety: Secondary | ICD-10-CM | POA: Diagnosis not present

## 2022-09-01 DIAGNOSIS — R634 Abnormal weight loss: Secondary | ICD-10-CM

## 2022-09-01 DIAGNOSIS — N1831 Chronic kidney disease, stage 3a: Secondary | ICD-10-CM

## 2022-09-01 DIAGNOSIS — L89623 Pressure ulcer of left heel, stage 3: Secondary | ICD-10-CM | POA: Diagnosis not present

## 2022-09-01 DIAGNOSIS — E1159 Type 2 diabetes mellitus with other circulatory complications: Secondary | ICD-10-CM

## 2022-09-01 NOTE — Progress Notes (Signed)
Location:  Other [facility] [place].  Nursing Home Room Number: [facility] 522A Place of Service:  SNF [phone]) Provider:  Earnestine Mealing, MD  Patient Care Team: Earnestine Mealing, MD as PCP - General [facility] Medicine)  Extended Emergency Contact Information Primary Emergency Contact: [name] Mobile Phone: [phone] Relation: Daughter  Code Status:  DNR Goals of care: Advanced Directive information    09/01/2022    9:09 AM  Advanced Directives  Does Patient Have a Medical Advance Directive? Yes  Type of Advance Directive Out of facility DNR (pink MOST or yellow form)  Does patient want to make changes to medical advance directive? No - Patient declined     Chief Complaint  Patient presents with   Medical Management of Chronic Issues    Medical Management of Chronic Issues.     HPI:  Pt is a 89 y.o. female seen today for medical management of chronic diseases.    Patient is alert and Oriented to self.   When asked her name, she says "give me a minute I'm thinking." Says Andrea Lawson. Her birthday she says [birth year] every year, but doesn't give her birth year. She is aware that she is in a bed in her room, but unable to give location.   Patient denies pain, shortness of breath, or chest pain.   She continues to have issues with wound.   Endorses pain with lifting her legs  She doesn't do activities because "she isn't invited."   Per nursing she stays in her room for meals and most activiits.   Past Medical History:  Diagnosis Date   AAA (abdominal aortic aneurysm) (HCC)    Allergic state    Anemia    IRON DEF.ANEMIA   Arthritis    OSTEOARTHRITIS OF BIL.KNEES   B12 deficiency    Basal cell carcinoma    Callus of foot    bil.   Cancer (HCC)    of CECUM   Cataract cortical, senile    RIGHT EYE   Cholesterol-lowering agent myopathy 2004   Chronic diastolic heart failure (HCC)    Chronic kidney disease, stage 3a (HCC)    Diabetes mellitus without  complication (HCC)    Diabetic neuropathy (HCC)    Diverticulosis    Drug-induced myopathy    Eczema    GERD (gastroesophageal reflux disease)    Gout    Hemorrhoids    History of chicken pox    History of hypercalcemia    Hypercholesterolemia    Hyperparathyroidism (HCC)    Hypertension    Hypothyroidism    Incontinent of urine    Lumbago    Mycotic toenails    Neuropathy due to secondary diabetes mellitus (HCC)    Wet senile macular degeneration (HCC)    Past Surgical History:  Procedure Laterality Date   AMPUTATION TOE Left 07/09/2021   Procedure: AMPUTATION LEFT 2nd TOE;  Surgeon: Linus Galas, DPM;  Location: [facility] ORS;  Service: Podiatry;  Laterality: Left;   BUNIONECTOMY  2006   COLONOSCOPY     COLONOSCOPY WITH PROPOFOL N/A 12/09/2014   Procedure: COLONOSCOPY WITH PROPOFOL;  Surgeon: Christena Deem, MD;  Location: [facility] ENDOSCOPY;  Service: Endoscopy;  Laterality: N/A;   COLONOSCOPY WITH PROPOFOL N/A 05/03/2017   Procedure: COLONOSCOPY WITH PROPOFOL;  Surgeon: Christena Deem, MD;  Location: [facility] ENDOSCOPY;  Service: Endoscopy;  Laterality: N/A;   DEBRIDEMENT OF CALLUSES Bilateral    FEET   DIAGNOSTIC LAPAROSCOPY     ESOPHAGOGASTRODUODENOSCOPY  EYE SURGERY     GOUT SURGERY     KNEE ARTHROSCOPY     LAPAROSCOPIC PARTIAL COLECTOMY Right 03/16/2010   LOWER EXTREMITY ANGIOGRAPHY Left 07/09/2021   Procedure: Lower Extremity Angiography;  Surgeon: Annice Needy, MD;  Location: [facility] INVASIVE CV LAB;  Service: Cardiovascular;  Laterality: Left;   MOHS SURGERY  12/2008   MOUTH SURGERY     PARATHYROIDECTOMY  2001   TONSILLECTOMY      Allergies  Allergen Reactions   Simvastatin     Other reaction(s): Other (See Comments) Other Reaction: Myositis   Ace Inhibitors     Other reaction(s): Cough   Angiotensin Receptor Blockers Cough    Other reaction(s): Cough   Lactose Other (See Comments)    intolerant   Losartan     Other reaction(s): Unknown   Penicillins Rash     Other reaction(s): Other (See Comments) Other Reaction: ALLERGY 3/31 - patient does not recall being allergic to PCN or amoxicillin     Outpatient Encounter Medications as of 09/01/2022  Medication Sig   acetaminophen (TYLENOL) 325 MG tablet Take 2 tablets (650 mg total) by mouth every 4 (four) hours as needed for mild pain (or Fever >/= 101).   carvedilol (COREG) 6.25 MG tablet TAKE ONE TABLET BY MOUTH TWICE DAILY   Cholecalciferol (VITAMIN D3) 1000 units CAPS Take 1 capsule by mouth daily.   Cyanocobalamin (VITAMIN B 12 PO) Take 1,000 mcg by mouth daily.   furosemide (LASIX) 20 MG tablet TAKE ONE TABLET BY MOUTH EVERY DAY   hydrocortisone 2.5 % cream Apply to groin area topically every 12 hours as needed for excoriation.   hydroxypropyl methylcellulose / hypromellose (ISOPTO TEARS / GONIOVISC) 2.5 % ophthalmic solution Place 1 drop into both eyes every 2 (two) hours as needed for dry eyes.   Infant Care Products [facility]) OINT Apply to Left Buttock three times daily.   insulin degludec (TRESIBA FLEXTOUCH) 100 UNIT/ML FlexTouch Pen Inject 5 Units into the skin at bedtime.   Insulin Pen Needle 32G X 4 MM MISC 1 Dose by Does not apply route daily.   Lactase (LACTAID PO) Give one by mouth every shift. Only give with Ensure or dairy products.   latanoprost (XALATAN) 0.005 % ophthalmic solution Place 1 drop into both eyes every evening.   metFORMIN (GLUCOPHAGE) 500 MG tablet TAKE ONE TABLET BY MOUTH TWICE DAILY   Multiple Vitamins-Minerals (PRESERVISION AREDS 2 PO) Give one capsule by mouth twice daily.   potassium chloride (KLOR-CON) 10 MEQ tablet TAKE ONE TABLET BY MOUTH TWICE DAILY   sertraline (ZOLOFT) 100 MG tablet Take 100 mg by mouth daily.   Sodium Hypochlorite (ANASEPT ANTIMICROBIAL) 0.057 % GEL Apply to Left heel wound topically every shift for wound care after cleaning wound, apply Anasept gel to wound bed. Then cover with telfa and wrap with kerlix daily.   Sodium Hypochlorite 0.5  % SOLN Apply to Left heel wound topically every shift for wound care Clean wound with dakin's solution prior to dressing change.   [DISCONTINUED] collagenase (SANTYL) 250 UNIT/GM ointment Apply to Left Heel topically in the morning for pressure injury   No facility-administered encounter medications on file as of 09/01/2022.    Review of Systems  Immunization History  Administered Date(s) Administered   Influenza Inj Mdck Quad Pf 01/20/2016   Influenza Split 02/04/2015   Influenza, High Dose Seasonal PF 01/11/2019   Influenza,inj,Quad PF,6+ Mos 02/10/2017   Influenza-Unspecified 01/02/2014, 01/26/2018, 01/24/2020, 01/27/2021, 01/26/2022   Moderna  Sars-Covid-2 Vaccination 04/30/2019, 05/28/2019, 02/26/2020   PFIZER Comirnaty(Gray Top)Covid-19 Tri-Sucrose Vaccine 08/29/2020   Pneumococcal Polysaccharide-23 08/02/2014   Unspecified SARS-COV-2 Vaccination 09/08/2021, 02/19/2022   Zoster Recombinat (Shingrix) 12/10/2017, 03/13/2018   Pertinent  Health Maintenance Due  Topic Date Due   DEXA SCAN  04/13/2023 (Originally 11/14/1998)   INFLUENZA VACCINE  11/11/2022   HEMOGLOBIN A1C  02/01/2023   OPHTHALMOLOGY EXAM  06/21/2023   FOOT EXAM  08/17/2023      07/08/2021    7:53 PM 07/09/2021    7:35 AM 07/09/2021    8:05 PM 07/10/2021    7:51 AM 07/10/2021   10:05 PM  Fall Risk  (RETIRED) Patient Fall Risk Level High fall risk High fall risk High fall risk High fall risk High fall risk   Functional Status Survey:    Vitals:   09/01/22 0852  BP: 104/67  Pulse: 76  Resp: 18  Temp: 98 F (36.7 C)  SpO2: 91%  Weight: 123 lb (55.8 kg)  Height: 5' (1.524 m)   Body mass index is 24.02 kg/m. Physical Exam  Labs reviewed: Recent Labs    06/21/22 0000  NA 139  K 3.9  CL 103  CO2 25*  BUN 25*  CREATININE 0.9  CALCIUM 7.9*   Recent Labs    06/21/22 0000  AST 29  ALT 10  ALKPHOS 70  ALBUMIN 3.4*   Recent Labs    06/21/22 0000  WBC 6.3  HGB 10.0*  HCT 31*  PLT 119*    Lab Results  Component Value Date   TSH 6.306 (H) 01/16/2015   Lab Results  Component Value Date   HGBA1C 7.3 08/02/2022   No results found for: "CHOL", "HDL", "LDLCALC", "LDLDIRECT", "TRIG", "CHOLHDL"  Significant Diagnostic Results in last 30 days:  No results found.  Assessment/Plan 1. Moderate vascular dementia without behavioral disturbance, psychotic disturbance, mood disturbance, or anxiety (HCC) Patient has had weight loss in the last few months. She is disoriented and increasingly dependent. Wound on heel is difficult to heal as patient is intermittently compliant to offloading requests. Continue Zoloft. Consider adding mirtazapine to medications to help with weight management. Continue GOC conversations with family.   2. Pressure injury of left heel, stage 3 (HCC) Continue to co-manage with wound physician. Continue dakins packing daily. Reflective of poor nutrition.   3. Chronic diastolic heart failure (HCC) Appears euvolemic on exam. Continue to monitor with daily weights. Continue lasix 20 mg daily and coreg 6.25 BID. Continue potassium supplementation.   4. Type 2 diabetes mellitus with other circulatory complications (HCC) Most recent A1c well-controlled at 7.3. Goal of less than 8 given patient's age and risk of hypoglycemia. Continue   5. Primary hypertension BP on the low side with two medications. Increases fall risk when persistently <120/70. Will continue to encourage hydration as her medications are indicated for management of CHF.   6. Chronic kidney disease, stage 3a (HCC) Stable, avoid nephrotoxic medications.     Family/ staff Communication: nursing  Labs/tests ordered:  none

## 2022-09-13 ENCOUNTER — Telehealth: Payer: Self-pay | Admitting: Student

## 2022-09-13 NOTE — Telephone Encounter (Signed)
Patient with more confusion. Discussed with nursing importance of hydration. Will plan for CBC, CMP, TSH and A1c. If white count is elevated can consider collecting a urine -- at this time patient has stable vital signs. Sending to NP for in person evaluation on 6/4.

## 2022-09-14 NOTE — Telephone Encounter (Signed)
Discussed with nurse today, apparently at baseline and plans to get labs next lab day. They will notify for changes.

## 2022-09-16 LAB — BASIC METABOLIC PANEL
BUN: 26 — AB (ref 4–21)
CO2: 25 — AB (ref 13–22)
Chloride: 100 (ref 99–108)
Creatinine: 0.8 (ref 0.5–1.1)
Glucose: 153
Potassium: 4.5 mEq/L (ref 3.5–5.1)
Sodium: 135 — AB (ref 137–147)

## 2022-09-16 LAB — HEPATIC FUNCTION PANEL
ALT: 17 U/L (ref 7–35)
AST: 30 (ref 13–35)
Alkaline Phosphatase: 91 (ref 25–125)
Bilirubin, Total: 0.6

## 2022-09-16 LAB — CBC AND DIFFERENTIAL
HCT: 32 — AB (ref 36–46)
Hemoglobin: 10.1 — AB (ref 12.0–16.0)
Neutrophils Absolute: 8469
Platelets: 224 10*3/uL (ref 150–400)
WBC: 10.9

## 2022-09-16 LAB — COMPREHENSIVE METABOLIC PANEL
Albumin: 2.8 — AB (ref 3.5–5.0)
Calcium: 8.3 — AB (ref 8.7–10.7)
Globulin: 3.2
eGFR: 71

## 2022-09-16 LAB — CBC: RBC: 3.67 — AB (ref 3.87–5.11)

## 2022-09-16 LAB — TSH: TSH: 4.66 (ref 0.41–5.90)

## 2022-09-16 LAB — HEMOGLOBIN A1C: Hemoglobin A1C: 7.9

## 2022-10-03 ENCOUNTER — Telehealth: Payer: Self-pay | Admitting: Orthopedic Surgery

## 2022-10-03 ENCOUNTER — Telehealth: Payer: Medicare Other | Admitting: Orthopedic Surgery

## 2022-10-03 DIAGNOSIS — Z515 Encounter for palliative care: Secondary | ICD-10-CM

## 2022-10-03 MED ORDER — MORPHINE SULFATE (CONCENTRATE) 20 MG/ML PO SOLN
5.0000 mg | ORAL | 0 refills | Status: DC | PRN
Start: 2022-10-03 — End: 2022-12-17

## 2022-10-03 NOTE — Telephone Encounter (Signed)
[facility] nurse, [place], reports BP 60's, RR 30's, temp 95. Unable to get pulse ox or HR at this time. Patient is nonverbal and moaning. Discussed change in condition with daughter, Orlie Pollen. Plan of care comfort based at this time. Orders for Morphine 20mg /77mL> 0.25 mg po q4 hrs prn given.

## 2022-10-03 NOTE — Telephone Encounter (Signed)
[facility] nurse, Wallene Huh, called to report patient is not eating or drinking fluids x 2 days. H/o dementia. Recent vitals: 86/48, 97.3, HR 44, RR 18, 95%. Discussed treatment options with daughter, Orlie Pollen. She does not want hospitalization or IV fluids at this time. She would like to discuss MOST form/ Hospice tomorrow with physician. Advised nursing to Hold coreg, metformin and Tresiba until evaluated by provider tomorrow. Advised to encourage oral hydration.

## 2022-10-04 ENCOUNTER — Encounter: Payer: Self-pay | Admitting: Student

## 2022-10-04 ENCOUNTER — Non-Acute Institutional Stay (SKILLED_NURSING_FACILITY): Payer: Medicare Other | Admitting: Student

## 2022-10-04 DIAGNOSIS — Z515 Encounter for palliative care: Secondary | ICD-10-CM

## 2022-10-04 DIAGNOSIS — L89623 Pressure ulcer of left heel, stage 3: Secondary | ICD-10-CM

## 2022-10-04 DIAGNOSIS — E1159 Type 2 diabetes mellitus with other circulatory complications: Secondary | ICD-10-CM

## 2022-10-04 DIAGNOSIS — F01B0 Vascular dementia, moderate, without behavioral disturbance, psychotic disturbance, mood disturbance, and anxiety: Secondary | ICD-10-CM | POA: Diagnosis not present

## 2022-10-04 DIAGNOSIS — I5032 Chronic diastolic (congestive) heart failure: Secondary | ICD-10-CM

## 2022-10-04 DIAGNOSIS — E43 Unspecified severe protein-calorie malnutrition: Secondary | ICD-10-CM

## 2022-10-04 DIAGNOSIS — N1831 Chronic kidney disease, stage 3a: Secondary | ICD-10-CM

## 2022-10-05 DIAGNOSIS — E43 Unspecified severe protein-calorie malnutrition: Secondary | ICD-10-CM | POA: Insufficient documentation

## 2022-10-05 DIAGNOSIS — Z515 Encounter for palliative care: Secondary | ICD-10-CM | POA: Insufficient documentation

## 2022-10-05 DIAGNOSIS — L89623 Pressure ulcer of left heel, stage 3: Secondary | ICD-10-CM | POA: Insufficient documentation

## 2022-10-11 NOTE — Progress Notes (Unsigned)
Location:  Other Nursing Home Room Number: [facility] [address] Place of Service:  SNF [phone])  Provider: Sydnee Cabal  PCP: Earnestine Mealing, MD Patient Care Team: Earnestine Mealing, MD as PCP - General (Family Medicine)  Extended Emergency Contact Information Primary Emergency Contact: [name] Mobile Phone: [phone] Relation: Daughter  Code Status: DNR Goals of care:  Advanced Directive information    09/30/2022    9:34 AM  Advanced Directives  Does Patient Have a Medical Advance Directive? Yes  Type of Advance Directive Out of facility DNR (pink MOST or yellow form)  Does patient want to make changes to medical advance directive? No - Patient declined     Allergies  Allergen Reactions   Simvastatin     Other reaction(s): Other (See Comments) Other Reaction: Myositis   Ace Inhibitors     Other reaction(s): Cough   Angiotensin Receptor Blockers Cough    Other reaction(s): Cough   Lactose Other (See Comments)    intolerant   Losartan     Other reaction(s): Unknown   Penicillins Rash    Other reaction(s): Other (See Comments) Other Reaction: ALLERGY 3/31 - patient does not recall being allergic to PCN or amoxicillin     Chief Complaint  Patient presents with   Medical Management of Chronic Issues    Medical Management of Chronic Issues.     HPI:  89 y.o. female  who was admitted 07/2021 for progression of dementia. Patient has had notable decline including poor PO intake and non-healing wound present. Daughter is at bedside wondering when she should call family. She knows her mother was "ready to go and has been gone a long time."   Past Medical History:  Diagnosis Date   AAA (abdominal aortic aneurysm) (HCC)    Allergic state    Anemia    IRON DEF.ANEMIA   Arthritis    OSTEOARTHRITIS OF BIL.KNEES   B12 deficiency    Basal cell carcinoma    Callus of foot    bil.   Cancer (HCC)    of CECUM   Cataract cortical, senile    RIGHT EYE    Cholesterol-lowering agent myopathy 2004   Chronic diastolic heart failure (HCC)    Chronic kidney disease, stage 3a (HCC)    Diabetes mellitus without complication (HCC)    Diabetic neuropathy (HCC)    Diverticulosis    Drug-induced myopathy    Eczema    GERD (gastroesophageal reflux disease)    Gout    Hemorrhoids    History of chicken pox    History of hypercalcemia    Hypercholesterolemia    Hyperparathyroidism (HCC)    Hypertension    Hypothyroidism    Incontinent of urine    Lumbago    Mycotic toenails    Neuropathy due to secondary diabetes mellitus (HCC)    Wet senile macular degeneration (HCC)     Past Surgical History:  Procedure Laterality Date   AMPUTATION TOE Left 07/09/2021   Procedure: AMPUTATION LEFT 2nd TOE;  Surgeon: Linus Galas, DPM;  Location: [facility] ORS;  Service: Podiatry;  Laterality: Left;   BUNIONECTOMY  2006   COLONOSCOPY     COLONOSCOPY WITH PROPOFOL N/A 12/09/2014   Procedure: COLONOSCOPY WITH PROPOFOL;  Surgeon: Christena Deem, MD;  Location: [facility] ENDOSCOPY;  Service: Endoscopy;  Laterality: N/A;   COLONOSCOPY WITH PROPOFOL N/A 05/03/2017   Procedure: COLONOSCOPY WITH PROPOFOL;  Surgeon: Christena Deem, MD;  Location: [facility] ENDOSCOPY;  Service: Endoscopy;  Laterality: N/A;  DEBRIDEMENT OF CALLUSES Bilateral    FEET   DIAGNOSTIC LAPAROSCOPY     ESOPHAGOGASTRODUODENOSCOPY     EYE SURGERY     GOUT SURGERY     KNEE ARTHROSCOPY     LAPAROSCOPIC PARTIAL COLECTOMY Right 03/16/2010   LOWER EXTREMITY ANGIOGRAPHY Left 07/09/2021   Procedure: Lower Extremity Angiography;  Surgeon: Annice Needy, MD;  Location: [facility] INVASIVE CV LAB;  Service: Cardiovascular;  Laterality: Left;   MOHS SURGERY  12/2008   MOUTH SURGERY     PARATHYROIDECTOMY  2001   TONSILLECTOMY        reports that she has quit smoking. She has never used smokeless tobacco. She reports current alcohol use. She reports that she does not use drugs. Social History   Socioeconomic  History   Marital status: Married    Spouse name: Not on file   Number of children: Not on file   Years of education: Not on file   Highest education level: Not on file  Occupational History   Not on file  Tobacco Use   Smoking status: Former   Smokeless tobacco: Never  Vaping Use   Vaping Use: Never used  Substance and Sexual Activity   Alcohol use: Yes    Comment: WINE   Drug use: No   Sexual activity: Not on file  Other Topics Concern   Not on file  Social History Narrative   Not on file   Social Determinants of Health   Financial Resource Strain: Not on file  Food Insecurity: Not on file  Transportation Needs: Not on file  Physical Activity: Not on file  Stress: Not on file  Social Connections: Not on file  Intimate Partner Violence: Not on file   Functional Status Survey:    Allergies  Allergen Reactions   Simvastatin     Other reaction(s): Other (See Comments) Other Reaction: Myositis   Ace Inhibitors     Other reaction(s): Cough   Angiotensin Receptor Blockers Cough    Other reaction(s): Cough   Lactose Other (See Comments)    intolerant   Losartan     Other reaction(s): Unknown   Penicillins Rash    Other reaction(s): Other (See Comments) Other Reaction: ALLERGY 3/31 - patient does not recall being allergic to PCN or amoxicillin     Pertinent  Health Maintenance Due  Topic Date Due   DEXA SCAN  04/13/2023 (Originally 11/14/1998)   INFLUENZA VACCINE  11/11/2022   HEMOGLOBIN A1C  03/18/2023   OPHTHALMOLOGY EXAM  06/21/2023   FOOT EXAM  08/17/2023    Medications: Outpatient Encounter Medications as of 09/19/2022  Medication Sig   acetaminophen (TYLENOL) 325 MG tablet Take 2 tablets (650 mg total) by mouth every 4 (four) hours as needed for mild pain (or Fever >/= 101).   carvedilol (COREG) 6.25 MG tablet TAKE ONE TABLET BY MOUTH TWICE DAILY   Cholecalciferol (VITAMIN D3) 1000 units CAPS Take 1 capsule by mouth daily.   Cyanocobalamin (VITAMIN B  12 PO) Take 1,000 mcg by mouth daily.   doxycycline (ADOXA) 100 MG tablet Take 100 mg by mouth 2 (two) times daily.   furosemide (LASIX) 20 MG tablet TAKE ONE TABLET BY MOUTH EVERY DAY   hydrocortisone 2.5 % cream Apply to groin area topically every 12 hours as needed for excoriation.   hydroxypropyl methylcellulose / hypromellose (ISOPTO TEARS / GONIOVISC) 2.5 % ophthalmic solution Place 1 drop into both eyes every 2 (two) hours as needed for dry eyes.  Infant Care Products [facility]) OINT Apply to Left Buttock three times daily.   insulin degludec (TRESIBA FLEXTOUCH) 100 UNIT/ML FlexTouch Pen Inject 5 Units into the skin at bedtime.   Insulin Pen Needle 32G X 4 MM MISC 1 Dose by Does not apply route daily.   Lactase (LACTAID PO) Give one by mouth every shift. Only give with Ensure or dairy products.   latanoprost (XALATAN) 0.005 % ophthalmic solution Place 1 drop into both eyes every evening.   metFORMIN (GLUCOPHAGE) 500 MG tablet TAKE ONE TABLET BY MOUTH TWICE DAILY   morphine (ROXANOL) 20 MG/ML concentrated solution Take 0.25 mLs (5 mg total) by mouth every 4 (four) hours as needed for severe pain, shortness of breath or anxiety.   Multiple Vitamins-Minerals (PRESERVISION AREDS 2 PO) Give one capsule by mouth twice daily.   potassium chloride (KLOR-CON) 10 MEQ tablet TAKE ONE TABLET BY MOUTH TWICE DAILY   sertraline (ZOLOFT) 100 MG tablet Take 100 mg by mouth daily.   Sodium Hypochlorite (ANASEPT ANTIMICROBIAL) 0.057 % GEL Apply to Left heel wound topically every shift for wound care after cleaning wound, apply Anasept gel to wound bed. Then cover with telfa and wrap with kerlix daily.   Sodium Hypochlorite 0.5 % SOLN Apply to Left heel wound topically every shift for wound care Clean wound with dakin's solution prior to dressing change.   No facility-administered encounter medications on file as of 10/27/2022.    Review of Systems  Vitals:   10/27/22 0927 Oct 27, 2022 0928  BP: (!) 86/48  (!) 86/48  Pulse: (!) 44   Resp: 18   Temp: (!) 97.3 F (36.3 C)   SpO2: 98%   Weight: 122 lb 8 oz (55.6 kg)   Height: 5' (1.524 m)    Body mass index is 23.92 kg/m. Physical Exam Constitutional:      Comments: Non-responsive  Cardiovascular:     Comments: No heartbeat, pulsless Pulmonary:     Comments: No respirations Skin:    Comments: Chest warm to the touch, extremities cold     Labs reviewed: Basic Metabolic Panel: Recent Labs    06/21/22 0000 09/16/22 0000  NA 139 135*  K 3.9 4.5  CL 103 100  CO2 25* 25*  BUN 25* 26*  CREATININE 0.9 0.8  CALCIUM 7.9* 8.3*   Liver Function Tests: Recent Labs    06/21/22 0000 09/16/22 0000  AST 29 30  ALT 10 17  ALKPHOS 70 91  ALBUMIN 3.4* 2.8*   No results for input(s): "LIPASE", "AMYLASE" in the last 8760 hours. No results for input(s): "AMMONIA" in the last 8760 hours. CBC: Recent Labs    06/21/22 0000 09/16/22 0000  WBC 6.3 10.9  NEUTROABS  --  8,469.00  HGB 10.0* 10.1*  HCT 31* 32*  PLT 119* 224   Cardiac Enzymes: No results for input(s): "CKTOTAL", "CKMB", "CKMBINDEX", "TROPONINI" in the last 8760 hours. BNP: Invalid input(s): "POCBNP" CBG: No results for input(s): "GLUCAP" in the last 8760 hours.  Procedures and Imaging Studies During Stay: No results found.  Assessment/Plan:   Moderate vascular dementia without behavioral disturbance, psychotic disturbance, mood disturbance, or anxiety (HCC)  End of life care  Pressure injury of left heel, stage 3 (HCC)  Chronic diastolic heart failure (HCC)  Type 2 diabetes mellitus with other circulatory complications (HCC)  Chronic kidney disease, stage 3a (HCC)  Severe protein-calorie malnutrition (HCC) Patient found to be deceased at 8:30AM 10-27-2022. Her daughter at bedside. Likely cause of death due to  severe protein deficiency in the last 4-6 months leading to weight loss. Patient's remains to be sent ot funeral home at this time. Chaplain and  hospice services offered; declined.   Patient is being discharged to funeral home  I spent greater than 30 minutes for the care of this patient in care coordination.

## 2022-10-11 DEATH — deceased
